# Patient Record
Sex: Male | Born: 1965
Health system: Southern US, Community
[De-identification: ages and names within clinical notes are randomized; demographics above are authoritative.]

## PROBLEM LIST (undated history)

## (undated) DIAGNOSIS — I10 Essential (primary) hypertension: Secondary | ICD-10-CM

## (undated) DIAGNOSIS — Z951 Presence of aortocoronary bypass graft: Secondary | ICD-10-CM

## (undated) DIAGNOSIS — R748 Abnormal levels of other serum enzymes: Secondary | ICD-10-CM

## (undated) DIAGNOSIS — I2510 Atherosclerotic heart disease of native coronary artery without angina pectoris: Secondary | ICD-10-CM

## (undated) DIAGNOSIS — E785 Hyperlipidemia, unspecified: Secondary | ICD-10-CM

## (undated) DIAGNOSIS — R739 Hyperglycemia, unspecified: Secondary | ICD-10-CM

## (undated) DIAGNOSIS — J45909 Unspecified asthma, uncomplicated: Secondary | ICD-10-CM

## (undated) HISTORY — DX: Abnormal levels of other serum enzymes: R74.8

## (undated) HISTORY — PX: TONSILLECTOMY: SUR1361

## (undated) HISTORY — PX: VASECTOMY: SHX75

## (undated) HISTORY — PX: APPENDECTOMY: SHX54

## (undated) HISTORY — DX: Presence of aortocoronary bypass graft: Z95.1

## (undated) HISTORY — PX: WISDOM TOOTH EXTRACTION: SHX21

## (undated) HISTORY — DX: Hyperlipidemia, unspecified: E78.5

## (undated) HISTORY — DX: Hyperglycemia, unspecified: R73.9

---

## 2004-01-14 ENCOUNTER — Encounter: Admission: RE | Admit: 2004-01-14 | Discharge: 2004-01-14 | Payer: Self-pay | Admitting: Internal Medicine

## 2004-01-14 ENCOUNTER — Ambulatory Visit: Payer: Self-pay | Admitting: Internal Medicine

## 2005-01-12 ENCOUNTER — Ambulatory Visit: Payer: Self-pay | Admitting: Internal Medicine

## 2005-01-19 ENCOUNTER — Ambulatory Visit: Payer: Self-pay | Admitting: Internal Medicine

## 2008-10-10 ENCOUNTER — Encounter: Payer: Self-pay | Admitting: Internal Medicine

## 2008-12-05 ENCOUNTER — Ambulatory Visit: Payer: Self-pay | Admitting: Internal Medicine

## 2008-12-05 DIAGNOSIS — E7849 Other hyperlipidemia: Secondary | ICD-10-CM

## 2008-12-05 DIAGNOSIS — J45909 Unspecified asthma, uncomplicated: Secondary | ICD-10-CM

## 2008-12-05 HISTORY — DX: Unspecified asthma, uncomplicated: J45.909

## 2008-12-05 HISTORY — DX: Other hyperlipidemia: E78.49

## 2008-12-05 LAB — CONVERTED CEMR LAB
Cholesterol, target level: 200 mg/dL
HDL goal, serum: 40 mg/dL
LDL Goal: 110 mg/dL

## 2008-12-11 ENCOUNTER — Ambulatory Visit: Payer: Self-pay | Admitting: Internal Medicine

## 2008-12-11 LAB — CONVERTED CEMR LAB: Hgb A1c MFr Bld: 5.2 % (ref 4.6–6.5)

## 2008-12-12 ENCOUNTER — Encounter (INDEPENDENT_AMBULATORY_CARE_PROVIDER_SITE_OTHER): Payer: Self-pay | Admitting: *Deleted

## 2008-12-15 ENCOUNTER — Encounter: Payer: Self-pay | Admitting: Internal Medicine

## 2008-12-25 ENCOUNTER — Telehealth (INDEPENDENT_AMBULATORY_CARE_PROVIDER_SITE_OTHER): Payer: Self-pay | Admitting: *Deleted

## 2009-01-01 ENCOUNTER — Ambulatory Visit: Payer: Self-pay | Admitting: Internal Medicine

## 2009-01-01 LAB — CONVERTED CEMR LAB: LDL Goal: 135 mg/dL

## 2009-04-03 ENCOUNTER — Ambulatory Visit: Payer: Self-pay | Admitting: Internal Medicine

## 2009-04-06 LAB — CONVERTED CEMR LAB
ALT: 101 units/L — ABNORMAL HIGH (ref 0–53)
AST: 51 units/L — ABNORMAL HIGH (ref 0–37)
Albumin: 4.5 g/dL (ref 3.5–5.2)
Alkaline Phosphatase: 57 units/L (ref 39–117)
Bilirubin, Direct: 0.1 mg/dL (ref 0.0–0.3)
Cholesterol: 276 mg/dL — ABNORMAL HIGH (ref 0–200)
Direct LDL: 192.6 mg/dL
HDL: 59.3 mg/dL (ref >39.00)
Total Bilirubin: 1.2 mg/dL (ref 0.3–1.2)
Total CHOL/HDL Ratio: 5
Total Protein: 7.5 g/dL (ref 6.0–8.3)
Triglycerides: 174 mg/dL — ABNORMAL HIGH (ref 0.0–149.0)
VLDL: 34.8 mg/dL (ref 0.0–40.0)

## 2009-04-10 ENCOUNTER — Ambulatory Visit: Payer: Self-pay | Admitting: Internal Medicine

## 2009-04-10 DIAGNOSIS — R740 Nonspecific elevation of levels of transaminase and lactic acid dehydrogenase [LDH]: Secondary | ICD-10-CM

## 2009-05-11 ENCOUNTER — Telehealth (INDEPENDENT_AMBULATORY_CARE_PROVIDER_SITE_OTHER): Payer: Self-pay | Admitting: *Deleted

## 2009-05-27 ENCOUNTER — Encounter: Payer: Self-pay | Admitting: Internal Medicine

## 2009-05-28 ENCOUNTER — Telehealth (INDEPENDENT_AMBULATORY_CARE_PROVIDER_SITE_OTHER): Payer: Self-pay | Admitting: *Deleted

## 2010-04-06 NOTE — Progress Notes (Signed)
Summary: -refill  Phone Note Call from Patient   Caller: Patient email Summary of Call: Email states: dr hopper will you issue a 90-day prescription to [facility] for singular please? Local pharmacy charging 39 per month and can save a few bucks with mail order.  per dr hopper singulair 10 mg #90 3 refills  Follow-up for Phone Call        left message to call  office..........Marland KitchenFelecia Deloach CMA  May 11, 2009 9:41 AM  left message to call  office............Marland KitchenFelecia Deloach CMA  May 11, 2009 3:07 PM  left message to call  office.................Marland KitchenFelecia Deloach CMA  May 12, 2009 8:25 AM   pt called back awaiting return call to verify pharmacy..........Marland KitchenFelecia Deloach CMA  May 13, 2009 11:19 AM                   Additional Follow-up for Phone Call Additional follow up Details #1::        pt called back to [facility]pt aware rx sent to pharmacy..............Marland KitchenFelecia Deloach CMA  May 13, 2009 2:56 PM     New/Updated Medications: SINGULAIR 10 MG TABS (MONTELUKAST SODIUM) take 1 tab once daily Prescriptions: SINGULAIR 10 MG TABS (MONTELUKAST SODIUM) take 1 tab once daily  #90 x 3   Entered by:   Jeremy Johann CMA   Authorized by:   Marga Melnick MD   Signed by:   Jeremy Johann CMA on 05/13/2009   Method used:   Faxed to ...       [facility] Environmental education officer)       [address] [address]       [place], [place]  [address]       Ph: [phone]       Fax: [phone]   RxID:   [id number]

## 2010-04-06 NOTE — Assessment & Plan Note (Signed)
Summary: to go over labs/kdc   Vital Signs:  Patient profile:   45 year old male Weight:      192.6 pounds Pulse rate:   72 / minute Resp:     12 per minute BP sitting:   120 / 88  (left arm) Cuff size:   large  Vitals Entered By: Shonna Chock (April 10, 2009 2:47 PM) CC: Follow-up visit: discuss labs  Comments REVIEWED MED LIST, PATIENT AGREED DOSE AND INSTRUCTION CORRECT    CC:  Follow-up visit: discuss labs .  History of Present Illness: Labs reviewed & risks discussed; TG 174 , AST 51, ALT 101, & LDL 192.6. No specific diet; decreased CVE. No PMH of hepatitis. No excess Tylenol  or vitamin A. 4 beers/ day.  Allergies (verified): No Known Drug Allergies  Past History:  Past Medical History: Hyperlipidemia: NMR: LDL 161(0960/454), TG 97, HDL 63. LDL goal = < 135, < 100 ideally Fasting hyperglycemia  Asthma, PMHN of  Review of Systems CV:  Denies chest pain or discomfort, leg cramps with exertion, palpitations, and shortness of breath with exertion. GI:  Denies yellowish skin color. GU:  No coke colored coke.  Physical Exam  General:  well-nourished,alert,appropriate and cooperative throughout examination Eyes:  No  icterus Heart:  Normal rate and regular rhythm. S1 and S2 normal without gallop, murmur, click, rub. S4 with slurring Abdomen:  Bowel sounds positive,abdomen soft and non-tender without masses, organomegaly or hernias noted. Pulses:  R and L carotid,radial,dorsalis pedis and posterior tibial pulses are full and equal bilaterally Skin:  No jaundice   Impression & Recommendations:  Problem # 1:  HYPERLIPIDEMIA (ICD-272.4)  The following medications were removed from the medication list:    Pravastatin Sodium 20 Mg Tabs (Pravastatin sodium) .Marland Kitchen... 1 at bedtime  Problem # 2:  NONSPEC ELEVATION OF LEVELS OF TRANSAMINASE/LDH (ICD-790.4)  Patient Instructions: 1)  Consume LESS THAN 40 grams of sugar / day from foods & drinks with High Fructose Corn  Syrup as #1,2 or #3 on label. 2)  Please schedule a follow-up  fasting lab appointment in 4 months. 3)  Hepatic Panel ;Lipid Panel . LDL goal = < 135

## 2010-04-06 NOTE — Progress Notes (Signed)
Summary: prior auth APPROVED [facility]  Phone Note Refill Request Message from:  Pharmacy  Refills Requested: Medication #1:  SINGULAIR 10 MG TABS take 1 tab once daily. prior auth [id number]..............Marland KitchenFelecia Deloach CMA  May 28, 2009 9:07 AM  prior auth approved 05-27-09 until 05-27-10, approval letter scan to chart...............Marland KitchenFelecia Deloach CMA  May 28, 2009 9:08 AM

## 2010-04-06 NOTE — Medication Information (Signed)
Summary: Approval for Singulair/[facility]  Approval for Singulair/[facility]   Imported By: Lanelle Bal 06/06/2009 11:20:27  _____________________________________________________________________  External Attachment:    Type:   Image     Comment:   External Document

## 2011-01-05 ENCOUNTER — Encounter: Payer: Self-pay | Admitting: Internal Medicine

## 2011-01-06 ENCOUNTER — Encounter: Payer: Self-pay | Admitting: Internal Medicine

## 2011-01-06 ENCOUNTER — Ambulatory Visit (INDEPENDENT_AMBULATORY_CARE_PROVIDER_SITE_OTHER): Payer: 59 | Admitting: Internal Medicine

## 2011-01-06 VITALS — BP 122/84 | HR 85 | Temp 98.2°F | Resp 14 | Ht 73.0 in | Wt 178.1 lb

## 2011-01-06 DIAGNOSIS — J45909 Unspecified asthma, uncomplicated: Secondary | ICD-10-CM

## 2011-01-06 DIAGNOSIS — Z23 Encounter for immunization: Secondary | ICD-10-CM

## 2011-01-06 DIAGNOSIS — E785 Hyperlipidemia, unspecified: Secondary | ICD-10-CM

## 2011-01-06 DIAGNOSIS — Z136 Encounter for screening for cardiovascular disorders: Secondary | ICD-10-CM

## 2011-01-06 DIAGNOSIS — Z0000 Encounter for general adult medical examination without abnormal findings: Secondary | ICD-10-CM

## 2011-01-06 LAB — CBC WITH DIFFERENTIAL/PLATELET
Basophils Absolute: 0.1 10*3/uL (ref 0.0–0.1)
Basophils Relative: 1 % (ref 0.0–3.0)
Eosinophils Absolute: 0.1 10*3/uL (ref 0.0–0.7)
Eosinophils Relative: 1.7 % (ref 0.0–5.0)
HCT: 48.2 % (ref 39.0–52.0)
Hemoglobin: 16.6 g/dL (ref 13.0–17.0)
Lymphocytes Relative: 25.2 % (ref 12.0–46.0)
Lymphs Abs: 1.7 10*3/uL (ref 0.7–4.0)
MCHC: 34.5 g/dL (ref 30.0–36.0)
MCV: 95.1 fl (ref 78.0–100.0)
Monocytes Absolute: 0.4 10*3/uL (ref 0.1–1.0)
Monocytes Relative: 6.4 % (ref 3.0–12.0)
Neutro Abs: 4.5 10*3/uL (ref 1.4–7.7)
Neutrophils Relative %: 65.7 % (ref 43.0–77.0)
Platelets: 252 10*3/uL (ref 150.0–400.0)
RBC: 5.07 Mil/uL (ref 4.22–5.81)
RDW: 12.8 % (ref 11.5–14.6)
WBC: 6.9 10*3/uL (ref 4.5–10.5)

## 2011-01-06 LAB — HEPATIC FUNCTION PANEL
ALT: 34 U/L (ref 0–53)
AST: 28 U/L (ref 0–37)
Albumin: 4.8 g/dL (ref 3.5–5.2)
Alkaline Phosphatase: 64 U/L (ref 39–117)
Bilirubin, Direct: 0 mg/dL (ref 0.0–0.3)
Total Bilirubin: 1.2 mg/dL (ref 0.3–1.2)
Total Protein: 8 g/dL (ref 6.0–8.3)

## 2011-01-06 LAB — BASIC METABOLIC PANEL
BUN: 10 mg/dL (ref 6–23)
CO2: 24 mEq/L (ref 19–32)
Calcium: 9.4 mg/dL (ref 8.4–10.5)
Chloride: 104 mEq/L (ref 96–112)
Creatinine, Ser: 0.7 mg/dL (ref 0.4–1.5)
GFR: 129.7 mL/min (ref >60.00)
Glucose, Bld: 92 mg/dL (ref 70–99)
Potassium: 4 mEq/L (ref 3.5–5.1)
Sodium: 139 mEq/L (ref 135–145)

## 2011-01-06 LAB — TSH: TSH: 0.91 u[IU]/mL (ref 0.35–5.50)

## 2011-01-06 NOTE — Progress Notes (Signed)
Subjective:    Patient ID: Tim Strickland, male    DOB: 30-Apr-1965, 45 y.o.   MRN: [id number]  HPI  Tim Strickland  is here for a physical; he has no acute issues.     Review of Systems Dyslipidemia assessment: Prior Advanced Lipid Testing: NMR Lipoprofile.   Family history of premature CAD/ MI: Father & PGF .  Nutrition: heart healthy .  Exercise: not much . Diabetes : no but sister type 1 DM . HTN: no. Smoking history  : no .   Weight :  Down 10 # w/o intervention.  He previously was on statins but these were stopped, presumably because of mild hepatic enzyme rise. It is his impression that his cholesterol actually rose while on a statin ROS: fatigue: no ; chest pain : no ;claudication: no; palpitations: no; abd pain/bowel changes: no ; myalgias:no;  syncope : no ; skin/ hair/ nail  changes: no.  He has a past history of asthma related to environmental exposures. This is quiescent; he uses no maintenance or rescue metered-dose inhalers. He simply avoids triggers which include paint fumes and dust.       Objective:   Physical Exam Gen.: Healthy and well-nourished in appearance. Alert, appropriate and cooperative throughout exam. Head: Normocephalic without obvious abnormalities Eyes: No corneal or conjunctival inflammation noted. Pupils equal round reactive to light and accommodation. Fundal exam is benign without hemorrhages, exudate, papilledema. Extraocular motion intact. Vision grossly normal. Ears: External  ear exam reveals no significant lesions or deformities. Canals clear .TMs normal. Hearing is grossly normal bilaterally. Nose: External nasal exam reveals no deformity or inflammation. Nasal mucosa are pink and moist. No lesions or exudates noted. Septum  Slightly deviated to R  Mouth: Oral mucosa and oropharynx reveal no lesions or exudates. Teeth in good repair. Neck: No deformities, masses, or tenderness noted. Range of motion &  Thyroid normal. Lungs: Normal respiratory  effort; chest expands symmetrically. Lungs are clear to auscultation without rales, wheezes, or increased work of breathing. Heart: Normal rate and rhythm. Normal S1 and S2. No gallop, click, or rub. No  murmur. Abdomen: Bowel sounds normal; abdomen soft and nontender. No masses, organomegaly or hernias noted. Genitalia/DRE: Varicocele is present on the left. Minimal asymmetry of the prostate is noted without nodularity, enlargement, or induration   .                                                                                   Musculoskeletal/extremities: No deformity or scoliosis noted of  the thoracic or lumbar spine. No clubbing, cyanosis, edema, or deformity noted. Range of motion  normal .Tone & strength  normal.Joints normal. Nail health  good. Vascular: Carotid, radial artery, dorsalis pedis and  posterior tibial pulses are full and equal. No bruits present. Neurologic: Alert and oriented x3. Deep tendon reflexes symmetrical and normal.         Skin: Intact without suspicious lesions or rashes. Lymph: No cervical, axillary, or inguinal lymphadenopathy present. Psych: Mood and affect are normal. Normally interactive  Assessment & Plan:  #1 comprehensive physical exam; no acute findings #2 see Problem List with Assessments & Recommendations  My major concern is the possible risk of premature coronary disease based on his family history and the NMR lipoprofile. Additional advanced testing is indicated to assess need for a statin and to define the optimal statin based on ability to metabolize such in view of the past history of elevated liver enzymes. Plan: see Orders

## 2011-01-06 NOTE — Patient Instructions (Signed)
Preventive Health Care: Exercise at least 30-45 minutes a day,  3-4 days a week.  Eat a low-fat diet with lots of fruits and vegetables, up to 7-9 servings per day. Consume less than 40 grams of sugar per day from foods & drinks with High Fructose Corn Sugar as # 1,2,3 or # 4 on label. Alcohol If you drink, do it moderately,less than 9 drinks per week, preferably less than 6 @ most. Health Care Power of Attorney & Living Will. Complete if not in place ; these place you in charge of your health care decisions. Please review Dr Gildardo Griffes book Eat, Drink & Be Healthy for dietary cholesterol information.

## 2011-07-01 ENCOUNTER — Ambulatory Visit (INDEPENDENT_AMBULATORY_CARE_PROVIDER_SITE_OTHER): Payer: 59 | Admitting: Internal Medicine

## 2011-07-01 ENCOUNTER — Encounter: Payer: Self-pay | Admitting: Internal Medicine

## 2011-07-01 VITALS — BP 126/88 | HR 75 | Wt 173.0 lb

## 2011-07-01 DIAGNOSIS — Z8249 Family history of ischemic heart disease and other diseases of the circulatory system: Secondary | ICD-10-CM

## 2011-07-01 DIAGNOSIS — R7401 Elevation of levels of liver transaminase levels: Secondary | ICD-10-CM

## 2011-07-01 DIAGNOSIS — E785 Hyperlipidemia, unspecified: Secondary | ICD-10-CM

## 2011-07-01 MED ORDER — ROSUVASTATIN CALCIUM 10 MG PO TABS: ORAL_TABLET | ORAL | Status: DC

## 2011-07-01 NOTE — Patient Instructions (Signed)
Please take enteric-coated aspirin 325 mg daily with breakfast. Please  schedule fasting Labs in 10 weeks : Lipids, hepatic panel. PLEASE BRING THESE INSTRUCTIONS TO FOLLOW UP  LAB APPOINTMENT.This will guarantee correct labs are drawn, eliminating need for repeat blood sampling ( needle sticks ! ). Diagnoses /Codes: 272.4, 995.20

## 2011-07-01 NOTE — Progress Notes (Signed)
  Subjective:    Patient ID: Tim Strickland, male    DOB: 10-Jun-1965, 46 y.o.   MRN: [id number]  HPI His advanced cholesterol panels were reviewed (see Problem List). They suggested his LDL goal should be in the range of 110, ideally less than 80. The genetic testing suggests that he is an intermediate metabolizer. By history he had elevated liver enzymes while on pravastatin at low dose.  Family history is significant for premature coronary disease  He is not engaged in a regular exercise program but is physically active on his job.    Review of Systems  He denies chest pain, palpitations, dyspnea, or claudication.     Objective:   Physical Exam He appears healthy and well-nourished; he is in no acute distress  No carotid bruits are present.  Heart rhythm and rate are normal with no significant murmurs or gallops. S 4  Chest is clear with no increased work of breathing  There is no evidence of aortic aneurysm or renal artery bruits  He has no clubbing or edema.   Pedal pulses are intact   No ischemic skin changes are present         Assessment & Plan:

## 2011-07-01 NOTE — Assessment & Plan Note (Signed)
AST/ALT will be checked. He'll be given a trial of Crestor 10 mg Monday, Wednesday, & Friday with repeat fasting labs in 10 weeks. Aspirin will be recommended. Dr Darnelle Maffucci book was also recommended

## 2011-07-15 ENCOUNTER — Ambulatory Visit (INDEPENDENT_AMBULATORY_CARE_PROVIDER_SITE_OTHER): Payer: 59 | Admitting: Internal Medicine

## 2011-07-15 VITALS — BP 110/74 | HR 72 | Temp 98.0°F | Wt 173.0 lb

## 2011-07-15 DIAGNOSIS — L259 Unspecified contact dermatitis, unspecified cause: Secondary | ICD-10-CM

## 2011-07-15 MED ORDER — PREDNISONE 10 MG PO TABS: ORAL_TABLET | ORAL | Status: DC

## 2011-07-15 NOTE — Assessment & Plan Note (Addendum)
Symptoms most likely due to contact dermatitis. He started Crestor around 2 weeks ago however the symptoms are not consistent with a drug eruption.  Recommend to wash all his clothing, etc See instructions.

## 2011-07-15 NOTE — Patient Instructions (Signed)
Prednisone as prescribed Zyrtec 10 mg over-the-counter one daily. Call anytime if the rash spreads or your eye is affected. Over-the-counter calamine is ok, don't let it close to your eye Poison [facility] ivy is a inflammation of the skin (contact dermatitis) caused by touching the allergens on the leaves of the ivy plant following previous exposure to the plant. The rash usually appears 48 hours after exposure. The rash is usually bumps (papules) or blisters (vesicles) in a linear pattern. Depending on your own sensitivity, the rash may simply cause redness and itching, or it may also progress to blisters which may break open. These must be well cared for to prevent secondary bacterial (germ) infection, followed by scarring. Keep any open areas dry, clean, dressed, and covered with an antibacterial ointment if needed. The eyes may also get puffy. The puffiness is worst in the morning and gets better as the day progresses. This dermatitis usually heals without scarring, within 2 to 3 weeks without treatment. HOME CARE INSTRUCTIONS  Thoroughly wash with soap and water as soon as you have been exposed to poison ivy. You have about one half hour to remove the plant resin before it will cause the rash. This washing will destroy the oil or antigen on the skin that is causing, or will cause, the rash. Be sure to wash under your fingernails as any plant resin there will continue to spread the rash. Do not rub skin vigorously when washing affected area. Poison ivy cannot spread if no oil from the plant remains on your body. A rash that has progressed to weeping sores will not spread the rash unless you have not washed thoroughly. It is also important to wash any clothes you have been wearing as these may carry active allergens. The rash will return if you wear the unwashed clothing, even several days later. Avoidance of the plant in the future is the best measure. Poison ivy plant can be recognized by the number of  leaves. Generally, poison ivy has three leaves with flowering branches on a single stem. Diphenhydramine may be purchased over the counter and used as needed for itching. Do not drive with this medication if it makes you drowsy.Ask your caregiver about medication for children. SEEK MEDICAL CARE IF:  Open sores develop.   Redness spreads beyond area of rash.   You notice purulent (pus-like) discharge.   You have increased pain.   Other signs of infection develop (such as fever).  Document Released: 02/19/2000 Document Revised: 02/10/2011 Document Reviewed: 01/07/2009 [facility] Patient Information 2012 [place], [place].

## 2011-07-15 NOTE — Progress Notes (Signed)
  Subjective:    Patient ID: Tim Strickland, male    DOB: 04-12-1965, 46 y.o.   MRN: [id number]  HPI Acute visit Shortly after he work in the yard he developed a itchy rash of the hands and face. History of poison ivy before, he thinks that what it is. Symptoms started 2 nights ago.  Past Medical History  Diagnosis Date  . Asthma   . Hyperlipidemia   . Hyperglycemia     A1c 5.2% in 2010  . Elevated liver enzymes     PMH of     Review of Systems He is taking Crestor, he started 2 weeks ago.  Denies any fever chills Lips and tongue  are not affected. He in general feels well.     Objective:   Physical Exam  Constitutional: He appears well-developed and well-nourished. No distress.  HENT:  Head:    Skin: He is not diaphoretic.      Areas of macular  Erythema, see graphic. The most affected area is the left face. Eye itself does not seem affected     Assessment & Plan:

## 2011-07-17 ENCOUNTER — Encounter: Payer: Self-pay | Admitting: Internal Medicine

## 2011-08-09 ENCOUNTER — Encounter: Payer: Self-pay | Admitting: Internal Medicine

## 2011-09-29 ENCOUNTER — Other Ambulatory Visit (INDEPENDENT_AMBULATORY_CARE_PROVIDER_SITE_OTHER): Payer: 59

## 2011-09-29 DIAGNOSIS — E785 Hyperlipidemia, unspecified: Secondary | ICD-10-CM

## 2011-09-29 DIAGNOSIS — T887XXA Unspecified adverse effect of drug or medicament, initial encounter: Secondary | ICD-10-CM

## 2011-09-29 LAB — LIPID PANEL
Cholesterol: 222 mg/dL — ABNORMAL HIGH (ref 0–200)
HDL: 63.2 mg/dL (ref >39.00)
Total CHOL/HDL Ratio: 4
Triglycerides: 161 mg/dL — ABNORMAL HIGH (ref 0.0–149.0)
VLDL: 32.2 mg/dL (ref 0.0–40.0)

## 2011-09-29 LAB — HEPATIC FUNCTION PANEL
ALT: 28 U/L (ref 0–53)
AST: 23 U/L (ref 0–37)
Albumin: 4.5 g/dL (ref 3.5–5.2)
Alkaline Phosphatase: 55 U/L (ref 39–117)
Bilirubin, Direct: 0 mg/dL (ref 0.0–0.3)
Total Bilirubin: 0.7 mg/dL (ref 0.3–1.2)
Total Protein: 7.4 g/dL (ref 6.0–8.3)

## 2011-09-29 LAB — LDL CHOLESTEROL, DIRECT: Direct LDL: 135.3 mg/dL

## 2011-09-29 NOTE — Progress Notes (Signed)
Labs only

## 2011-12-20 ENCOUNTER — Other Ambulatory Visit: Payer: Self-pay

## 2011-12-20 MED ORDER — ALBUTEROL SULFATE HFA 108 (90 BASE) MCG/ACT IN AERS: 2.0000 | INHALATION_SPRAY | Freq: Four times a day (QID) | RESPIRATORY_TRACT | Status: DC | PRN

## 2011-12-20 MED ORDER — MONTELUKAST SODIUM 10 MG PO TABS: 10.0000 mg | ORAL_TABLET | Freq: Every day | ORAL | Status: DC

## 2011-12-20 NOTE — Telephone Encounter (Signed)
Both OK x 6 months

## 2011-12-20 NOTE — Telephone Encounter (Signed)
OV 07/15/11. Both meds are historical meds don't see that you filled before. Plz advise     MW

## 2012-12-24 ENCOUNTER — Telehealth: Payer: Self-pay

## 2012-12-24 NOTE — Telephone Encounter (Signed)
Medication List and allergies: reviewed  90 day supply/mail order: none Local prescriptions: [facility] [address] [facility] [address]  Immunizations due: Needs flu and tdap vaccines; admin bohth  A/P:   No famliy hx or surgical hx changes Will fast for lab work   To Discuss with Provider: Nothing at this time

## 2012-12-26 ENCOUNTER — Encounter: Payer: Self-pay | Admitting: Internal Medicine

## 2012-12-26 ENCOUNTER — Ambulatory Visit (INDEPENDENT_AMBULATORY_CARE_PROVIDER_SITE_OTHER): Payer: 59 | Admitting: Internal Medicine

## 2012-12-26 VITALS — BP 117/85 | HR 76 | Temp 97.8°F | Resp 16 | Ht 74.0 in | Wt 183.4 lb

## 2012-12-26 DIAGNOSIS — E785 Hyperlipidemia, unspecified: Secondary | ICD-10-CM

## 2012-12-26 DIAGNOSIS — Z0000 Encounter for general adult medical examination without abnormal findings: Secondary | ICD-10-CM

## 2012-12-26 DIAGNOSIS — Z23 Encounter for immunization: Secondary | ICD-10-CM

## 2012-12-26 LAB — TSH: TSH: 1.2 u[IU]/mL (ref 0.35–5.50)

## 2012-12-26 LAB — BASIC METABOLIC PANEL
BUN: 12 mg/dL (ref 6–23)
CO2: 28 mEq/L (ref 19–32)
Calcium: 9.4 mg/dL (ref 8.4–10.5)
Chloride: 101 mEq/L (ref 96–112)
Creatinine, Ser: 1 mg/dL (ref 0.4–1.5)
GFR: 89.3 mL/min (ref >60.00)
Glucose, Bld: 100 mg/dL — ABNORMAL HIGH (ref 70–99)
Potassium: 4.5 mEq/L (ref 3.5–5.1)
Sodium: 138 mEq/L (ref 135–145)

## 2012-12-26 LAB — LIPID PANEL
Cholesterol: 286 mg/dL — ABNORMAL HIGH (ref 0–200)
HDL: 63.3 mg/dL (ref >39.00)
Total CHOL/HDL Ratio: 5
Triglycerides: 168 mg/dL — ABNORMAL HIGH (ref 0.0–149.0)
VLDL: 33.6 mg/dL (ref 0.0–40.0)

## 2012-12-26 LAB — CBC WITH DIFFERENTIAL/PLATELET
Basophils Absolute: 0.1 10*3/uL (ref 0.0–0.1)
Basophils Relative: 1 % (ref 0.0–3.0)
Eosinophils Absolute: 0.2 10*3/uL (ref 0.0–0.7)
Eosinophils Relative: 2.6 % (ref 0.0–5.0)
HCT: 46.8 % (ref 39.0–52.0)
Hemoglobin: 16.3 g/dL (ref 13.0–17.0)
Lymphocytes Relative: 24.4 % (ref 12.0–46.0)
Lymphs Abs: 1.9 10*3/uL (ref 0.7–4.0)
MCHC: 35 g/dL (ref 30.0–36.0)
MCV: 92.5 fl (ref 78.0–100.0)
Monocytes Absolute: 0.6 10*3/uL (ref 0.1–1.0)
Monocytes Relative: 7.7 % (ref 3.0–12.0)
Neutro Abs: 5.1 10*3/uL (ref 1.4–7.7)
Neutrophils Relative %: 64.3 % (ref 43.0–77.0)
Platelets: 260 10*3/uL (ref 150.0–400.0)
RBC: 5.06 Mil/uL (ref 4.22–5.81)
RDW: 13.2 % (ref 11.5–14.6)
WBC: 7.9 10*3/uL (ref 4.5–10.5)

## 2012-12-26 LAB — HEPATIC FUNCTION PANEL
ALT: 74 U/L — ABNORMAL HIGH (ref 0–53)
AST: 44 U/L — ABNORMAL HIGH (ref 0–37)
Albumin: 4.3 g/dL (ref 3.5–5.2)
Alkaline Phosphatase: 67 U/L (ref 39–117)
Bilirubin, Direct: 0 mg/dL (ref 0.0–0.3)
Total Bilirubin: 1.2 mg/dL (ref 0.3–1.2)
Total Protein: 7.6 g/dL (ref 6.0–8.3)

## 2012-12-26 LAB — LDL CHOLESTEROL, DIRECT: Direct LDL: 205.1 mg/dL

## 2012-12-26 MED ORDER — MONTELUKAST SODIUM 10 MG PO TABS: 10.0000 mg | ORAL_TABLET | Freq: Every day | ORAL | Status: DC

## 2012-12-26 MED ORDER — ALBUTEROL SULFATE HFA 108 (90 BASE) MCG/ACT IN AERS: 2.0000 | INHALATION_SPRAY | Freq: Four times a day (QID) | RESPIRATORY_TRACT | Status: DC | PRN

## 2012-12-26 NOTE — Patient Instructions (Signed)
Preventive Health Care: Exercise at least 30-45 minutes a day,  3-4 days a week.  Eat a low-fat diet with lots of fruits and vegetables, up to 7-9 servings per day. This would eliminate the need for vitamin supplements. Alcohol If you drink, do it moderately,less than 9 drinks per week, preferably less than 6 @ most. Health Care Power of Attorney & Living Will. Complete these if not in place ; these place you in charge of your health care decisions. Please take enteric-coated aspirin 81 mg daily with breakfast. Use an anti-inflammatory cream such as Aspercreme or Zostrix cream twice a day to the affected area as needed. In lieu of this warm moist compresses or  hot water bottle can be used. Do not apply ice .  If you activate the  My Chart system; lab & Xray results will be released directly  to you as soon as I review & address these through the computer. If you choose not to sign up for My Chart within 36 hours of labs being drawn; results will be reviewed & interpretation added before being copied & mailed, causing a delay in getting the results to you.If you do not receive that report within 7-10 days ,please call. Additionally you can use this system to gain direct  access to your records  if  out of town or @ an office of a  physician who is not in  the My Chart network.  This improves continuity of care & places you in control of your medical record.

## 2012-12-26 NOTE — Progress Notes (Signed)
  Subjective:    Patient ID: Tim Strickland, male    DOB: 20-Oct-1965, 47 y.o.   MRN: [id number]  HPI  He is here for a physical;acute issues intermittent L index finger with weather change. As needed NSAID helped.     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 Specifically he denies chest pain, palpitations, dyspnea, or claudication.  Family history is positive for premature coronary disease. Advanced cholesterol testing reveals his LDL goal is less than 110. There is medication compliance with the statin. Significant abdominal symptoms, memory deficit, or myalgias denied.    Objective:   Physical Exam Gen.: Healthy and well-nourished in appearance. Alert, appropriate and cooperative throughout exam.Appears younger than stated age  Head: Normocephalic without obvious abnormalities; no alopecia  Eyes: No corneal or conjunctival inflammation noted. Pupils equal round reactive to light and accommodation. Fundal exam is benign without hemorrhages, exudate, papilledema. Extraocular motion intact. Ears: External  ear exam reveals no significant lesions or deformities. Canals clear .TMs normal.   Nose: External nasal exam reveals no deformity or inflammation. Nasal mucosa are pink and moist. No lesions or exudates noted.  Mouth: Oral mucosa and oropharynx reveal no lesions or exudates. Teeth in good repair. Neck: No deformities, masses, or tenderness noted. Range of motion & Thyroid normal. Lungs: Normal respiratory effort; chest expands symmetrically. Lungs are clear to auscultation without rales, wheezes, or increased work of breathing. Heart: Normal rate and rhythm. Normal S1 and S2. No gallop, click, or rub. No  murmur. Abdomen: Bowel sounds normal; abdomen soft and nontender. No masses, organomegaly or hernias noted. Genitalia: Genitalia normal except for left varices. Prostate is normal without enlargement, asymmetry,  nodularity, or induration.                                  Musculoskeletal/extremities: No deformity or scoliosis noted of  the thoracic or lumbar spine.  No clubbing, cyanosis, edema, or significant extremity  deformity noted. Range of motion normal .Tone & strength  Normal. Joints  reveal minor DJD DIP changes. Nail health good. Able to lie down & sit up w/o help. Negative SLR bilaterally Vascular: Carotid, radial artery, dorsalis pedis and  posterior tibial pulses are full and equal. No bruits present. Neurologic: Alert and oriented x3. Deep tendon reflexes symmetrical and normal.  Gait normal  including heel & toe walking .        Skin: Intact without suspicious lesions or rashes. Lymph: No cervical, axillary, or inguinal lymphadenopathy present. Psych: Mood and affect are normal. Normally interactive                                                                                        Assessment & Plan:  #1 comprehensive physical exam; no acute findings  Plan: see Orders  & Recommendations

## 2012-12-28 ENCOUNTER — Encounter: Payer: Self-pay | Admitting: General Practice

## 2012-12-28 ENCOUNTER — Other Ambulatory Visit: Payer: Self-pay | Admitting: Internal Medicine

## 2012-12-28 DIAGNOSIS — E785 Hyperlipidemia, unspecified: Secondary | ICD-10-CM

## 2013-01-02 ENCOUNTER — Ambulatory Visit: Payer: 59

## 2013-01-02 DIAGNOSIS — R7309 Other abnormal glucose: Secondary | ICD-10-CM

## 2013-01-02 LAB — HEMOGLOBIN A1C: Hgb A1c MFr Bld: 5.4 % (ref 4.6–6.5)

## 2013-01-03 ENCOUNTER — Encounter: Payer: Self-pay | Admitting: *Deleted

## 2013-01-03 NOTE — Progress Notes (Signed)
Letter mailed to patient.

## 2014-01-07 ENCOUNTER — Encounter: Payer: Self-pay | Admitting: Internal Medicine

## 2014-01-07 ENCOUNTER — Ambulatory Visit (INDEPENDENT_AMBULATORY_CARE_PROVIDER_SITE_OTHER): Payer: 59 | Admitting: Internal Medicine

## 2014-01-07 VITALS — BP 130/100 | HR 83 | Temp 97.6°F | Resp 12 | Ht 73.0 in | Wt 187.1 lb

## 2014-01-07 DIAGNOSIS — S3992XD Unspecified injury of lower back, subsequent encounter: Secondary | ICD-10-CM

## 2014-01-07 DIAGNOSIS — Z23 Encounter for immunization: Secondary | ICD-10-CM

## 2014-01-07 DIAGNOSIS — W19XXXD Unspecified fall, subsequent encounter: Secondary | ICD-10-CM

## 2014-01-07 DIAGNOSIS — Z0189 Encounter for other specified special examinations: Secondary | ICD-10-CM

## 2014-01-07 DIAGNOSIS — E785 Hyperlipidemia, unspecified: Secondary | ICD-10-CM

## 2014-01-07 DIAGNOSIS — M7541 Impingement syndrome of right shoulder: Secondary | ICD-10-CM

## 2014-01-07 DIAGNOSIS — Z0000 Encounter for general adult medical examination without abnormal findings: Secondary | ICD-10-CM

## 2014-01-07 MED ORDER — CYCLOBENZAPRINE HCL 5 MG PO TABS: ORAL_TABLET | ORAL | Status: DC

## 2014-01-07 MED ORDER — PREDNISONE 20 MG PO TABS: 20.0000 mg | ORAL_TABLET | Freq: Two times a day (BID) | ORAL | Status: DC

## 2014-01-07 NOTE — Patient Instructions (Addendum)
Your next office appointment will be determined based upon review of your pending labs. Those instructions will be transmitted to you through My Chart  OR  by mail;whichever process is your choice to receive results & recommendations  Followup as needed for your acute issue. Please report any significant change in your symptoms.The  referral to Dr Tamala Julian will be scheduled and you'll be notified of the time.Please call the Referral Co-Ordinator @ [phone] if you have not been notified of appointment time within 7-10 days.  Use an anti-inflammatory cream such as Aspercreme or Zostrix cream twice a day to the affected area as needed. In lieu of this warm moist compresses or  hot water bottle can be used. Do not apply ice .

## 2014-01-07 NOTE — Progress Notes (Signed)
Pre visit review using our clinic review tool, if applicable. No additional management support is needed unless otherwise documented below in the visit note. 

## 2014-01-07 NOTE — Progress Notes (Signed)
Subjective:    Patient ID: Tim Strickland, male    DOB: 05/30/1965, 48 y.o.   MRN: [id number]  HPI   He is here for a physical;acute issues include acute mid back and somewhat chronic right shoulder discomfort  On 01/03/14 he tripped over a root while hiking and struck his back on a rock.  Denied were any change in heart rhythm or rate prior to the event. There was no associated chest pain or shortness of breath prior to fall. Also specifically denied prior to the episode were headache, limb weakness, tingling, or numbness. No seizure activity noted. X-rays were performed 01/04/14 @ an urgent care near [place], [place]. No fractures were present. He was placed on a muscle relaxant and combination tramadol and acetaminophen. He has residual muscle spasm. He has no limb numbness, tingling, weakness. He has no loss of control of bladder or bowels.  For the past 6 months he's had decreased lateral elevation of the right shoulder. He notes some crepitus at times intermittently. There was no specific trigger or  injury. This has not been evaluated.  He has a heart healthy diet. He's physically active in [employer] and also hikes. He has no associated cardiopulmonary symptoms with this.      Review of Systems    Chest pain, palpitations, tachycardia, exertional dyspnea, paroxysmal nocturnal dyspnea, claudication or edema are absent.    There is no significant abdominal symptom, memory loss, or muscle pain of the acute back issues       Objective:   Physical Exam     Pertinent or positive findings include: He exhibits the classic "low back crawl" lying down and sitting up from the exam table. He has discomfort in the left inferior thorax in the axillary line as well as posteriorly with chest compression side-to-side. There is a faint ecchymotic area over the left posterior chest. He does tend to splint some with deep inspirations. He is able to fully raise both upper extremities to  parallel the head neck.  Gen.: Healthy and well-nourished in appearance. Alert, appropriate and cooperative throughout exam. Appears younger than stated age  Head: Normocephalic without obvious abnormalities  Eyes: No corneal or conjunctival inflammation noted. Pupils equal round reactive to light and accommodation. Extraocular motion intact. Ears: External  ear exam reveals no significant lesions or deformities. Canals clear .TMs normal. Hearing is grossly normal bilaterally. Nose: External nasal exam reveals no deformity or inflammation. Nasal mucosa are pink and moist. No lesions or exudates noted.   Mouth: Oral mucosa and oropharynx reveal no lesions or exudates. Teeth in good repair. Neck: No deformities, masses, or tenderness noted. Range of motion & Thyroid normal. Lungs: Normal respiratory effort; chest expands symmetrically. Lungs are clear to auscultation without rales, wheezes, or increased work of breathing. Heart: Normal rate and rhythm. Normal S1 and S2. No gallop, click, or rub. No murmur. Abdomen: Bowel sounds normal; abdomen soft and nontender. No masses, organomegaly or hernias noted. Genitalia: Genitalia normal except for bilateral vasectomy scar tissue &  left varices. Prostate is normal without enlargement, asymmetry, nodularity, or induration   Musculoskeletal/extremities: No deformity or scoliosis noted of  the thoracic or lumbar spine.  No clubbing, cyanosis, edema, or significant extremity  deformity noted. Range of motion normal but subjective pain with lateral elevation of RUE.Tone & strength normal. Hand joints normal  Fingernail / toenail health good. Able to lie down & sit up w/o help. Negative SLR bilaterally Vascular: Carotid, radial artery, dorsalis pedis  and  posterior tibial pulses are full and equal. No bruits present. Neurologic: Alert and oriented x3. Deep tendon reflexes symmetrical and normal.  Gait normal  Skin: Intact without suspicious lesions or  rashes. Lymph: No cervical, axillary, or inguinal lymphadenopathy present. Psych: Mood and affect are normal. Normally interactive                                                                                        Assessment & Plan:  #1 comprehensive physical exam #2 acute posterior thoracic muscle skeletal injury in fall  #3 right shoulder impingement syndrome without clinical suspicion for rotator cuff tear  Plan: see Orders  & Recommendations

## 2014-01-10 ENCOUNTER — Encounter: Payer: Self-pay | Admitting: Internal Medicine

## 2014-01-12 ENCOUNTER — Encounter: Payer: Self-pay | Admitting: Internal Medicine

## 2014-01-12 ENCOUNTER — Other Ambulatory Visit: Payer: Self-pay | Admitting: Internal Medicine

## 2014-01-12 DIAGNOSIS — J90 Pleural effusion, not elsewhere classified: Secondary | ICD-10-CM

## 2014-01-15 ENCOUNTER — Other Ambulatory Visit (INDEPENDENT_AMBULATORY_CARE_PROVIDER_SITE_OTHER): Payer: 59

## 2014-01-15 ENCOUNTER — Encounter: Payer: Self-pay | Admitting: Family Medicine

## 2014-01-15 ENCOUNTER — Other Ambulatory Visit: Payer: Self-pay | Admitting: Internal Medicine

## 2014-01-15 ENCOUNTER — Ambulatory Visit (INDEPENDENT_AMBULATORY_CARE_PROVIDER_SITE_OTHER): Payer: 59 | Admitting: Family Medicine

## 2014-01-15 VITALS — BP 118/82 | HR 91 | Ht 73.0 in | Wt 185.0 lb

## 2014-01-15 DIAGNOSIS — M25511 Pain in right shoulder: Secondary | ICD-10-CM

## 2014-01-15 DIAGNOSIS — Z0189 Encounter for other specified special examinations: Secondary | ICD-10-CM

## 2014-01-15 DIAGNOSIS — M7551 Bursitis of right shoulder: Secondary | ICD-10-CM

## 2014-01-15 DIAGNOSIS — Z0000 Encounter for general adult medical examination without abnormal findings: Secondary | ICD-10-CM

## 2014-01-15 HISTORY — DX: Bursitis of right shoulder: M75.51

## 2014-01-15 LAB — CBC WITH DIFFERENTIAL/PLATELET
Basophils Absolute: 0.1 10*3/uL (ref 0.0–0.1)
Basophils Relative: 0.7 % (ref 0.0–3.0)
EOS PCT: 12 % — AB (ref 0.0–5.0)
Eosinophils Absolute: 2 10*3/uL — ABNORMAL HIGH (ref 0.0–0.7)
HCT: 51.8 % (ref 39.0–52.0)
Hemoglobin: 17.5 g/dL — ABNORMAL HIGH (ref 13.0–17.0)
Lymphocytes Relative: 25.1 % (ref 12.0–46.0)
Lymphs Abs: 4.2 10*3/uL — ABNORMAL HIGH (ref 0.7–4.0)
MCHC: 33.8 g/dL (ref 30.0–36.0)
MCV: 93.9 fl (ref 78.0–100.0)
MONO ABS: 1.1 10*3/uL — AB (ref 0.1–1.0)
Monocytes Relative: 6.7 % (ref 3.0–12.0)
NEUTROS PCT: 55.5 % (ref 43.0–77.0)
Neutro Abs: 9.3 10*3/uL — ABNORMAL HIGH (ref 1.4–7.7)
PLATELETS: 430 10*3/uL — AB (ref 150.0–400.0)
RBC: 5.51 Mil/uL (ref 4.22–5.81)
RDW: 13 % (ref 11.5–15.5)
WBC: 16.7 10*3/uL — AB (ref 4.0–10.5)

## 2014-01-15 LAB — BASIC METABOLIC PANEL
BUN: 19 mg/dL (ref 6–23)
CALCIUM: 9.1 mg/dL (ref 8.4–10.5)
CO2: 27 mEq/L (ref 19–32)
CREATININE: 1.1 mg/dL (ref 0.4–1.5)
Chloride: 100 mEq/L (ref 96–112)
GFR: 79.29 mL/min (ref >60.00)
Glucose, Bld: 94 mg/dL (ref 70–99)
Potassium: 4.2 mEq/L (ref 3.5–5.1)
Sodium: 138 mEq/L (ref 135–145)

## 2014-01-15 LAB — HEPATIC FUNCTION PANEL
ALT: 54 U/L — ABNORMAL HIGH (ref 0–53)
AST: 24 U/L (ref 0–37)
Albumin: 3.6 g/dL (ref 3.5–5.2)
Alkaline Phosphatase: 103 U/L (ref 39–117)
BILIRUBIN TOTAL: 0.9 mg/dL (ref 0.2–1.2)
Bilirubin, Direct: 0.1 mg/dL (ref 0.0–0.3)
Total Protein: 7.4 g/dL (ref 6.0–8.3)

## 2014-01-15 LAB — TSH: TSH: 3.69 u[IU]/mL (ref 0.35–4.50)

## 2014-01-15 NOTE — Patient Instructions (Signed)
Good to see you Ice 20 minutes 2 times daily. Usually after activity and before bed. Exercises 3 times a week.  Pennsaid up to 2 times daily See me again in 2 weeks.

## 2014-01-15 NOTE — Assessment & Plan Note (Signed)
Patient was given injection today and did tolerate the procedure very well. Patient still had some mild pain after the procedure which would be a little concern for patient having some possible labral pathology. On exam today to labral testing seemed to be unremarkable. We discussed an icing protocol and patient was showed home exercises. Patient was given a trial of topical anti-inflammatories. Patient is going to try these interventions and come back and see me again in 3 weeks. I'm optimistic he will do well with conservative therapy.

## 2014-01-15 NOTE — Progress Notes (Signed)
[facility] [address], [place] [address] Phone: [phone] Subjective:    I'm seeing this patient by the request  of:  Unice Cobble, MD   CC: right shoulder pain  EHM:[id number] Tim Strickland is a 48 y.o. male coming in with complaint of right shoulder pain. Patient is doing the renovation on a house and has been doing significantly more rapid repetitive motions and knees used to. Patient states that he has pain when he does certain range of motion such as reaching above his head or reaching out from his body in abduction. Patient denies any radiation down the arm or any numbness. Patient states that it is a dull throbbing aching pain that does not respond over-the-counter medicines. Patient puts the severity of 7 out of 10. Does not hurt him with regular daily activities. Patient states that this is his livelihood though and needs to continue working.     Past medical history, social, surgical and family history all reviewed in electronic medical record.   Review of Systems: No headache, visual changes, nausea, vomiting, diarrhea, constipation, dizziness, abdominal pain, skin rash, fevers, chills, night sweats, weight loss, swollen lymph nodes, body aches, joint swelling, muscle aches, chest pain, shortness of breath, mood changes.   Objective Blood pressure 118/82, pulse 91, height 6\' 1"  (1.854 m), weight 185 lb (83.915 kg), SpO2 97 %.  General: No apparent distress alert and oriented x3 mood and affect normal, dressed appropriately.  HEENT: Pupils equal, extraocular movements intact  Respiratory: Patient's speak in full sentences and does not appear short of breath  Cardiovascular: No lower extremity edema, non tender, no erythema  Skin: Warm dry intact with no signs of infection or rash on extremities or on axial skeleton.  Abdomen: Soft nontender  Neuro: Cranial nerves II through XII are intact, neurovascularly intact in all extremities  with 2+ DTRs and 2+ pulses.  Lymph: No lymphadenopathy of posterior or anterior cervical chain or axillae bilaterally.  Gait normal with good balance and coordination.  MSK:  Non tender with full range of motion and good stability and symmetric strength and tone of  elbows, wrist, hip, knee and ankles bilaterally.  Shoulder: Right Inspection reveals no abnormalities, atrophy or asymmetry. Palpation is normal with no tenderness over AC joint or bicipital groove. ROM is full in all planes passively. Rotator cuff strength normal throughout. signs of impingement with positive Neer and Hawkin's tests, but negative empty can sign. Speeds and Yergason's tests normal. No labral pathology noted with negative Obrien's, negative clunk and good stability. Normal scapular function observed. No painful arc and no drop arm sign. No apprehension sign Contralateral shoulder unremarkable  MSK US performed of: Right This study was ordered, performed, and interpreted by Charlann Boxer D.O.  Shoulder:   Supraspinatus:  Appears normal on long and transverse views, Bursal bulge seen with shoulder abduction on impingement view. Infraspinatus:  Appears normal on long and transverse views. Significant increase in Doppler flow Subscapularis:  Appears normal on long and transverse views. Positive bursa Teres Minor:  Appears normal on long and transverse views. AC joint:  Capsule undistended, no geyser sign. Glenohumeral Joint:  Appears normal without effusion. Glenoid Labrum:  Difficult to assess but no true tear appreciated Biceps Tendon:  Appears normal on long and transverse views, no fraying of tendon, tendon located in intertubercular groove, no subluxation with shoulder internal or external rotation.  Impression: Subacromial bursitiswith no significant rotator cuff pathology  Procedure: Real-time Ultrasound  Guided Injection of right glenohumeral joint Device: GE Logiq E  Ultrasound guided injection is  preferred based studies that show increased duration, increased effect, greater accuracy, decreased procedural pain, increased response rate with ultrasound guided versus blind injection.  Verbal informed consent obtained.  Time-out conducted.  Noted no overlying erythema, induration, or other signs of local infection.  Skin prepped in a sterile fashion.  Local anesthesia: Topical Ethyl chloride.  With sterile technique and under real time ultrasound guidance:  Joint visualized.  23g 1  inch needle inserted posterior approach. Pictures taken for needle placement. Patient did have injection of 2 cc of 1% lidocaine, 2 cc of 0.5% Marcaine, and 1.0 cc of Kenalog 40 mg/dL. Completed without difficulty  Pain immediately resolved suggesting accurate placement of the medication.  Advised to call if fevers/chills, erythema, induration, drainage, or persistent bleeding.  Images permanently stored and available for review in the ultrasound unit.  Impression: Technically successful ultrasound guided injection.     Impression and Recommendations:     This case required medical decision making of moderate complexity.

## 2014-01-17 ENCOUNTER — Other Ambulatory Visit: Payer: Self-pay | Admitting: Internal Medicine

## 2014-01-17 DIAGNOSIS — R7989 Other specified abnormal findings of blood chemistry: Secondary | ICD-10-CM

## 2014-01-17 LAB — NMR LIPOPROFILE WITH LIPIDS
CHOLESTEROL, TOTAL: 178 mg/dL (ref 100–199)
HDL PARTICLE NUMBER: 45.6 umol/L (ref >=30.5)
HDL Size: 9.3 nm (ref >=9.2)
HDL-C: 62 mg/dL (ref >39)
LARGE HDL: 9.3 umol/L (ref >=4.8)
LARGE VLDL-P: 14.7 nmol/L — AB (ref <=2.7)
LDL (calc): 69 mg/dL (ref 0–99)
LDL Particle Number: 885 nmol/L (ref <1000)
LDL Size: 20.7 nm (ref >=20.8)
LP-IR Score: 62 — ABNORMAL HIGH (ref <=45)
SMALL LDL PARTICLE NUMBER: 601 nmol/L — AB (ref <=527)
TRIGLYCERIDES: 233 mg/dL — AB (ref 0–149)
VLDL Size: 60.1 nm — ABNORMAL HIGH (ref <=46.6)

## 2014-01-18 ENCOUNTER — Encounter: Payer: Self-pay | Admitting: Internal Medicine

## 2014-01-27 ENCOUNTER — Ambulatory Visit (INDEPENDENT_AMBULATORY_CARE_PROVIDER_SITE_OTHER)
Admission: RE | Admit: 2014-01-27 | Discharge: 2014-01-27 | Disposition: A | Discharge status: Home / Self Care | Payer: 59 | Source: Ambulatory Visit | Attending: Internal Medicine | Admitting: Internal Medicine

## 2014-01-27 ENCOUNTER — Encounter: Payer: Self-pay | Admitting: Internal Medicine

## 2014-01-27 ENCOUNTER — Ambulatory Visit (INDEPENDENT_AMBULATORY_CARE_PROVIDER_SITE_OTHER): Payer: 59 | Admitting: Internal Medicine

## 2014-01-27 ENCOUNTER — Other Ambulatory Visit (INDEPENDENT_AMBULATORY_CARE_PROVIDER_SITE_OTHER): Payer: 59

## 2014-01-27 VITALS — BP 112/82 | HR 84 | Temp 98.2°F | Resp 13 | Wt 183.4 lb

## 2014-01-27 DIAGNOSIS — S298XXS Other specified injuries of thorax, sequela: Secondary | ICD-10-CM

## 2014-01-27 DIAGNOSIS — J90 Pleural effusion, not elsewhere classified: Secondary | ICD-10-CM

## 2014-01-27 DIAGNOSIS — E785 Hyperlipidemia, unspecified: Secondary | ICD-10-CM

## 2014-01-27 DIAGNOSIS — R7989 Other specified abnormal findings of blood chemistry: Secondary | ICD-10-CM

## 2014-01-27 DIAGNOSIS — S299XXS Unspecified injury of thorax, sequela: Secondary | ICD-10-CM

## 2014-01-27 DIAGNOSIS — D72829 Elevated white blood cell count, unspecified: Secondary | ICD-10-CM

## 2014-01-27 LAB — CBC WITH DIFFERENTIAL/PLATELET
BASOS ABS: 0.2 10*3/uL — AB (ref 0.0–0.1)
BASOS PCT: 2 % (ref 0.0–3.0)
EOS ABS: 1.3 10*3/uL — AB (ref 0.0–0.7)
Eosinophils Relative: 10.8 % — ABNORMAL HIGH (ref 0.0–5.0)
HEMATOCRIT: 45.2 % (ref 39.0–52.0)
HEMOGLOBIN: 15.6 g/dL (ref 13.0–17.0)
LYMPHS ABS: 2.9 10*3/uL (ref 0.7–4.0)
Lymphocytes Relative: 23.8 % (ref 12.0–46.0)
MCHC: 34.6 g/dL (ref 30.0–36.0)
MCV: 92.5 fl (ref 78.0–100.0)
MONO ABS: 0.8 10*3/uL (ref 0.1–1.0)
Monocytes Relative: 6.8 % (ref 3.0–12.0)
NEUTROS ABS: 6.9 10*3/uL (ref 1.4–7.7)
Neutrophils Relative %: 56.6 % (ref 43.0–77.0)
Platelets: 363 10*3/uL (ref 150.0–400.0)
RBC: 4.89 Mil/uL (ref 4.22–5.81)
RDW: 12.9 % (ref 11.5–15.5)
WBC: 12.1 10*3/uL — ABNORMAL HIGH (ref 4.0–10.5)

## 2014-01-27 NOTE — Progress Notes (Signed)
   Subjective:    Patient ID: Tim Strickland, male    DOB: 1966-02-24, 48 y.o.   MRN: [id number]  HPI  He was here to follow-up on his pleural effusion which resulted from trauma while hiking. He also was to reassess his white count. Unfortunately he did not understand these orders were in lab and x-ray facility & did not need an appointment  He also is here to follow-up on his NMR LipoProfile.  He's been taking the Crestor 5 mg. Irregularly. Some weeks he would not take it all.  Despite this his LDL was 69 with an LDL particle #885 & small dense # 601; these are ideal.  Unfortunately his triglycerides have risen from 168 up to 233.     Review of Systems   He continues to have some chest discomfort which limits full inspiration but is markedly better.  He denies any sputum production, hemoptysis, or shortness of breath this time  He is not having fever chills, sweats.     Objective:   Physical Exam General appearance is one of good health and nourishment w/o distress.  Eyes: No conjunctival inflammation or scleral icterus is present.  Oral exam: Dental hygiene is good; lips and gums are healthy appearing.There is no oropharyngeal erythema or exudate noted.   Heart:  Normal rate and regular rhythm. S1 and S2 normal without gallop, murmur, click, rub or other extra sounds     Lungs:Chest clear to auscultation; no wheezes, rhonchi,rales ,or rubs present.No increased work of breathing.   Abdomen: bowel sounds normal, soft and non-tender without masses, organomegaly or hernias noted.  No guarding or rebound . No tenderness over the flanks to percussion  Musculoskeletal: Able to lie flat and sit up without help. Negative straight leg raising bilaterally. Gait normal  Skin:Warm & dry.  Intact without suspicious lesions or rashes ; no jaundice or tenting  Lymphatic: No lymphadenopathy is noted about the head, neck, axilla               Assessment & Plan:  #1  hyperlipidemia #2 post chest trauma #3 leukocytosis See orders

## 2014-01-27 NOTE — Assessment & Plan Note (Signed)
Lipids off Crestor in 4 months Avoid HFCS

## 2014-01-27 NOTE — Patient Instructions (Signed)
Please  get fasting Labs in 4 months; CBC & dif and CXray today Please  blowup at least 5-10  balloons a day to enhance inflation of the lungs and prevent atelectasis as we discussed.

## 2014-01-27 NOTE — Progress Notes (Signed)
Pre visit review using our clinic review tool, if applicable. No additional management support is needed unless otherwise documented below in the visit note. 

## 2014-01-28 LAB — LIPID PANEL
Cholesterol: 205 mg/dL — ABNORMAL HIGH (ref 0–200)
HDL: 64.1 mg/dL (ref >39.00)
LDL Cholesterol: 110 mg/dL — ABNORMAL HIGH (ref 0–99)
NONHDL: 140.9
Total CHOL/HDL Ratio: 3
Triglycerides: 154 mg/dL — ABNORMAL HIGH (ref 0.0–149.0)
VLDL: 30.8 mg/dL (ref 0.0–40.0)

## 2014-02-05 ENCOUNTER — Encounter: Payer: Self-pay | Admitting: Family Medicine

## 2014-02-05 ENCOUNTER — Ambulatory Visit (INDEPENDENT_AMBULATORY_CARE_PROVIDER_SITE_OTHER): Payer: 59 | Admitting: Family Medicine

## 2014-02-05 VITALS — BP 112/74 | HR 85 | Ht 73.0 in | Wt 184.0 lb

## 2014-02-05 DIAGNOSIS — M7551 Bursitis of right shoulder: Secondary | ICD-10-CM

## 2014-02-05 NOTE — Patient Instructions (Signed)
Call York Cerise roland on the charge.  Keep working on what you are doing. New exercises for strengthening now  Ice after activity Give it about 4-6 weeks and then if still not perfect come back and see me.

## 2014-02-05 NOTE — Progress Notes (Signed)
  [facility] [address], [place] [address] Phone: [phone] Subjective:     CC: right shoulder pain follow up  Tim Strickland is a 48 y.o. male coming in with complaint of right shoulder pain. Patient was seen previously and had more of a subacromial bursitis of the right shoulder. Patient was given a corticosteroid injection. Patient states that this made minimal improvement. Patient denies any weakness but states with 1 certain movement and abduction he has some discomfort mostly over the deltoid muscle. Patient states the shoulder itself still feels fairly good. Patient feels like this is more of a bone spur giving him the discomfort. Patient states that this is not stopping him from any activities at this time. Patient states that he is sleeping comfortably. Only notices it if he tries to do multiple pushups in a row.     Past medical history, social, surgical and family history all reviewed in electronic medical record.   Review of Systems: No headache, visual changes, nausea, vomiting, diarrhea, constipation, dizziness, abdominal pain, skin rash, fevers, chills, night sweats, weight loss, swollen lymph nodes, body aches, joint swelling, muscle aches, chest pain, shortness of breath, mood changes.   Objective Blood pressure 112/74, weight 184 lb (83.462 kg).  General: No apparent distress alert and oriented x3 mood and affect normal, dressed appropriately.  HEENT: Pupils equal, extraocular movements intact  Respiratory: Patient's speak in full sentences and does not appear short of breath  Cardiovascular: No lower extremity edema, non tender, no erythema  Skin: Warm dry intact with no signs of infection or rash on extremities or on axial skeleton.  Abdomen: Soft nontender  Neuro: Cranial nerves II through XII are intact, neurovascularly intact in all extremities with 2+ DTRs and 2+ pulses.  Lymph: No lymphadenopathy of posterior  or anterior cervical chain or axillae bilaterally.  Gait normal with good balance and coordination.  MSK:  Non tender with full range of motion and good stability and symmetric strength and tone of  elbows, wrist, hip, knee and ankles bilaterally.  Shoulder: Right Inspection reveals no abnormalities, atrophy or asymmetry. Palpation is normal with no tenderness over AC joint or bicipital groove. ROM is full in all planes passively. Rotator cuff strength normal throughout. signs of impingement with positive impingement on abduction Speeds and Yergason's tests normal. No labral pathology noted with negative Obrien's, negative clunk and good stability. Normal scapular function observed. No painful arc and no drop arm sign. No apprehension sign Contralateral shoulder unremarkable       Impression and Recommendations:     This case required medical decision making of moderate complexity.

## 2014-02-05 NOTE — Assessment & Plan Note (Signed)
Patient did not have any significant findings on ultrasound except for a subacromial bursitis and was given an injection. Patient presents like an impingement-type syndrome but no spurring noted. Discussed with patient that we can get x-rays which she declined. Patient though states that this is not affecting his daily activities and would like to continue conservative therapy. Patient declined formal physical therapy. Patient will continue doing the regimen he is doing and was given topical anti-inflammatories. Patient will come back and see me again in 4-6 weeks if continuing to have any discomfort.

## 2014-10-13 ENCOUNTER — Other Ambulatory Visit: Payer: Self-pay

## 2014-10-13 MED ORDER — ALBUTEROL SULFATE HFA 108 (90 BASE) MCG/ACT IN AERS: 2.0000 | INHALATION_SPRAY | Freq: Four times a day (QID) | RESPIRATORY_TRACT | Status: DC | PRN

## 2015-01-06 ENCOUNTER — Encounter: Payer: Self-pay | Admitting: Internal Medicine

## 2015-01-06 ENCOUNTER — Ambulatory Visit (INDEPENDENT_AMBULATORY_CARE_PROVIDER_SITE_OTHER): Payer: 59 | Admitting: Internal Medicine

## 2015-01-06 VITALS — BP 100/60 | HR 64 | Temp 98.1°F | Resp 14 | Wt 177.0 lb

## 2015-01-06 DIAGNOSIS — Z23 Encounter for immunization: Secondary | ICD-10-CM

## 2015-01-06 DIAGNOSIS — Z0000 Encounter for general adult medical examination without abnormal findings: Secondary | ICD-10-CM

## 2015-01-06 MED ORDER — MONTELUKAST SODIUM 10 MG PO TABS: 10.0000 mg | ORAL_TABLET | Freq: Every day | ORAL | Status: DC

## 2015-01-06 MED ORDER — ALBUTEROL SULFATE 108 (90 BASE) MCG/ACT IN AEPB: 2.0000 | INHALATION_SPRAY | Freq: Four times a day (QID) | RESPIRATORY_TRACT | Status: DC | PRN

## 2015-01-06 MED ORDER — ALBUTEROL SULFATE HFA 108 (90 BASE) MCG/ACT IN AERS: 2.0000 | INHALATION_SPRAY | Freq: Four times a day (QID) | RESPIRATORY_TRACT | Status: DC | PRN

## 2015-01-06 NOTE — Progress Notes (Signed)
   Subjective:    Patient ID: Tim Strickland, male    DOB: [birth year], 49 y.o.   MRN: [id number]  HPI The patient is here to assess status of active health conditions.  PMH, FH, & Social History reviewed & updated.Change in [place] as recorded includes possible nervous breakdown in maternal grandmother rather than a stroke. Paternal grandfather may have had gout.  He is on no regular medications. He takes generic Singulair and albuterol inhalations on an as-needed basis only.  He denies excess red meat or sodium. He's physically active remodeling. With this he's lost 5-10 pounds.  He has never smoked. He drinks 4-6 beers per day. Standards related to alcohol intake were discussed.   He has intermittent pain in the left large toe, right shoulder, and right wrist. He did have a steroid injection in the right shoulder for bursitis in December 2015.  He also has nocturia once nightly..  Review of Systems  Chest pain, palpitations, tachycardia, exertional dyspnea, paroxysmal nocturnal dyspnea, claudication or edema are absent. No unexplained weight loss, abdominal pain, significant dyspepsia, dysphagia, melena, rectal bleeding, or persistently small caliber stools. Dysuria, pyuria, hematuria, frequency,  or polyuria are denied. Change in hair, skin, nails denied. No bowel changes of constipation or diarrhea. No intolerance to heat or cold.     Objective:   Physical Exam  Pertinent or positive findings include: There is decreased cervical spine range of motion. He has isolated DIP changes in the hands. He has minimal crepitus in the knees. Vasectomy scar tissue is present bilaterally. Prostate is small and soft.  General appearance :adequately nourished; in no distress.  Eyes: No conjunctival inflammation or scleral icterus is present.  Oral exam:  Lips and gums are healthy appearing.There is no oropharyngeal erythema or exudate noted. Dental hygiene is good.  Heart:  Normal rate and  regular rhythm. S1 and S2 normal without gallop, murmur, click, rub or other extra sounds    Lungs:Chest clear to auscultation; no wheezes, rhonchi,rales ,or rubs present.No increased work of breathing.   Abdomen: bowel sounds normal, soft and non-tender without masses, organomegaly or hernias noted.  No guarding or rebound.   Vascular : all pulses equal ; no bruits present.  Skin:Warm & dry.  Intact without suspicious lesions or rashes ; no tenting or jaundice   Lymphatic: No lymphadenopathy is noted about the head, neck, axilla, or inguinal areas.   Neuro: Strength, tone & DTRs normal.     Assessment & Plan:  #1 comprehensive physical exam; no acute findings  Plan: see Orders  & Recommendations

## 2015-01-06 NOTE — Patient Instructions (Signed)
  Your next office appointment will be determined based upon review of your pending labs. Those written interpretation of the lab results and instructions will be transmitted to you by My Chart  Critical results will be called.   Followup as needed for any active or acute issue. Please report any significant change in your symptoms. 

## 2015-01-06 NOTE — Progress Notes (Signed)
Pre visit review using our clinic review tool, if applicable. No additional management support is needed unless otherwise documented below in the visit note. 

## 2015-01-07 ENCOUNTER — Encounter: Payer: Self-pay | Admitting: Internal Medicine

## 2016-02-17 ENCOUNTER — Telehealth: Payer: Self-pay | Admitting: Internal Medicine

## 2016-02-17 NOTE — Telephone Encounter (Signed)
Patient has an appt next week. Asking for a temporary fill of maintenance meds be sent to [facility] on file.

## 2016-02-18 ENCOUNTER — Other Ambulatory Visit: Payer: Self-pay | Admitting: Family

## 2016-02-19 MED ORDER — MONTELUKAST SODIUM 10 MG PO TABS: 10.0000 mg | ORAL_TABLET | Freq: Every day | ORAL | 0 refills | Status: DC

## 2016-02-19 MED ORDER — ALBUTEROL SULFATE 108 (90 BASE) MCG/ACT IN AEPB: 2.0000 | INHALATION_SPRAY | Freq: Four times a day (QID) | RESPIRATORY_TRACT | 0 refills | Status: DC | PRN

## 2016-02-19 NOTE — Telephone Encounter (Signed)
Rx filled

## 2016-02-23 ENCOUNTER — Other Ambulatory Visit (INDEPENDENT_AMBULATORY_CARE_PROVIDER_SITE_OTHER): Payer: BLUE CROSS/BLUE SHIELD

## 2016-02-23 ENCOUNTER — Encounter: Payer: Self-pay | Admitting: Family

## 2016-02-23 ENCOUNTER — Ambulatory Visit (INDEPENDENT_AMBULATORY_CARE_PROVIDER_SITE_OTHER)
Admission: RE | Admit: 2016-02-23 | Discharge: 2016-02-23 | Disposition: A | Discharge status: Home / Self Care | Payer: BLUE CROSS/BLUE SHIELD | Source: Ambulatory Visit | Attending: Family | Admitting: Family

## 2016-02-23 ENCOUNTER — Ambulatory Visit (INDEPENDENT_AMBULATORY_CARE_PROVIDER_SITE_OTHER): Payer: BLUE CROSS/BLUE SHIELD | Admitting: Family

## 2016-02-23 VITALS — BP 148/88 | HR 74 | Temp 97.5°F | Resp 16 | Ht 73.0 in | Wt 174.0 lb

## 2016-02-23 DIAGNOSIS — M778 Other enthesopathies, not elsewhere classified: Secondary | ICD-10-CM | POA: Insufficient documentation

## 2016-02-23 DIAGNOSIS — M65979 Unspecified synovitis and tenosynovitis, unspecified ankle and foot: Secondary | ICD-10-CM

## 2016-02-23 DIAGNOSIS — J4520 Mild intermittent asthma, uncomplicated: Secondary | ICD-10-CM

## 2016-02-23 DIAGNOSIS — M7541 Impingement syndrome of right shoulder: Secondary | ICD-10-CM | POA: Insufficient documentation

## 2016-02-23 DIAGNOSIS — M659 Synovitis and tenosynovitis, unspecified: Secondary | ICD-10-CM | POA: Diagnosis not present

## 2016-02-23 DIAGNOSIS — Z23 Encounter for immunization: Secondary | ICD-10-CM

## 2016-02-23 HISTORY — DX: Impingement syndrome of right shoulder: M75.41

## 2016-02-23 HISTORY — DX: Other enthesopathies, not elsewhere classified: M77.8

## 2016-02-23 HISTORY — DX: Unspecified synovitis and tenosynovitis, unspecified ankle and foot: M65.979

## 2016-02-23 HISTORY — DX: Synovitis and tenosynovitis, unspecified: M65.9

## 2016-02-23 LAB — URIC ACID: URIC ACID, SERUM: 5.2 mg/dL (ref 4.0–7.8)

## 2016-02-23 MED ORDER — DICLOFENAC SODIUM 2 % TD SOLN: 1.0000 "application " | Freq: Two times a day (BID) | TRANSDERMAL | 1 refills | Status: DC | PRN

## 2016-02-23 MED ORDER — ALBUTEROL SULFATE 108 (90 BASE) MCG/ACT IN AEPB: 2.0000 | INHALATION_SPRAY | Freq: Four times a day (QID) | RESPIRATORY_TRACT | 0 refills | Status: DC | PRN

## 2016-02-23 MED ORDER — IBUPROFEN-FAMOTIDINE 800-26.6 MG PO TABS: 1.0000 | ORAL_TABLET | Freq: Three times a day (TID) | ORAL | 1 refills | Status: DC | PRN

## 2016-02-23 MED ORDER — MONTELUKAST SODIUM 10 MG PO TABS: 10.0000 mg | ORAL_TABLET | Freq: Every day | ORAL | 0 refills | Status: DC

## 2016-02-23 NOTE — Assessment & Plan Note (Signed)
Symptoms and exam consistent with left wrist tendinitis that is improved with mobilization at night. Continue with immobilization. Recommend ice regimen with Pennsaid as needed. Follow-up if symptoms do not continually improve.

## 2016-02-23 NOTE — Patient Instructions (Signed)
Thank you for choosing [facility]!  Ice / moist heat x 20 minutes every 2 hours and as needed or following activity  Pennsaid - Approximately 1/2 packet to the affected site twice daily.  Duexis - 1 tablet 3 times per day for the next 5-7 days and then as needed.  Exercises 1-2 times per day as instructed.   Wrist brace at night.   You will receive a call from [facility] or other pharmacy regarding your Pennsaid/Duexis/Vimovo. The medication will be mailed to you and should cost you no more than $10 per item or possibly free depending upon your insurance.   You will receive a call to schedule your imaging, appointment or physical therapy.  Your prescription(s) have been submitted to your pharmacy or been printed and provided for you. Please take as directed and contact our office if you believe you are having problem(s) with the medication(s) or have any questions.  Please stop by the lab on the lower level of the building for your blood work. Your results will be released to [facility] (or called to you) after review, usually within 72 hours after test completion. If any changes need to be made, you will be notified at that same time.  1.) The lab is open from 7:30am to 5:30 pm Monday-Friday 2.) No appointment is necessary 3.) Fasting (if needed) is 6-8 hours after food and drink; black coffee and water are okay   Please stop by radiology on the basement level of the building for your x-rays. Your results will be released to [facility] (or called to you) after review, usually within 72 hours after test completion. If any treatments or changes are necessary, you will be notified at that same time.  If your symptoms worsen or fail to improve, please contact our office for further instruction, or in case of emergency go directly to the emergency room at the closest medical facility.    Shoulder Impingement Syndrome Rehab Ask your health care provider which exercises are safe for you. Do  exercises exactly as told by your health care provider and adjust them as directed. It is normal to feel mild stretching, pulling, tightness, or discomfort as you do these exercises, but you should stop right away if you feel sudden pain or your pain gets worse.Do not begin these exercises until told by your health care provider. Stretching and range of motion exercise This exercise warms up your muscles and joints and improves the movement and flexibility of your shoulder. This exercise also helps to relieve pain and stiffness. Exercise A: Passive horizontal adduction 1. Sit or stand and pull your left / right elbow across your chest, toward your other shoulder. Stop when you feel a gentle stretch in the back of your shoulder and upper arm.  Keep your arm at shoulder height.  Keep your arm as close to your body as you comfortably can. 2. Hold for __________ seconds. 3. Slowly return to the starting position. Repeat __________ times. Complete this exercise __________ times a day. Strengthening exercises These exercises build strength and endurance in your shoulder. Endurance is the ability to use your muscles for a long time, even after they get tired. Exercise B: External rotation, isometric 1. Stand or sit in a doorway, facing the door frame. 2. Bend your left / right elbow and place the back of your wrist against the door frame. Only your wrist should be touching the frame. Keep your upper arm at your side. 3. Gently press your wrist  against the door frame, as if you are trying to push your arm away from your abdomen.  Avoid shrugging your shoulder while you press your hand against the door frame. Keep your shoulder blade tucked down toward the middle of your back. 4. Hold for __________ seconds. 5. Slowly release the tension, and relax your muscles completely before you do the exercise again. Repeat __________ times. Complete this exercise __________ times a day. Exercise C: Internal  rotation, isometric 1. Stand or sit in a doorway, facing the door frame. 2. Bend your left / right elbow and place the inside of your wrist against the door frame. Only your wrist should be touching the frame. Keep your upper arm at your side. 3. Gently press your wrist against the door frame, as if you are trying to push your arm toward your abdomen.  Avoid shrugging your shoulder while you press your hand against the door frame. Keep your shoulder blade tucked down toward the middle of your back. 4. Hold for __________ seconds. 5. Slowly release the tension, and relax your muscles completely before you do the exercise again. Repeat __________ times. Complete this exercise __________ times a day. Exercise D: Scapular protraction, supine 1. Lie on your back on a firm surface. Hold a __________ weight in your left / right hand. 2. Raise your left / right arm straight into the air so your hand is directly above your shoulder joint. 3. Push the weight into the air so your shoulder lifts off of the surface that you are lying on. Do not move your head, neck, or back. 4. Hold for __________ seconds. 5. Slowly return to the starting position. Let your muscles relax completely before you repeat this exercise. Repeat __________ times. Complete this exercise __________ times a day. Exercise E: Scapular retraction 1. Sit in a stable chair without armrests, or stand. 2. Secure an exercise band to a stable object in front of you so the band is at shoulder height. 3. Hold one end of the exercise band in each hand. Your palms should face down. 4. Squeeze your shoulder blades together and move your elbows slightly behind you. Do not shrug your shoulders while you do this. 5. Hold for __________ seconds. 6. Slowly return to the starting position. Repeat __________ times. Complete this exercise __________ times a day. Exercise F: Shoulder extension 1. Sit in a stable chair without armrests, or  stand. 2. Secure an exercise band to a stable object in front of you where the band is above shoulder height. 3. Hold one end of the exercise band in each hand. 4. Straighten your elbows and lift your hands up to shoulder height. 5. Squeeze your shoulder blades together and pull your hands down to the sides of your thighs. Stop when your hands are straight down by your sides. Do not let your hands go behind your body. 6. Hold for __________ seconds. 7. Slowly return to the starting position. Repeat __________ times. Complete this exercise __________ times a day. This information is not intended to replace advice given to you by your health care provider. Make sure you discuss any questions you have with your health care provider. Document Released: 02/21/2005 Document Revised: 10/29/2015 Document Reviewed: 01/24/2015 Elsevier Interactive Patient Education  2017 [employer].

## 2016-02-23 NOTE — Assessment & Plan Note (Signed)
Symptoms and exam consistent with possible capsulitis or synovitis of the MCP joint of the left great toe. Treat conservatively with ice, NSAIDs, and Duexis. Recommend good supportive shoe. Obtain uric acid to rule out gout as he does have significant family history. Continue to monitor.

## 2016-02-23 NOTE — Assessment & Plan Note (Signed)
Asthma is mild, intermittent, and uncomplicated of present time with no nighttime awakenings and irregular albuterol usage. Continue current dosage of albuterol as needed. Continue to monitor.

## 2016-02-23 NOTE — Assessment & Plan Note (Signed)
Symptoms and exam consistent with shoulder impingement and possible bursitis. This is a chronic problem. Given no significant improvements recommend physical therapy. Obtain x-rays for structural evaluation. Start Pennsaid and Duexis. Recommend ice regimen and home exercise therapy. Consider cortisone injection if symptoms worsen or do not improve.

## 2016-02-23 NOTE — Progress Notes (Signed)
Subjective:    Patient ID: Tim Strickland, male    DOB: Jun 24, 1965, 50 y.o.   MRN: [id number]  Chief Complaint  Patient presents with  . Establish Care    joint pains and stiffness in the mornings    HPI:  Tim Strickland is a 50 y.o. male who  has a past medical history of Asthma; Elevated liver enzymes; Hyperglycemia; and Hyperlipidemia. and presents today for an office visit.   1.) Joint pain and stiffness - Continues to experience the associated symptoms of pain and stiffness located in his joints including right shoulder, left wrist and left toe. This has been going on for several years all together. Timing of the symptoms of stiffness improve throughout the day and the other joint pains are constant. Severity of the symptoms varies on a day to day basis. Modifying factors include ibuprofen and Tylenol which seem to help a little and makes in manageable. Over the course the symptoms have gradually improved some. He works in [employer] and does a lot of repetitive motions. No specific trauma or injury that he can recall.    Allergies  Allergen Reactions  . Pravastatin     04/03/2009: AST 51 and ALT 101 on pravastatin 20 mg daily. LDL at that time was 192.6.      Outpatient Medications Prior to Visit  Medication Sig Dispense Refill  . Albuterol Sulfate (PROAIR RESPICLICK) 123XX123 (90 Base) MCG/ACT AEPB Inhale 2 puffs into the lungs every 6 (six) hours as needed. 1 each 0  . montelukast (SINGULAIR) 10 MG tablet Take 1 tablet (10 mg total) by mouth at bedtime. 30 tablet 0   No facility-administered medications prior to visit.       Past Surgical History:  Procedure Laterality Date  . APPENDECTOMY    . TONSILLECTOMY    . VASECTOMY    . WISDOM TOOTH EXTRACTION        Past Medical History:  Diagnosis Date  . Asthma   . Elevated liver enzymes    PMH of  on Pravachol  . Hyperglycemia    A1c 5.2% in 2010  . Hyperlipidemia       Review of Systems  Constitutional:  Negative for chills and fever.  Respiratory: Negative for chest tightness and shortness of breath.   Cardiovascular: Negative for chest pain, palpitations and leg swelling.  Musculoskeletal: Negative for joint swelling and myalgias.       Positive for right shoulder, left wrist, and left great toe pain  Neurological: Negative for weakness and numbness.      Objective:    BP (!) 148/88   Pulse 74   Temp 97.5 F (36.4 C) (Oral)   Resp 16   Ht 6\' 1"  (1.854 m)   Wt 174 lb (78.9 kg)   SpO2 97%   BMI 22.96 kg/m  Nursing note and vital signs reviewed.  Physical Exam  Constitutional: He is oriented to person, place, and time. He appears well-developed and well-nourished. No distress.  Cardiovascular: Normal rate, regular rhythm, normal heart sounds and intact distal pulses.   Pulmonary/Chest: Effort normal and breath sounds normal.  Musculoskeletal:  Right shoulder-no obvious deformity, discoloration, or edema. Tenderness over subacromial space and bicep tendon. Range of motion limited above 120 of flexion and abduction. Strength is slightly decreased in abduction and empty can. Distal pulses and sensation are intact and appropriate. Negative Michel Bickers; positive Neer's impingement; positive empty can.  Left wrist - no obvious deformity, discoloration, or edema.  Palpable tenderness along the medial aspect in the area of the triangular fibrocartilage complex. No significant pain noted upon range of motion with full range of motion noted. Negative valgus/varus. Capillary refill and pulses are intact and appropriate.  Left great toe - no obvious deformity, discoloration, or edema. There is mild tenderness over the capsule of the MCP joint. Range of motion within normal limits. Capillary refill intact and appropriate.  Neurological: He is alert and oriented to person, place, and time.  Skin: Skin is warm and dry.  Psychiatric: He has a normal mood and affect. His behavior is normal.  Judgment and thought content normal.       Assessment & Plan:   Problem List Items Addressed This Visit      Respiratory   Asthma    Asthma is mild, intermittent, and uncomplicated of present time with no nighttime awakenings and irregular albuterol usage. Continue current dosage of albuterol as needed. Continue to monitor.      Relevant Medications   Albuterol Sulfate (PROAIR RESPICLICK) 123XX123 (90 Base) MCG/ACT AEPB   montelukast (SINGULAIR) 10 MG tablet     Musculoskeletal and Integument   Left wrist tendinitis    Symptoms and exam consistent with left wrist tendinitis that is improved with mobilization at night. Continue with immobilization. Recommend ice regimen with Pennsaid as needed. Follow-up if symptoms do not continually improve.      Relevant Orders   Ambulatory referral to Physical Therapy   Synovitis of toe    Symptoms and exam consistent with possible capsulitis or synovitis of the MCP joint of the left great toe. Treat conservatively with ice, NSAIDs, and Duexis. Recommend good supportive shoe. Obtain uric acid to rule out gout as he does have significant family history. Continue to monitor.      Relevant Medications   Ibuprofen-Famotidine 800-26.6 MG TABS   Other Relevant Orders   Uric acid (Completed)     Other   Shoulder impingement syndrome, right - Primary    Symptoms and exam consistent with shoulder impingement and possible bursitis. This is a chronic problem. Given no significant improvements recommend physical therapy. Obtain x-rays for structural evaluation. Start Pennsaid and Duexis. Recommend ice regimen and home exercise therapy. Consider cortisone injection if symptoms worsen or do not improve.      Relevant Medications   Diclofenac Sodium (PENNSAID) 2 % SOLN   Ibuprofen-Famotidine 800-26.6 MG TABS   Other Relevant Orders   DG Shoulder Right (Completed)   Ambulatory referral to Physical Therapy    Other Visit Diagnoses    Encounter for  immunization       Relevant Orders   Flu Vaccine QUAD 36+ mos IM (Completed)       I am having Mr. Slutzky start on Diclofenac Sodium and Ibuprofen-Famotidine. I am also having him maintain his Albuterol Sulfate and montelukast.   Meds ordered this encounter  Medications  . Diclofenac Sodium (PENNSAID) 2 % SOLN    Sig: Place 1 application onto the skin 2 (two) times daily as needed.    Dispense:  112 g    Refill:  1    Order Specific Question:   Supervising Provider    Answer:   Pricilla Holm A [id number]  . Ibuprofen-Famotidine 800-26.6 MG TABS    Sig: Take 1 tablet by mouth 3 (three) times daily as needed.    Dispense:  90 tablet    Refill:  1    Order Specific Question:   Supervising Provider  Answer:   Pricilla Holm A [id number]  . Albuterol Sulfate (PROAIR RESPICLICK) 123XX123 (90 Base) MCG/ACT AEPB    Sig: Inhale 2 puffs into the lungs every 6 (six) hours as needed.    Dispense:  1 each    Refill:  0    Order Specific Question:   Supervising Provider    Answer:   Pricilla Holm A [id number]  . montelukast (SINGULAIR) 10 MG tablet    Sig: Take 1 tablet (10 mg total) by mouth at bedtime.    Dispense:  30 tablet    Refill:  0    Order Specific Question:   Supervising Provider    Answer:   Pricilla Holm A [id number]     Follow-up: Return in about 1 month (around 03/25/2016), or if symptoms worsen or fail to improve.  Mauricio Po, FNP

## 2016-03-08 ENCOUNTER — Ambulatory Visit: Payer: BLUE CROSS/BLUE SHIELD | Attending: Family | Admitting: Occupational Therapy

## 2016-03-08 DIAGNOSIS — M25611 Stiffness of right shoulder, not elsewhere classified: Secondary | ICD-10-CM | POA: Diagnosis not present

## 2016-03-08 DIAGNOSIS — M6281 Muscle weakness (generalized): Secondary | ICD-10-CM | POA: Diagnosis present

## 2016-03-08 DIAGNOSIS — M25511 Pain in right shoulder: Secondary | ICD-10-CM | POA: Insufficient documentation

## 2016-03-08 DIAGNOSIS — R293 Abnormal posture: Secondary | ICD-10-CM | POA: Insufficient documentation

## 2016-03-08 NOTE — Patient Instructions (Signed)
   ROM: Abduction - Wand   Holding wand with right hand palm up, push wand directly out to side, leading with other hand palm down, until stretch is felt. Hold 10 seconds. Repeat 5-10 times per set. Do 2 sessions per day. (Lying down)   ROM: Extension - Wand (Standing)   Stand holding wand behind back. Raise arms as far as possible.  PALMS forward, head and chest up. Repeat 5-10 times per set.  Do 2-3 sessions per day.     Active Assistive Shoulder External Rotation    SIT with stick at waist level, right palm up, other palm down. push forearm out from body with hand palm up, and keep elbows bent. Hold 10sec. return to start position. Perform 5-10 reps. 2X DAY   Flexion (Eccentric) - Active-Assist (Cane)    Lay down, Use unaffected arm to push affected arm forward. Avoid hiking shoulder. Keep palm relaxed. Slowly lower affected arm for 5 seconds, increasing use of affected arm. 10 reps per set, 2 sets per day.     http://ecce.exer.us/152   Copyright  VHI. All rights reserved.  ROM: Flexion - Wand

## 2016-03-08 NOTE — Therapy (Signed)
[facility] [address] [place], [place], [address] Phone: [phone]   Fax:  [phone]  Occupational Therapy Evaluation  Patient Details  Name: Tim Strickland MRN: [id number] Date of Birth: 1965-08-14 Referring Provider: Ples Specter. Calone  Encounter Date: 03/08/2016      OT End of Session - 03/08/16 1546    Visit Number 1   Number of Visits 17   Date for OT Re-Evaluation 05/05/16   Authorization Type BCBS--awaiting insurance verification   OT Start Time [id number]   OT Stop Time 1407   OT Time Calculation (min) 46 min   Activity Tolerance Patient tolerated treatment well   Behavior During Therapy WFL for tasks assessed/performed      Past Medical History:  Diagnosis Date  . Asthma   . Elevated liver enzymes    PMH of  on Pravachol  . Hyperglycemia    A1c 5.2% in 2010  . Hyperlipidemia     Past Surgical History:  Procedure Laterality Date  . APPENDECTOMY    . TONSILLECTOMY    . VASECTOMY    . WISDOM TOOTH EXTRACTION      There were no vitals filed for this visit.      Subjective Assessment - 03/08/16 1326    Subjective  Pt reports that shoulder pain has been going on for approx 2 years.  Pt reports that L wrist pain is not bothering him at this time and that he does not feel that it needs to be addressed at this time.   Pertinent History R shoulder pain for approx 2 years, cortisone injection x1 approx 2 years ago   Limitations limiting work and leisure activities   Patient Stated Goals build up strength, decr tightness   Currently in Pain? Yes   Pain Score --  3-8/10   Pain Location Shoulder   Pain Orientation Right   Pain Descriptors / Indicators Sharp;Aching;Dull   Pain Type Chronic pain   Pain Onset More than a month ago   Pain Frequency Constant   Aggravating Factors  rotation   Pain Relieving Factors rest           [facility] OT Assessment - 03/08/16 0001      Assessment   Diagnosis  R shoulder impingement, L wrist tendonitis (pt does not feel that L wrist needs to be addressed at this time).   Referring Provider Ples Specter. Calone   Onset Date --  approx 2 years ago   Prior Therapy none     Balance Screen   Has the patient fallen in the past 6 months No     Home  Environment   Family/patient expects to be discharged to: Private residence   Lives With Spouse  73 y.o. 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     Mobility   Mobility Status Independent     Written Expression   Dominant Hand Left     Observation/Other Assessments   Observations Rounded shoulders, forward head, R scapula appears to be winging.  Pt appears to compensate for decr posture/proximal strength by hooking feet under him     ROM / Strength   AROM / PROM / Strength  AROM;Strength     AROM   Overall AROM  Deficits   Overall AROM Comments grossly WNL except R shoulder   AROM Assessment Site Shoulder   Right/Left Shoulder Right   Right Shoulder Flexion 135 Degrees   Right Shoulder ABduction 55 Degrees   Right Shoulder Internal Rotation --  grossly WNL   Right Shoulder External Rotation 110 Degrees     Strength   Overall Strength Deficits   Overall Strength Comments R shoulder strength grossly 5/5   Strength Assessment Site Shoulder;Elbow   Right/Left Shoulder Right   Right Shoulder Flexion 4+/5   Right Shoulder ABduction 4/5  limited by pain   Right Shoulder Horizontal ABduction 4+/5   Right Shoulder Horizontal ADduction 4+/5   Right/Left Elbow Right   Right Elbow Flexion 5/5   Right Elbow Extension 5/5     Hand Function   Right Hand Grip (lbs) 84   Left Hand Grip (lbs) 95   Comment --         Manual:   Gentle joint mobs to R shoulder prior to instruction in ROM HEP.                OT Education - 03/08/16 1543    Education provided Yes   Education Details Importance of posture for improved scapula/shoulder positioning (avoiding compensation patterns including stabilizing with feet crossed, avoid shoulder hiking and trunk compensation), Initial ROM HEP with cane (see pt instructions)   Person(s) Educated Patient   Methods Explanation;Handout;Verbal cues;Demonstration   Comprehension Verbalized understanding;Returned demonstration;Verbal cues required  cueing for posture, avoiding compensation          OT Short Term Goals - 03/08/16 1611      OT SHORT TERM GOAL #1   Title Pt will be independent with initial HEP for ROM.--check 04/07/16   Time 4   Period Weeks   Status New     OT SHORT TERM GOAL #2   Title Pt will report pain less than or equal to 5/10 or less with work/ADL tasks.   Baseline 3-8/10   Time 4   Period Weeks   Status New     OT SHORT TERM GOAL #3   Title Pt will demo at least 70* R shoulder abduction for lateral reaching.   Baseline 55*   Time 4   Period Weeks   Status New     OT SHORT TERM GOAL #4   Title Assess Quick DASH/Upper Extremity Functional Index and set goal as appropriate.   Time 4   Period Weeks   Status New           OT Long Term Goals - 03/08/16 1614      OT LONG TERM GOAL #1   Title Pt will be independent with updated/strengthening.--check 05/06/16   Time 8   Period Weeks   Status New     OT LONG TERM GOAL #2   Title Pt will report pain less than or equal to 4/10 or less with work/ADL tasks.   Time 8   Period Weeks   Status New     OT LONG TERM GOAL #3   Title Pt will demo at least 85* R shoulder abduction for lateral reaching.   Baseline 55*   Time 8   Period Weeks   Status New     OT LONG TERM GOAL #4   Title Pt will demo good body mechanics/positioning during simulated work tasks.   Time 8   Period Weeks  Status New               Plan - 03/08/16 1548    Clinical Impression Statement Pt is a 51 y.o. male with referring diagnosis of R shoulder impingement and L wrist tendonitis.  Pt reports that he does not feel that L wrist needs to be addressed as he is having no pain (uses splint at night occasionally when it flares up and this is managed well).  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  Pt presents with decr posture, decr RUE ROM, decr strength, and pain affecting ADLs and RUE functional use.  Pt would benefit from occupational therapy to address these deficits to prevent future complications, decr pain, improve ADL performance, improve RUE functional use and improve quality of life.    Rehab Potential Good   Clinical Impairments Affecting Rehab Potential chronic nature of pain   OT Frequency 2x / week   OT Duration 8 weeks  +eval   OT Treatment/Interventions Self-care/ADL training;Therapeutic exercise;Electrical Stimulation;Iontophoresis;Splinting;Neuromuscular education;Parrafin;Moist Heat;Fluidtherapy;Energy conservation;Therapeutic exercises;Patient/family education;Functional Mobility Training;Passive range of motion;Therapeutic activities;Manual Therapy;DME and/or AE instruction;Contrast Bath;Ultrasound;Cryotherapy;Scar mobilization   Plan Assess Upper Extremity Functional Index/Quick DASH and write goal; review cane HEP, scapular stabilization/mobility exercises   OT Home Exercise Plan Education provided:  Cane HEP, importance of posture/avoiding compensation   Recommended Other Services Pt reports that L wrist is not bothering him at this time (no pain) and does not feel that it needs to be addressed, wants to focus on R shoulder limitations.   Consulted and Agree with Plan of Care Patient      Patient will benefit from skilled therapeutic intervention in order to improve the following  deficits and impairments:  Pain, Improper body mechanics, Decreased range of motion, Decreased activity tolerance, Decreased knowledge of use of DME, Decreased strength, Impaired UE functional use, Impaired flexibility, Improper spinal/pelvic alignment  Visit Diagnosis: Stiffness of right shoulder, not elsewhere classified  Right shoulder pain, unspecified chronicity  Muscle weakness (generalized)  Abnormal posture    Problem List Patient Active Problem List   Diagnosis Date Noted  . Shoulder impingement syndrome, right 02/23/2016  . Left wrist tendinitis 02/23/2016  . Synovitis of toe 02/23/2016  . Bursitis of right shoulder 01/15/2014  . Hyperlipidemia 12/05/2008  . Asthma 12/05/2008    [facility] 03/08/2016, 4:20 PM  [facility] [address] [place], [place], [address] Phone: [phone]   Fax:  [phone]  Name: Tim Strickland MRN: [id number] Date of Birth: 25-Nov-1965   Vianne Bulls, OTR/L [facility] [address] [place], [place]  [address] [phone] phone [phone] 03/08/16 4:20 PM

## 2016-03-15 ENCOUNTER — Ambulatory Visit: Payer: BLUE CROSS/BLUE SHIELD | Admitting: Occupational Therapy

## 2016-03-15 DIAGNOSIS — M25511 Pain in right shoulder: Secondary | ICD-10-CM

## 2016-03-15 DIAGNOSIS — R293 Abnormal posture: Secondary | ICD-10-CM

## 2016-03-15 DIAGNOSIS — M25611 Stiffness of right shoulder, not elsewhere classified: Secondary | ICD-10-CM

## 2016-03-15 DIAGNOSIS — M6281 Muscle weakness (generalized): Secondary | ICD-10-CM

## 2016-03-15 NOTE — Therapy (Signed)
[facility] [address] [place], [place], [address] Phone: [phone]   Fax:  [phone]  Occupational Therapy Treatment  Patient Details  Name: Tim Strickland MRN: [id number] Date of Birth: [birth year] Referring Provider: Ples Specter. Calone  Encounter Date: 03/15/2016      OT End of Session - 03/15/16 0809    Visit Number 2   Number of Visits 17   Date for OT Re-Evaluation 05/05/16   Authorization Type BCBS--awaiting insurance verification   OT Start Time 0804   OT Stop Time 0845   OT Time Calculation (min) 41 min   Activity Tolerance Patient tolerated treatment well   Behavior During Therapy WFL for tasks assessed/performed      Past Medical History:  Diagnosis Date  . Asthma   . Elevated liver enzymes    PMH of  on Pravachol  . Hyperglycemia    A1c 5.2% in 2010  . Hyperlipidemia     Past Surgical History:  Procedure Laterality Date  . APPENDECTOMY    . TONSILLECTOMY    . VASECTOMY    . WISDOM TOOTH EXTRACTION      There were no vitals filed for this visit.      Subjective Assessment - 03/15/16 0808    Subjective  Pt reports that shoulder pain has been going on for approx 2 years.  Pt reports that L wrist pain is not bothering him at this time and that he does not feel that it needs to be addressed at this time.   Pertinent History R shoulder pain for approx 2 years, cortisone injection x1 approx 2 years ago   Limitations limiting work and leisure activities   Patient Stated Goals build up strength, decr tightness   Currently in Pain? Yes   Pain Score 2   no pain at rest   Pain Location --  shoulder   Pain Orientation Right   Pain Descriptors / Indicators Aching;Dull   Pain Type Chronic pain   Pain Onset More than a month ago   Pain Frequency Constant   Aggravating Factors  rotation   Pain Relieving Factors rest       In supine, lying with towel roll along spine for passive stretch  x34min then while therapist performing gentle joint mobs/soft tissue mobs to bilateral shoulders (incr emphasis on R).    In supine, reviewed  Cane HEP issued last session for self stretch.  (shoulder flex, abduction, ER, shoulder ext/ER for posture).  Pt returns demo each with pain only in abduction.  Pt instructed to only observe pain and cued for posture.    Myofascial release to lateral RUE (deltoid) due to tightness.              OT Treatments/Exercises (OP) - 03/15/16 0001      Modalities   Modalities Ultrasound     Ultrasound   Ultrasound Location shoulder (anterior and laterally around joint)   Ultrasound Parameters 42mhz, 1.1wts/cm2, 50% pulsed with no adverse reactions    Ultrasound Goals Pain  tightness                OT Education - 03/15/16 0921    Education Details Wt. bearing in quadraped (cat/cow positions for incr scapular mobility)   Person(s) Educated Patient   Methods Explanation;Demonstration;Verbal cues;Handout   Comprehension Verbalized understanding;Returned demonstration          OT Short Term Goals - 03/08/16 1611      OT SHORT TERM  GOAL #1   Title Pt will be independent with initial HEP for ROM.--check 04/07/16   Time 4   Period Weeks   Status New     OT SHORT TERM GOAL #2   Title Pt will report pain less than or equal to 5/10 or less with work/ADL tasks.   Baseline 3-8/10   Time 4   Period Weeks   Status New     OT SHORT TERM GOAL #3   Title Pt will demo at least 70* R shoulder abduction for lateral reaching.   Baseline 55*   Time 4   Period Weeks   Status New     OT SHORT TERM GOAL #4   Title Assess Quick DASH/Upper Extremity Functional Index and set goal as appropriate.   Time 4   Period Weeks   Status New           OT Long Term Goals - 03/08/16 1614      OT LONG TERM GOAL #1   Title Pt will be independent with updated/strengthening.--check 05/06/16   Time 8   Period Weeks   Status New     OT LONG TERM  GOAL #2   Title Pt will report pain less than or equal to 4/10 or less with work/ADL tasks.   Time 8   Period Weeks   Status New     OT LONG TERM GOAL #3   Title Pt will demo at least 85* R shoulder abduction for lateral reaching.   Baseline 55*   Time 8   Period Weeks   Status New     OT LONG TERM GOAL #4   Title Pt will demo good body mechanics/positioning during simulated work tasks.   Time 8   Period Weeks   Status New               Plan - 03/15/16 [id number]    Clinical Impression Statement Pt demo/reports improved AAROM/self PROM and reports incr ease with sleeping after HEP.  Decr posture and shoulder tightness appear to be contributing to pain.   Rehab Potential Good   Clinical Impairments Affecting Rehab Potential chronic nature of pain   OT Frequency 2x / week   OT Duration 8 weeks  +eval   OT Treatment/Interventions Self-care/ADL training;Therapeutic exercise;Electrical Stimulation;Iontophoresis;Splinting;Neuromuscular education;Parrafin;Moist Heat;Fluidtherapy;Energy conservation;Therapeutic exercises;Patient/family education;Functional Mobility Training;Passive range of motion;Therapeutic activities;Manual Therapy;DME and/or AE instruction;Contrast Bath;Ultrasound;Cryotherapy;Scar mobilization   Plan Assess Upper Extremity Functional Index/Quick DASH and write goal; posture, stretching, ultrasound   OT Home Exercise Plan Education provided:  Cane HEP, importance of posture/avoiding compensation   Consulted and Agree with Plan of Care Patient      Patient will benefit from skilled therapeutic intervention in order to improve the following deficits and impairments:  Pain, Improper body mechanics, Decreased range of motion, Decreased activity tolerance, Decreased knowledge of use of DME, Decreased strength, Impaired UE functional use, Impaired flexibility, Improper spinal/pelvic alignment  Visit Diagnosis: Right shoulder pain, unspecified chronicity  Stiffness of  right shoulder, not elsewhere classified  Muscle weakness (generalized)  Abnormal posture    Problem List Patient Active Problem List   Diagnosis Date Noted  . Shoulder impingement syndrome, right 02/23/2016  . Left wrist tendinitis 02/23/2016  . Synovitis of toe 02/23/2016  . Bursitis of right shoulder 01/15/2014  . Hyperlipidemia 12/05/2008  . Asthma 12/05/2008    [facility] 03/15/2016, 9:32 AM  [facility] [address] [place], [place], [address] Phone: [phone]  Fax:  [phone]  Name: PAOLA BURKINS MRN: [id number] Date of Birth: 09-24-1965   Vianne Bulls, OTR/L [facility] [address] [place], [place]  [address] [phone] phone [phone] 03/15/16 9:32 AM

## 2016-03-15 NOTE — Patient Instructions (Signed)
  http://cc.exer.us/60   Copyright  VHI. All rights reserved.  Angry Cat, All Fours    Kneel on hands and knees. Tuck chin and tighten stomach. Exhale and round back upward. Inhale and arch back downward. Hold each position 10 seconds. Repeat 10 times per session. Do 1 sessions per day.

## 2016-03-18 ENCOUNTER — Ambulatory Visit: Payer: BLUE CROSS/BLUE SHIELD | Admitting: Occupational Therapy

## 2016-03-18 DIAGNOSIS — R293 Abnormal posture: Secondary | ICD-10-CM

## 2016-03-18 DIAGNOSIS — M25511 Pain in right shoulder: Secondary | ICD-10-CM

## 2016-03-18 DIAGNOSIS — M25611 Stiffness of right shoulder, not elsewhere classified: Secondary | ICD-10-CM

## 2016-03-18 DIAGNOSIS — M6281 Muscle weakness (generalized): Secondary | ICD-10-CM

## 2016-03-18 NOTE — Therapy (Signed)
[facility] [address] [place], [place], [address] Phone: [phone]   Fax:  [phone]  Occupational Therapy Treatment  Patient Details  Name: [name] MRN: [id number] Date of Birth: [birth year] Referring Provider: Ples Specter. Calone  Encounter Date: 03/18/2016      OT End of Session - 03/18/16 1028    Visit Number 3   Number of Visits 17   Date for OT Re-Evaluation 05/05/16   Authorization Type BCBS--awaiting insurance verification   OT Start Time 0805   OT Stop Time 0845   OT Time Calculation (min) 40 min   Activity Tolerance Patient tolerated treatment well   Behavior During Therapy WFL for tasks assessed/performed      Past Medical History:  Diagnosis Date  . Asthma   . Elevated liver enzymes    PMH of  on Pravachol  . Hyperglycemia    A1c 5.2% in 2010  . Hyperlipidemia     Past Surgical History:  Procedure Laterality Date  . APPENDECTOMY    . TONSILLECTOMY    . VASECTOMY    . WISDOM TOOTH EXTRACTION      There were no vitals filed for this visit.      Subjective Assessment - 03/18/16 0839    Pertinent History R shoulder pain for approx 2 years, cortisone injection x1 approx 2 years ago   Patient Stated Goals build up strength, decr tightness   Currently in Pain? Yes   Pain Score 2   to 7/10 with malpositioning   Pain Location Shoulder   Pain Descriptors / Indicators Aching   Pain Type Chronic pain   Pain Onset More than a month ago   Pain Frequency Constant   Aggravating Factors  rotation   Pain Relieving Factors rest         In supine, lying with towel roll along spine for passive stretch x50min then while therapist performing gentle joint mobs to right  shoulder  Butterfly stretch with hands behind head x 10 no reports of pain.  In supine, reviewed  Cane HEP issued last session for self stretch.  (shoulder flex, abduction, ER, shoulder ext/ER for posture).  Pt returns  demo each with pain only in abduction.  Pt instructed to only observe pain and cued for posture.Therapsit d/c abduction for the time being  Cat and cow positions, wall pushups x 10 reps each, min v.c. Prone scapular stability exercises with arms in ext and abduction, 10 reps each, min v.c.              OT Treatments/Exercises (OP) - 03/15/16 0001      Modalities   Modalities Ultrasound     Ultrasound   Ultrasound Location shoulder (anterior and laterally around joint)   Ultrasound Parameters 30mhz, 1.1wts/cm2, 50% pulsed with no adverse reactions    Ultrasound Goals Pain  tightness                          OT Education - 03/18/16 1024    Education provided Yes   Education Details prone scapular stability exercise with arms in ext and abduction   Person(s) Educated Patient   Methods Explanation;Demonstration   Comprehension Verbalized understanding;Returned demonstration;Verbal cues required          OT Short Term Goals - 03/08/16 1611      OT SHORT TERM GOAL #1   Title Pt will be independent with initial HEP for ROM.--check 04/07/16  Time 4   Period Weeks   Status New     OT SHORT TERM GOAL #2   Title Pt will report pain less than or equal to 5/10 or less with work/ADL tasks.   Baseline 3-8/10   Time 4   Period Weeks   Status New     OT SHORT TERM GOAL #3   Title Pt will demo at least 70* R shoulder abduction for lateral reaching.   Baseline 55*   Time 4   Period Weeks   Status New     OT SHORT TERM GOAL #4   Title Assess Quick DASH/Upper Extremity Functional Index and set goal as appropriate.   Time 4   Period Weeks   Status New           OT Long Term Goals - 03/08/16 1614      OT LONG TERM GOAL #1   Title Pt will be independent with updated/strengthening.--check 05/06/16   Time 8   Period Weeks   Status New     OT LONG TERM GOAL #2   Title Pt will report pain less than or equal to 4/10 or less with work/ADL tasks.    Time 8   Period Weeks   Status New     OT LONG TERM GOAL #3   Title Pt will demo at least 85* R shoulder abduction for lateral reaching.   Baseline 55*   Time 8   Period Weeks   Status New     OT LONG TERM GOAL #4   Title Pt will demo good body mechanics/positioning during simulated work tasks.   Time 8   Period Weeks   Status New               Plan - 03/18/16 1025    Clinical Impression Statement Pt reports decreased pain today. He reports the stretches are going well. Therapist had pt temporarily discontinue his shoulder abduction  ex as he is exeperincing pain   Rehab Potential Good   Clinical Impairments Affecting Rehab Potential chronic nature of pain   OT Frequency 2x / week   OT Duration 8 weeks   OT Treatment/Interventions Self-care/ADL training;Therapeutic exercise;Electrical Stimulation;Iontophoresis;Splinting;Neuromuscular education;Parrafin;Moist Heat;Fluidtherapy;Energy conservation;Therapeutic exercises;Patient/family education;Functional Mobility Training;Passive range of motion;Therapeutic activities;Manual Therapy;DME and/or AE instruction;Contrast Bath;Ultrasound;Cryotherapy;Scar mobilization   Plan assess UE functional index/ Quick Dash and set goal , [place], scapular stability   OT Home Exercise Plan Education provided:  Cane HEP, importance of posture/avoiding compensation, (d/c shoulder abduction ex due to pain) scapular stability prone   Consulted and Agree with Plan of Care Patient      Patient will benefit from skilled therapeutic intervention in order to improve the following deficits and impairments:  Pain, Improper body mechanics, Decreased range of motion, Decreased activity tolerance, Decreased knowledge of use of DME, Decreased strength, Impaired UE functional use, Impaired flexibility, Improper spinal/pelvic alignment  Visit Diagnosis: Stiffness of right shoulder, not elsewhere classified  Right shoulder pain, unspecified chronicity  Muscle  weakness (generalized)  Abnormal posture    Problem List Patient Active Problem List   Diagnosis Date Noted  . Shoulder impingement syndrome, right 02/23/2016  . Left wrist tendinitis 02/23/2016  . Synovitis of toe 02/23/2016  . Bursitis of right shoulder 01/15/2014  . Hyperlipidemia 12/05/2008  . Asthma 12/05/2008    Tim Strickland 03/18/2016, 10:29 AM  [facility] [address] [place], [place], [address] Phone: [phone]   Fax:  [phone]  Name:  ROYSE SEWER MRN: [id number] Date of Birth: 11-03-1965

## 2016-03-22 ENCOUNTER — Ambulatory Visit: Payer: BLUE CROSS/BLUE SHIELD | Admitting: Occupational Therapy

## 2016-03-22 DIAGNOSIS — M25611 Stiffness of right shoulder, not elsewhere classified: Secondary | ICD-10-CM

## 2016-03-22 DIAGNOSIS — M6281 Muscle weakness (generalized): Secondary | ICD-10-CM

## 2016-03-22 DIAGNOSIS — M25511 Pain in right shoulder: Secondary | ICD-10-CM

## 2016-03-22 NOTE — Therapy (Signed)
[facility] [address] [place], [place], [address] Phone: [phone]   Fax:  [phone]  Occupational Therapy Treatment  Patient Details  Name: Tim Strickland MRN: [id number] Date of Birth: October 31, 1965 Referring Provider: Ples Specter. Calone  Encounter Date: 03/22/2016      OT End of Session - 03/22/16 1005    Visit Number 4   Number of Visits 17   Date for OT Re-Evaluation 05/05/16   Authorization Type BCBS      Past Medical History:  Diagnosis Date  . Asthma   . Elevated liver enzymes    PMH of  on Pravachol  . Hyperglycemia    A1c 5.2% in 2010  . Hyperlipidemia     Past Surgical History:  Procedure Laterality Date  . APPENDECTOMY    . TONSILLECTOMY    . VASECTOMY    . WISDOM TOOTH EXTRACTION      There were no vitals filed for this visit.      Subjective Assessment - 03/22/16 0955    Subjective  Pt reports shoulder pain was worse over the weeken, pt states pain was better after he left therapy last week.   Pertinent History R shoulder pain for approx 2 years, cortisone injection x1 approx 2 years ago   Limitations limiting work and leisure activities   Patient Stated Goals build up strength, decr tightness   Currently in Pain? Yes   Pain Score 5    Pain Location Shoulder  scapula   Pain Orientation Right   Pain Descriptors / Indicators Aching   Pain Type Chronic pain   Pain Onset More than a month ago   Pain Frequency Constant   Aggravating Factors  rotation   Pain Relieving Factors rest            Treatment: [place] 29mhz, 0.8 w/cm 2, 20 % at right scapula and border, followed by [place] to right shoulder and upper arm 1 mhz, 1.1 w/cm 2 20% x 7 mins due to pain. Soft tissue mobs to trigger point at right scapular border. Kinesiotape applied to scapula to promote retraction, pt educated in wear, care and precautions. Pt verbalized understanding. In supine, lying with towel roll along spine  for passive stretch  In supine, chest press and shoulder flexion within tolerated ROM, min-mod facilitation/ v.c. Cat and cow positions x 10 reps Pt was instructed to be aware of posture and to avoid activities that provoke the pain.                         OT Short Term Goals - 03/08/16 1611      OT SHORT TERM GOAL #1   Title Pt will be independent with initial HEP for ROM.--check 04/07/16   Time 4   Period Weeks   Status New     OT SHORT TERM GOAL #2   Title Pt will report pain less than or equal to 5/10 or less with work/ADL tasks.   Baseline 3-8/10   Time 4   Period Weeks   Status New     OT SHORT TERM GOAL #3   Title Pt will demo at least 70* R shoulder abduction for lateral reaching.   Baseline 55*   Time 4   Period Weeks   Status New     OT SHORT TERM GOAL #4   Title Assess Quick DASH/Upper Extremity Functional Index and set goal as appropriate.   Time 4  Period Weeks   Status New           OT Long Term Goals - 03/08/16 1614      OT LONG TERM GOAL #1   Title Pt will be independent with updated/strengthening.--check 05/06/16   Time 8   Period Weeks   Status New     OT LONG TERM GOAL #2   Title Pt will report pain less than or equal to 4/10 or less with work/ADL tasks.   Time 8   Period Weeks   Status New     OT LONG TERM GOAL #3   Title Pt will demo at least 85* R shoulder abduction for lateral reaching.   Baseline 55*   Time 8   Period Weeks   Status New     OT LONG TERM GOAL #4   Title Pt will demo good body mechanics/positioning during simulated work tasks.   Time 8   Period Weeks   Status New               Plan - 03/22/16 0959    Clinical Impression Statement Pt reports increased pain and weakness this weekend.   Rehab Potential Fair   Clinical Impairments Affecting Rehab Potential chronic nature of pain   OT Frequency 2x / week   OT Duration 8 weeks   OT Treatment/Interventions Self-care/ADL  training;Therapeutic exercise;Electrical Stimulation;Iontophoresis;Splinting;Neuromuscular education;Parrafin;Moist Heat;Fluidtherapy;Energy conservation;Therapeutic exercises;Patient/family education;Functional Mobility Training;Passive range of motion;Therapeutic activities;Manual Therapy;DME and/or AE instruction;Contrast Bath;Ultrasound;Cryotherapy;Scar mobilization   Plan [place], scapular stability    OT Home Exercise Plan Education provided:  Cane HEP, importance of posture/avoiding compensation, (d/c shoulder abduction ex due to pain) scapular stability prone   Consulted and Agree with Plan of Care Patient      Patient will benefit from skilled therapeutic intervention in order to improve the following deficits and impairments:  Pain, Improper body mechanics, Decreased range of motion, Decreased activity tolerance, Decreased knowledge of use of DME, Decreased strength, Impaired UE functional use, Impaired flexibility, Improper spinal/pelvic alignment  Visit Diagnosis: Stiffness of right shoulder, not elsewhere classified  Right shoulder pain, unspecified chronicity  Muscle weakness (generalized)    Problem List Patient Active Problem List   Diagnosis Date Noted  . Shoulder impingement syndrome, right 02/23/2016  . Left wrist tendinitis 02/23/2016  . Synovitis of toe 02/23/2016  . Bursitis of right shoulder 01/15/2014  . Hyperlipidemia 12/05/2008  . Asthma 12/05/2008    RINE,KATHRYN 03/22/2016, 10:05 AM  [facility] [address] [place], [place], [address] Phone: [phone]   Fax:  [phone]  Name: Tim Strickland MRN: [id number] Date of Birth: [birth year]

## 2016-03-24 ENCOUNTER — Encounter: Payer: BLUE CROSS/BLUE SHIELD | Admitting: Occupational Therapy

## 2016-03-29 ENCOUNTER — Ambulatory Visit: Payer: BLUE CROSS/BLUE SHIELD | Admitting: Occupational Therapy

## 2016-03-29 DIAGNOSIS — M25511 Pain in right shoulder: Secondary | ICD-10-CM

## 2016-03-29 DIAGNOSIS — M6281 Muscle weakness (generalized): Secondary | ICD-10-CM

## 2016-03-29 DIAGNOSIS — M25611 Stiffness of right shoulder, not elsewhere classified: Secondary | ICD-10-CM

## 2016-03-29 NOTE — Therapy (Addendum)
[facility] [address] [place], [place], [address] Phone: [phone]   Fax:  [phone]  Occupational Therapy Treatment  Patient Details  Name: ELENO VADEN MRN: [id number] Date of Birth: [birth year] Referring Provider: Ples Specter. Calone  Encounter Date: 03/29/2016      OT End of Session - 03/29/16 0820    Visit Number 5   Number of Visits 17   Date for OT Re-Evaluation 05/05/16   Authorization Type BCBS   OT Start Time 0804   OT Stop Time 0849   OT Time Calculation (min) 45 min   Activity Tolerance Patient tolerated treatment well   Behavior During Therapy [facility] for tasks assessed/performed      Past Medical History:  Diagnosis Date  . Asthma   . Elevated liver enzymes    PMH of  on Pravachol  . Hyperglycemia    A1c 5.2% in 2010  . Hyperlipidemia     Past Surgical History:  Procedure Laterality Date  . APPENDECTOMY    . TONSILLECTOMY    . VASECTOMY    . WISDOM TOOTH EXTRACTION      There were no vitals filed for this visit.      Subjective Assessment - 03/29/16 0805    Subjective  "It's been really bothering me.  It's weather related"  Pt reports that he didn't ntice a difference with kinesiotape   Pertinent History R shoulder pain for approx 2 years, cortisone injection x1 approx 2 years ago   Limitations limiting work and leisure activities   Patient Stated Goals build up strength, decr tightness   Currently in Pain? Yes   Pain Score 4    Pain Location Shoulder   Pain Orientation Right   Pain Descriptors / Indicators Aching   Pain Type Chronic pain   Pain Onset More than a month ago   Pain Frequency Constant   Aggravating Factors  rotation, rainy/cold weather   Pain Relieving Factors rest         Quadraped cat/cow positions for incr scapular stability and mobility with no reports of pain.  Corner/doorframe stretch with min cueing for proper positioning/avoiding shoulder  hike x5 with 10sec hold (pt reports that he does this at home--pt to continue but be more aware of positioning).  In tall kneeling on mat, rolling ball out for AAROM shoulder flex with min cueing for incr core/scapular stability.  In prone, scapular retraction/depression with shoulders in extension.  In supine, scapular depression/retraction to with trunk ext to lift chest for inc scapular stability.  In sitting, AROM shoulder flex and abduction with focus on posture/positioning,  normal movement patterns.                 OT Treatments/Exercises (OP) - 03/29/16 0001      Ultrasound   Ultrasound Location shoulder (anterior and lateral)   Ultrasound Parameters 30mhz, 1.1wts/cm2 continuous with no adverse reactions   Ultrasound Goals Pain  tightness                  OT Short Term Goals - 03/08/16 1611      OT SHORT TERM GOAL #1   Title Pt will be independent with initial HEP for ROM.--check 04/07/16   Time 4   Period Weeks   Status New     OT SHORT TERM GOAL #2   Title Pt will report pain less than or equal to 5/10 or less with work/ADL tasks.   Baseline 3-8/10  Time 4   Period Weeks   Status New     OT SHORT TERM GOAL #3   Title Pt will demo at least 70* R shoulder abduction for lateral reaching.   Baseline 55*   Time 4   Period Weeks   Status New     OT SHORT TERM GOAL #4   Title Assess Quick DASH/Upper Extremity Functional Index and set goal as appropriate.   Time 4   Period Weeks   Status New           OT Long Term Goals - 03/08/16 1614      OT LONG TERM GOAL #1   Title Pt will be independent with updated/strengthening.--check 05/06/16   Time 8   Period Weeks   Status New     OT LONG TERM GOAL #2   Title Pt will report pain less than or equal to 4/10 or less with work/ADL tasks.   Time 8   Period Weeks   Status New     OT LONG TERM GOAL #3   Title Pt will demo at least 85* R shoulder abduction for lateral reaching.   Baseline 55*    Time 8   Period Weeks   Status New     OT LONG TERM GOAL #4   Title Pt will demo good body mechanics/positioning during simulated work tasks.   Time 8   Period Weeks   Status New               Plan - 03/29/16 0820    Clinical Impression Statement Pt reports incr pain due to weather.  Pt with significant R scapular and core weakness contributing to malpositioning with movement as well as decr awareness of positioning/posture at times with guarding postures due to pain.   Rehab Potential Fair   Clinical Impairments Affecting Rehab Potential chronic nature of pain   OT Frequency 2x / week   OT Duration 8 weeks   OT Treatment/Interventions Self-care/ADL training;Therapeutic exercise;Electrical Stimulation;Iontophoresis;Splinting;Neuromuscular education;Parrafin;Moist Heat;Fluidtherapy;Energy conservation;Therapeutic exercises;Patient/family education;Functional Mobility Training;Passive range of motion;Therapeutic activities;Manual Therapy;DME and/or AE instruction;Contrast Bath;Ultrasound;Cryotherapy;Scar mobilization   Plan ?schedule additional visits; Ultrasound, scapular stability, add to HEP   OT Home Exercise Plan Education provided:  Cane HEP, importance of posture/avoiding compensation, (d/c shoulder abduction ex due to pain) scapular stability prone   Consulted and Agree with Plan of Care Patient      Patient will benefit from skilled therapeutic intervention in order to improve the following deficits and impairments:  Pain, Improper body mechanics, Decreased range of motion, Decreased activity tolerance, Decreased knowledge of use of DME, Decreased strength, Impaired UE functional use, Impaired flexibility, Improper spinal/pelvic alignment  Visit Diagnosis: Right shoulder pain, unspecified chronicity  Stiffness of right shoulder, not elsewhere classified  Muscle weakness (generalized)    Problem List Patient Active Problem List   Diagnosis Date Noted  . Shoulder  impingement syndrome, right 02/23/2016  . Left wrist tendinitis 02/23/2016  . Synovitis of toe 02/23/2016  . Bursitis of right shoulder 01/15/2014  . Hyperlipidemia 12/05/2008  . Asthma 12/05/2008    [facility] 03/29/2016, 1:24 PM  [facility] [address] [place], [place], [address] Phone: [phone]   Fax:  [phone]  Name: DAILYN AMBERGER MRN: [id number] Date of Birth: 22-Apr-1965   Vianne Bulls, OTR/L [facility] [address] [place], [place]  [address] [phone] phone [phone] 03/29/16 1:24 PM

## 2016-04-01 ENCOUNTER — Ambulatory Visit: Payer: BLUE CROSS/BLUE SHIELD | Admitting: Occupational Therapy

## 2016-04-01 DIAGNOSIS — M25511 Pain in right shoulder: Secondary | ICD-10-CM

## 2016-04-01 DIAGNOSIS — M25611 Stiffness of right shoulder, not elsewhere classified: Secondary | ICD-10-CM | POA: Diagnosis not present

## 2016-04-01 DIAGNOSIS — M6281 Muscle weakness (generalized): Secondary | ICD-10-CM

## 2016-04-01 NOTE — Patient Instructions (Addendum)
Strengthening: Resisted Extension    Hold tubing in both hands hand, arm forward. Pull arm back, elbow straight. Repeat __10-15__ times per set. Do _1___ sets per session. Do __1__ sessions per day. Perform with both arms, repeat x 10 reps with elbows bent, stop if you have pain.  [email or web address]://[email or web address].exer.[email or web address]

## 2016-04-01 NOTE — Therapy (Signed)
[facility] [address] [place], [place], [address] Phone: [phone]   Fax:  [phone]  Occupational Therapy Treatment  Patient Details  Name: Tim Strickland MRN: [id number] Date of Birth: [birth year] Referring Provider: Ples Specter. Calone  Encounter Date: 04/01/2016      OT End of Session - 04/01/16 0901    Visit Number 6   Number of Visits 17   Date for OT Re-Evaluation 05/05/16   Authorization Type BCBS   OT Start Time 0805   OT Stop Time 0850   OT Time Calculation (min) 45 min      Past Medical History:  Diagnosis Date  . Asthma   . Elevated liver enzymes    PMH of  on Pravachol  . Hyperglycemia    A1c 5.2% in 2010  . Hyperlipidemia     Past Surgical History:  Procedure Laterality Date  . APPENDECTOMY    . TONSILLECTOMY    . VASECTOMY    . WISDOM TOOTH EXTRACTION      There were no vitals filed for this visit.      Subjective Assessment - 04/01/16 0859    Pertinent History R shoulder pain for approx 2 years, cortisone injection x1 approx 2 years ago   Limitations limiting work and leisure activities   Patient Stated Goals build up strength, decr tightness   Currently in Pain? Yes   Pain Score 4    Pain Location Shoulder   Pain Orientation Right   Pain Descriptors / Indicators Aching   Pain Type Chronic pain   Pain Onset More than a month ago   Pain Frequency Constant   Aggravating Factors  rotation   Pain Relieving Factors rest, ultrasound   Multiple Pain Sites No           TreatmentQuadraped cat/cow positions for incr scapular stability and mobility with no reports of pain.  Corner/doorframe stretch with min cueing for proper positioning/avoiding shoulder hike x5 with 10sec hold (pt reports that he does this at home--pt to continue but be more aware of positioning).  In tall kneeling on mat, rolling ball out for AAROM shoulder flex with min cueing for incr core/scapular  stability.  In prone, scapular retraction/depression with shoulders in extension.  Prone on elbows scapular depression/retraction with trunk ext, min v.c. For position  In sitting, AROM shoulder flex and abduction with cane held vertically, (pt attempted with pvc pipe frame yet pain and weakness requiring AA/ROM) with focus on posture/positioning,  normal movement patterns.    Pt was instructed in red theraband for rowing shoulder ext, see pt instructions, pt denies pain. Issued as HEP, pt returned demonstration.              OT Treatments/Exercises (OP) - 03/29/16 0001      Ultrasound   Ultrasound Location shoulder (anterior and lateral)   Ultrasound Parameters 33mhz, 1.1wts/cm2 continuous with no adverse reactions   Ultrasound Goals Pain  tightness                               OT Education - 04/01/16 0904    Education provided Yes   Education Details rowing and shoulder ext with red theraband, bilateral   Person(s) Educated Patient   Methods Explanation;Demonstration;Verbal cues;Handout   Comprehension Verbalized understanding;Returned demonstration;Verbal cues required          OT Short Term Goals - 03/08/16 1611  OT SHORT TERM GOAL #1   Title Pt will be independent with initial HEP for ROM.--check 04/07/16   Time 4   Period Weeks   Status New     OT SHORT TERM GOAL #2   Title Pt will report pain less than or equal to 5/10 or less with work/ADL tasks.   Baseline 3-8/10   Time 4   Period Weeks   Status New     OT SHORT TERM GOAL #3   Title Pt will demo at least 70* R shoulder abduction for lateral reaching.   Baseline 55*   Time 4   Period Weeks   Status New     OT SHORT TERM GOAL #4   Title Assess Quick DASH/Upper Extremity Functional Index and set goal as appropriate.   Time 4   Period Weeks   Status New           OT Long Term Goals - 03/08/16 1614      OT LONG TERM GOAL #1   Title Pt will be independent with  updated/strengthening.--check 05/06/16   Time 8   Period Weeks   Status New     OT LONG TERM GOAL #2   Title Pt will report pain less than or equal to 4/10 or less with work/ADL tasks.   Time 8   Period Weeks   Status New     OT LONG TERM GOAL #3   Title Pt will demo at least 85* R shoulder abduction for lateral reaching.   Baseline 55*   Time 8   Period Weeks   Status New     OT LONG TERM GOAL #4   Title Pt will demo good body mechanics/positioning during simulated work tasks.   Time 8   Period Weeks   Status New               Plan - 04/01/16 [id number]    Clinical Impression Statement Pt agrees with plans to continue therapy. Pt demonstrates understanding of red theraband HEP for rowing and shoulder ext.   Rehab Potential Fair   Clinical Impairments Affecting Rehab Potential chronic nature of pain   OT Frequency 2x / week   OT Duration 8 weeks   OT Treatment/Interventions Self-care/ADL training;Therapeutic exercise;Electrical Stimulation;Iontophoresis;Splinting;Neuromuscular education;Parrafin;Moist Heat;Fluidtherapy;Energy conservation;Therapeutic exercises;Patient/family education;Functional Mobility Training;Passive range of motion;Therapeutic activities;Manual Therapy;DME and/or AE instruction;Contrast Bath;Ultrasound;Cryotherapy;Scar mobilization   Plan check on theraband HEP   OT Home Exercise Plan Education provided:  Cane HEP, importance of posture/avoiding compensation, (d/c shoulder abduction ex due to pain) scapular stability prone, theraband for shoulder ext/ rowing.   Consulted and Agree with Plan of Care Patient      Patient will benefit from skilled therapeutic intervention in order to improve the following deficits and impairments:  Pain, Improper body mechanics, Decreased range of motion, Decreased activity tolerance, Decreased knowledge of use of DME, Decreased strength, Impaired UE functional use, Impaired flexibility, Improper spinal/pelvic  alignment  Visit Diagnosis: Stiffness of right shoulder, not elsewhere classified  Right shoulder pain, unspecified chronicity  Muscle weakness (generalized)    Problem List Patient Active Problem List   Diagnosis Date Noted  . Shoulder impingement syndrome, right 02/23/2016  . Left wrist tendinitis 02/23/2016  . Synovitis of toe 02/23/2016  . Bursitis of right shoulder 01/15/2014  . Hyperlipidemia 12/05/2008  . Asthma 12/05/2008    RINE,KATHRYN 04/01/2016, 9:07 AM  [facility] [address] [place], [place], [address] Phone: [phone]  Fax:  [phone]  Name: Tim Strickland MRN: [id number] Date of Birth: 1965-08-15

## 2016-04-05 ENCOUNTER — Ambulatory Visit: Payer: BLUE CROSS/BLUE SHIELD | Admitting: Occupational Therapy

## 2016-04-05 DIAGNOSIS — M6281 Muscle weakness (generalized): Secondary | ICD-10-CM

## 2016-04-05 DIAGNOSIS — M25611 Stiffness of right shoulder, not elsewhere classified: Secondary | ICD-10-CM

## 2016-04-05 DIAGNOSIS — M25511 Pain in right shoulder: Secondary | ICD-10-CM

## 2016-04-05 DIAGNOSIS — R293 Abnormal posture: Secondary | ICD-10-CM

## 2016-04-05 NOTE — Patient Instructions (Signed)
1.  In sitting, put hands behind you on the bed, look up while lifting chest so that your shoulder blades go back together and down.  Hold 10sec.  10 reps  2.  Laying on your stomach, prop yourself up on your elbows and lift head chest.  Focus on shoulder blades going back and down.  Hold 10sec.  10 reps.  3.  Laying on your stomach, hold 2lb wt. In your hand and let arm hang off bed.  Slowly raise arm toward hip with elbow straight.  Keep hand close to your side.  10 reps.  Rest and then do 10 more.  4.  Continue to perform doorway stretch.   **Pay close attention to positioning!

## 2016-04-05 NOTE — Therapy (Signed)
[facility] [address] [place], [place], [address] Phone: [phone]   Fax:  [phone]  Occupational Therapy Treatment  Patient Details  Name: Tim Strickland MRN: [id number] Date of Birth: April 07, 1965 Referring Provider: Ples Specter. Calone  Encounter Date: 04/05/2016      OT End of Session - 04/05/16 1016    Visit Number 7   Number of Visits 17   Date for OT Re-Evaluation 05/05/16   Authorization Type BCBS   OT Start Time 0809   OT Stop Time 0849   OT Time Calculation (min) 40 min   Activity Tolerance Patient tolerated treatment well   Behavior During Therapy WFL for tasks assessed/performed      Past Medical History:  Diagnosis Date  . Asthma   . Elevated liver enzymes    PMH of  on Pravachol  . Hyperglycemia    A1c 5.2% in 2010  . Hyperlipidemia     Past Surgical History:  Procedure Laterality Date  . APPENDECTOMY    . TONSILLECTOMY    . VASECTOMY    . WISDOM TOOTH EXTRACTION      There were no vitals filed for this visit.      Subjective Assessment - 04/05/16 1015    Subjective  Pt reports that he wants to focus on posture and strengthening.     Pertinent History R shoulder pain for approx 2 years, cortisone injection x1 approx 2 years ago   Limitations limiting work and leisure activities   Patient Stated Goals build up strength, decr tightness   Currently in Pain? Yes   Pain Score 2    Pain Location Shoulder   Pain Orientation Right   Pain Descriptors / Indicators Sore   Pain Type Chronic pain   Pain Onset More than a month ago   Pain Frequency Constant   Aggravating Factors  rotation, improper positioning.   Pain Relieving Factors rest, ultrasound, proper positioning           OT Treatments/Exercises (OP) - 03/29/16 0001      Ultrasound   Ultrasound Location shoulder (anterior and lateral)   Ultrasound Parameters 64mhz, 1.0wts/cm2 continuous with no adverse reactions   Ultrasound Goals Pain  tightness    AROM shoulder flex and abduction with ROM approx [facility] today without pain with min v.c. For posture/positioning/normal movemnet patterns.  Pt reports that this can be inconsistent.  In prone, R shoulder ext off edge of mat with 2lb wt. With min cueing for positioning.  Sitting on mat with shoulders positioned in extension/ER with scapular retraction/depression and wt. Bearing through hands.  Corner/doorframe stretch for ER with min cueing for proper positioning/avoiding shoulder hike x5 with 10sec hold   In supine, scapular depression/retraction to with trunk ext to lift chest for inc scapular stability.  Standing at wall for isometric scapular depression/retraction with UEs in low range abduction, min cueing to avoid shoulder hike and for elbow ext.  In modified quadraped, forward/back wt. Shifts with min cueing for positioning for stretch and incr scapular stability..                           OT Education - 04/05/16 1304    Education Details HEP updates--see pt instructions   Person(s) Educated Patient   Methods Explanation;Demonstration;Handout   Comprehension Verbalized understanding;Returned demonstration;Verbal cues required          OT Short Term Goals - 04/05/16 1323  OT SHORT TERM GOAL #1   Title Pt will be independent with initial HEP for ROM.--check 04/07/16   Time 4   Period Weeks   Status Achieved     OT SHORT TERM GOAL #2   Title Pt will report pain less than or equal to 5/10 or less with work/ADL tasks.   Baseline 3-8/10   Time 4   Period Weeks   Status New     OT SHORT TERM GOAL #3   Title Pt will demo at least 70* R shoulder abduction for lateral reaching.   Baseline 55*   Time 4   Period Weeks   Status New     OT SHORT TERM GOAL #4   Title Assess Quick DASH/Upper Extremity Functional Index and set goal as appropriate.   Time 4   Period Weeks   Status New           OT Long Term  Goals - 03/08/16 1614      OT LONG TERM GOAL #1   Title Pt will be independent with updated/strengthening.--check 05/06/16   Time 8   Period Weeks   Status New     OT LONG TERM GOAL #2   Title Pt will report pain less than or equal to 4/10 or less with work/ADL tasks.   Time 8   Period Weeks   Status New     OT LONG TERM GOAL #3   Title Pt will demo at least 85* R shoulder abduction for lateral reaching.   Baseline 55*   Time 8   Period Weeks   Status New     OT LONG TERM GOAL #4   Title Pt will demo good body mechanics/positioning during simulated work tasks.   Time 8   Period Weeks   Status New               Plan - 04/05/16 1017    Clinical Impression Statement Pt demo improved ROM at beginning of the session, but fatigues quickly.  Continues to need cues for posture/positioning.   Rehab Potential Fair   Clinical Impairments Affecting Rehab Potential chronic nature of pain   OT Frequency 2x / week   OT Duration 8 weeks   OT Treatment/Interventions Self-care/ADL training;Therapeutic exercise;Electrical Stimulation;Iontophoresis;Splinting;Neuromuscular education;Parrafin;Moist Heat;Fluidtherapy;Energy conservation;Therapeutic exercises;Patient/family education;Functional Mobility Training;Passive range of motion;Therapeutic activities;Manual Therapy;DME and/or AE instruction;Contrast Bath;Ultrasound;Cryotherapy;Scar mobilization   Plan review/check on theraband HEP, check STGs   OT Home Exercise Plan Education provided:  Cane HEP, importance of posture/avoiding compensation, (d/c shoulder abduction ex due to pain) scapular stability prone, theraband for shoulder ext/ rowing.   Consulted and Agree with Plan of Care Patient      Patient will benefit from skilled therapeutic intervention in order to improve the following deficits and impairments:  Pain, Improper body mechanics, Decreased range of motion, Decreased activity tolerance, Decreased knowledge of use of DME,  Decreased strength, Impaired UE functional use, Impaired flexibility, Improper spinal/pelvic alignment  Visit Diagnosis: Right shoulder pain, unspecified chronicity  Stiffness of right shoulder, not elsewhere classified  Muscle weakness (generalized)  Abnormal posture    Problem List Patient Active Problem List   Diagnosis Date Noted  . Shoulder impingement syndrome, right 02/23/2016  . Left wrist tendinitis 02/23/2016  . Synovitis of toe 02/23/2016  . Bursitis of right shoulder 01/15/2014  . Hyperlipidemia 12/05/2008  . Asthma 12/05/2008    [facility] 04/05/2016, 1:23 PM  [facility] [address] [place], [place],  [id number] Phone: [phone]   Fax:  [phone]  Name: Tim Strickland MRN: [id number] Date of Birth: 1965/04/10   Vianne Bulls, OTR/L [facility] [address] [place], [place]  [address] [phone] phone [phone] 04/05/16 1:23 PM

## 2016-04-08 ENCOUNTER — Ambulatory Visit: Payer: BLUE CROSS/BLUE SHIELD | Attending: Family | Admitting: Occupational Therapy

## 2016-04-08 DIAGNOSIS — R293 Abnormal posture: Secondary | ICD-10-CM | POA: Insufficient documentation

## 2016-04-08 DIAGNOSIS — M25611 Stiffness of right shoulder, not elsewhere classified: Secondary | ICD-10-CM | POA: Diagnosis present

## 2016-04-08 DIAGNOSIS — M6281 Muscle weakness (generalized): Secondary | ICD-10-CM | POA: Diagnosis present

## 2016-04-08 DIAGNOSIS — M25511 Pain in right shoulder: Secondary | ICD-10-CM | POA: Insufficient documentation

## 2016-04-08 NOTE — Therapy (Signed)
[facility] [address] [place], [place], [address] Phone: [phone]   Fax:  [phone]  Occupational Therapy Treatment  Patient Details  Name: Tim Strickland MRN: [id number] Date of Birth: 1965-06-02 Referring Provider: Ples Specter. Calone  Encounter Date: 04/08/2016      OT End of Session - 04/08/16 0836    Visit Number 8   Number of Visits 17   Date for OT Re-Evaluation 05/05/16   Authorization Type BCBS   OT Start Time 0804   OT Stop Time 0845   OT Time Calculation (min) 41 min   Activity Tolerance Patient tolerated treatment well   Behavior During Therapy WFL for tasks assessed/performed      Past Medical History:  Diagnosis Date  . Asthma   . Elevated liver enzymes    PMH of  on Pravachol  . Hyperglycemia    A1c 5.2% in 2010  . Hyperlipidemia     Past Surgical History:  Procedure Laterality Date  . APPENDECTOMY    . TONSILLECTOMY    . VASECTOMY    . WISDOM TOOTH EXTRACTION      There were no vitals filed for this visit.      Subjective Assessment - 04/08/16 0835    Subjective  Pt reports incresed pain last 2 days   Pertinent History R shoulder pain for approx 2 years, cortisone injection x1 approx 2 years ago   Limitations limiting work and leisure activities   Patient Stated Goals build up strength, decr tightness   Currently in Pain? Yes   Pain Score 5    Pain Orientation Right   Pain Descriptors / Indicators Aching   Pain Type Chronic pain   Pain Frequency Constant   Aggravating Factors  malpositioning   Pain Relieving Factors ultrasound, stretching            OT Treatments/Exercises (OP) - 03/29/16 0001      Ultrasound   Ultrasound Location shoulder (anterior and lateral)   Ultrasound Parameters 5mhz, 1.1wts/cm2 continuous with no adverse reactions   Ultrasound Goals Pain  tightness    AROM shoulder flex limited by pain, pt was bale to perform with vertical cane,  withh min v.c. For posture/positioning/normal movement  patterns.  Pt reports that this can be inconsistent. Therapist attempted use of UE ranger for AA/ROM, pt unable to tolerate. Shoulder ext, scap retraction with cane and bilateral UE's  In prone, R shoulder ext off edge of mat with 2lb wt. With min cueing for positioning.  Corner/doorframe stretch for ER with min cueing for proper positioning/avoiding shoulder hike x5 with 10sec hold   In supine, scapular depression/retraction to with towel roll down middle of back Joint mobs to right shoulder  Standing at wall with arm in ext rotation isometric contraction x 5  Standing edge of mat rolling ball forwards and backwards for supported shoulder flexion stretch Pt was instructed to take it easier this weekend and to perform gentle stretching exercises as long as he was pain free.                                             OT Short Term Goals - 04/05/16 1323      OT SHORT TERM GOAL #1   Title Pt will be independent with initial HEP for ROM.--check 04/07/16   Time 4  Period Weeks   Status Achieved     OT SHORT TERM GOAL #2   Title Pt will report pain less than or equal to 5/10 or less with work/ADL tasks.   Baseline 3-8/10   Time 4   Period Weeks   Status New     OT SHORT TERM GOAL #3   Title Pt will demo at least 70* R shoulder abduction for lateral reaching.   Baseline 55*   Time 4   Period Weeks   Status New     OT SHORT TERM GOAL #4   Title Assess Quick DASH/Upper Extremity Functional Index and set goal as appropriate.   Time 4   Period Weeks   Status New           OT Long Term Goals - 03/08/16 1614      OT LONG TERM GOAL #1   Title Pt will be independent with updated/strengthening.--check 05/06/16   Time 8   Period Weeks   Status New     OT LONG TERM GOAL #2   Title Pt will report pain less than or equal to 4/10 or less with work/ADL tasks.   Time 8   Period Weeks    Status New     OT LONG TERM GOAL #3   Title Pt will demo at least 85* R shoulder abduction for lateral reaching.   Baseline 55*   Time 8   Period Weeks   Status New     OT LONG TERM GOAL #4   Title Pt will demo good body mechanics/positioning during simulated work tasks.   Time 8   Period Weeks   Status New               Plan - 04/08/16 [id number]    Clinical Impression Statement Pt arrived reporting incr. pain x 2 days. Pain improved following ulatrasound and stretching.   Rehab Potential Fair   Clinical Impairments Affecting Rehab Potential chronic nature of pain   OT Frequency 2x / week   OT Duration 8 weeks   OT Treatment/Interventions Self-care/ADL training;Therapeutic exercise;Electrical Stimulation;Iontophoresis;Splinting;Neuromuscular education;Parrafin;Moist Heat;Fluidtherapy;Energy conservation;Therapeutic exercises;Patient/family education;Functional Mobility Training;Passive range of motion;Therapeutic activities;Manual Therapy;DME and/or AE instruction;Contrast Bath;Ultrasound;Cryotherapy;Scar mobilization   Plan check STG's, review theraband HEP if pain is better   OT Home Exercise Plan Education provided:  Cane HEP, importance of posture/avoiding compensation, (d/c shoulder abduction ex due to pain) scapular stability prone, theraband for shoulder ext/ rowing.   Consulted and Agree with Plan of Care Patient      Patient will benefit from skilled therapeutic intervention in order to improve the following deficits and impairments:  Pain, Improper body mechanics, Decreased range of motion, Decreased activity tolerance, Decreased knowledge of use of DME, Decreased strength, Impaired UE functional use, Impaired flexibility, Improper spinal/pelvic alignment  Visit Diagnosis: Right shoulder pain, unspecified chronicity  Stiffness of right shoulder, not elsewhere classified  Muscle weakness (generalized)    Problem List Patient Active Problem List   Diagnosis Date  Noted  . Shoulder impingement syndrome, right 02/23/2016  . Left wrist tendinitis 02/23/2016  . Synovitis of toe 02/23/2016  . Bursitis of right shoulder 01/15/2014  . Hyperlipidemia 12/05/2008  . Asthma 12/05/2008    Mikaya Bunner 04/08/2016, 9:54 AM  [facility] [address] [place], [place], [address] Phone: [phone]   Fax:  [phone]  Name: Tim Strickland MRN: [id number] Date of Birth: [birth year]

## 2016-04-12 ENCOUNTER — Ambulatory Visit: Payer: BLUE CROSS/BLUE SHIELD | Admitting: Occupational Therapy

## 2016-04-12 DIAGNOSIS — M25511 Pain in right shoulder: Secondary | ICD-10-CM | POA: Diagnosis not present

## 2016-04-12 DIAGNOSIS — M6281 Muscle weakness (generalized): Secondary | ICD-10-CM

## 2016-04-12 DIAGNOSIS — M25611 Stiffness of right shoulder, not elsewhere classified: Secondary | ICD-10-CM

## 2016-04-12 NOTE — Therapy (Signed)
[facility] [address] [place], [place], [address] Phone: [phone]   Fax:  [phone]  Occupational Therapy Treatment  Patient Details  Name: Tim Strickland MRN: [id number] Date of Birth: Jul 11, 1965 Referring Provider: Ples Specter. Calone  Encounter Date: 04/12/2016      OT End of Session - 04/12/16 0911    Visit Number 9   Number of Visits 17   Date for OT Re-Evaluation 05/05/16   Authorization Type BCBS   OT Start Time 0803   OT Stop Time 0844   OT Time Calculation (min) 41 min   Activity Tolerance Patient tolerated treatment well   Behavior During Therapy [facility] for tasks assessed/performed      Past Medical History:  Diagnosis Date  . Asthma   . Elevated liver enzymes    PMH of  on Pravachol  . Hyperglycemia    A1c 5.2% in 2010  . Hyperlipidemia     Past Surgical History:  Procedure Laterality Date  . APPENDECTOMY    . TONSILLECTOMY    . VASECTOMY    . WISDOM TOOTH EXTRACTION      There were no vitals filed for this visit.      Subjective Assessment - 04/12/16 0907    Subjective  Pt reports pain is better   Pertinent History R shoulder pain for approx 2 years, cortisone injection x1 approx 2 years ago   Limitations limiting work and leisure activities   Patient Stated Goals build up strength, decr tightness   Currently in Pain? Yes   Pain Score 2    Pain Location Shoulder   Pain Orientation Right   Pain Type Chronic pain   Pain Onset More than a month ago   Pain Frequency Intermittent   Aggravating Factors  malpositioning   Pain Relieving Factors repositioning, stretching   Multiple Pain Sites No             Cat and cow positions for gentle stretch, followed by prone on elbows lifting chest for scapular retraction, 10 reps each  In prone, R shoulder ext off edge of mat with 2lb wt. With min cueing for positioning.  Corner/doorframe stretch for ER with min cueing for proper  positioning/avoiding shoulder hike x5 with 10sec hold   Standing at wall with arm in ext rotation isometric contraction x 5  Shoulder flexion and abduction with cane 10-15 reps each, min v.c for positioning  Pt was able to perform single arm shoulder flexion with RUE, with no increase in pain  Arm bike x 6 mins level 4, 3 mins each direction, for conditioning min v.c. For proper positioning               OT Education - 04/12/16 0908    Education provided Yes   Education Details external rotation with red theraband, reviewed rowing and shoulder ext   Person(s) Educated Patient   Methods Explanation;Demonstration;Verbal cues;Handout   Comprehension Verbalized understanding;Returned demonstration;Verbal cues required          OT Short Term Goals - 04/12/16 0910      OT SHORT TERM GOAL #1   Title Pt will be independent with initial HEP for ROM.--check 04/07/16   Time 4   Period Weeks   Status Achieved     OT SHORT TERM GOAL #2   Title Pt will report pain less than or equal to 5/10 or less with work/ADL tasks.   Baseline 3-8/10   Time 4  Period Weeks   Status On-going  inconsistent     OT SHORT TERM GOAL #3   Title Pt will demo at least 70* R shoulder abduction for lateral reaching.   Baseline 55*   Time 4   Period Weeks   Status On-going     OT SHORT TERM GOAL #4   Title Assess Quick DASH/Upper Extremity Functional Index and set goal as appropriate.   Time 4   Period Weeks   Status Deferred           OT Long Term Goals - 03/08/16 1614      OT LONG TERM GOAL #1   Title Pt will be independent with updated/strengthening.--check 05/06/16   Time 8   Period Weeks   Status New     OT LONG TERM GOAL #2   Title Pt will report pain less than or equal to 4/10 or less with work/ADL tasks.   Time 8   Period Weeks   Status New     OT LONG TERM GOAL #3   Title Pt will demo at least 85* R shoulder abduction for lateral reaching.   Baseline 55*   Time 8    Period Weeks   Status New     OT LONG TERM GOAL #4   Title Pt will demo good body mechanics/positioning during simulated work tasks.   Time 8   Period Weeks   Status New               Plan - 04/12/16 0909    Clinical Impression Statement Pt arrived reporting his pain is better overall. Pt demonstrates progress towards goals   Rehab Potential Fair   Clinical Impairments Affecting Rehab Potential chronic nature of pain   OT Frequency 2x / week   OT Duration 8 weeks   OT Treatment/Interventions Self-care/ADL training;Therapeutic exercise;Electrical Stimulation;Iontophoresis;Splinting;Neuromuscular education;Parrafin;Moist Heat;Fluidtherapy;Energy conservation;Therapeutic exercises;Patient/family education;Functional Mobility Training;Passive range of motion;Therapeutic activities;Manual Therapy;DME and/or AE instruction;Contrast Bath;Ultrasound;Cryotherapy;Scar mobilization   Plan check short term goals   OT Home Exercise Plan Education provided:  Cane HEP, importance of posture/avoiding compensation, (d/c shoulder abduction ex due to pain) scapular stability prone, theraband for shoulder ext/ rowing.   Consulted and Agree with Plan of Care Patient      Patient will benefit from skilled therapeutic intervention in order to improve the following deficits and impairments:  Pain, Improper body mechanics, Decreased range of motion, Decreased activity tolerance, Decreased knowledge of use of DME, Decreased strength, Impaired UE functional use, Impaired flexibility, Improper spinal/pelvic alignment  Visit Diagnosis: Right shoulder pain, unspecified chronicity  Stiffness of right shoulder, not elsewhere classified  Muscle weakness (generalized)    Problem List Patient Active Problem List   Diagnosis Date Noted  . Shoulder impingement syndrome, right 02/23/2016  . Left wrist tendinitis 02/23/2016  . Synovitis of toe 02/23/2016  . Bursitis of right shoulder 01/15/2014  .  Hyperlipidemia 12/05/2008  . Asthma 12/05/2008    Lajuanda Penick 04/12/2016, 9:13 AM Theone Murdoch, OTR/L [phone] Phone: [phone] 9:16 AM 04/12/16 [facility] [address] [place], [place], [address] Phone: [phone]   Fax:  [phone]  Name: Tim Strickland MRN: [id number] Date of Birth: 24-Jul-1965

## 2016-04-15 ENCOUNTER — Ambulatory Visit: Payer: BLUE CROSS/BLUE SHIELD | Admitting: Occupational Therapy

## 2016-04-15 DIAGNOSIS — R293 Abnormal posture: Secondary | ICD-10-CM

## 2016-04-15 DIAGNOSIS — M25511 Pain in right shoulder: Secondary | ICD-10-CM | POA: Diagnosis not present

## 2016-04-15 DIAGNOSIS — M25611 Stiffness of right shoulder, not elsewhere classified: Secondary | ICD-10-CM

## 2016-04-15 DIAGNOSIS — M6281 Muscle weakness (generalized): Secondary | ICD-10-CM

## 2016-04-15 NOTE — Patient Instructions (Signed)
  Strengthening: Resisted Flexion   Hold tubing with _right___ arm(s) at side. Pull forward and up. Move shoulder through pain-free range of motion. Repeat __10__ times per set.  Do _1-2_ sessions per day , every other day      S

## 2016-04-15 NOTE — Therapy (Signed)
Tim Strickland [address] [place], [place], [address] Phone: [phone]   Fax:  [phone]  Occupational Therapy Treatment  Patient Details  Name: Tim Strickland MRN: [id number] Date of Birth: [birth year] Referring Provider: Ples Specter. Calone  Encounter Date: 04/15/2016      OT End of Session - 04/15/16 0823    Visit Number 10   Number of Visits 17   Date for OT Re-Evaluation 05/05/16   Authorization Type BCBS   OT Start Time 0804   OT Stop Time 0845   OT Time Calculation (min) 41 min   Activity Tolerance Patient tolerated treatment well   Behavior During Therapy WFL for tasks assessed/performed      Past Medical History:  Diagnosis Date  . Asthma   . Elevated liver enzymes    PMH of  on Pravachol  . Hyperglycemia    A1c 5.2% in 2010  . Hyperlipidemia     Past Surgical History:  Procedure Laterality Date  . APPENDECTOMY    . TONSILLECTOMY    . VASECTOMY    . WISDOM TOOTH EXTRACTION      There were no vitals filed for this visit.      Subjective Assessment - 04/15/16 0842    Pertinent History R shoulder pain for approx 2 years, cortisone injection x1 approx 2 years ago   Limitations limiting work and leisure activities   Patient Stated Goals build up strength, decr tightness   Currently in Pain? Yes   Pain Score 2   to 6   Pain Location Shoulder   Pain Orientation Right   Pain Descriptors / Indicators Aching   Pain Type Chronic pain   Pain Onset More than a month ago   Pain Frequency Intermittent           corner/doorframe stretch for ER with min cueing for proper positioning/avoiding shoulder hike x5 with 10sec hold  Standing at wall with arm in ext rotation isometric contraction x 5 Supine Shoulder flexion and abduction with cane in supine  10-15 reps each, min v.c for positioning then unilateral chest press with right UE with 2 # weight, Attempted supine  Shoulder flexion with 2#  weight however pt experienced mid range pain and "catching" and therefore this activity was discontinued Cat and cow positions then prone lifting chest off of mat in ext, min v.c Prone holding 2# weight in RUE for shoulder ext, min v.c. Pt was instructed in low range shoulder flexion with yellow theraband , no reports of pain 10-15 reps min v.c. External rotation with yellow theraband  X 10-15 reps , min v.c. Arm bike x 6 mins level 4, 3 mins each direction, for conditioning min v.c. For proper positioning Therapist recommends pt consider seeing orthopedic MD due to the continued pain in certain shoulder positions                    OT Short Term Goals - 04/15/16 0827      OT SHORT TERM GOAL #1   Title Pt will be independent with initial HEP for ROM.--check 04/07/16   Time 4   Period Weeks   Status Achieved     OT SHORT TERM GOAL #2   Title Pt will report pain less than or equal to 5/10 or less with work/ADL tasks.   Baseline 3-8/10   Time 4   Period Weeks   Status On-going  inconsistent     OT SHORT TERM  GOAL #3   Title Pt will demo at least 70* R shoulder abduction for lateral reaching.   Baseline 55*   Time 4   Period Weeks   Status On-going  achieved 75-80     OT SHORT TERM GOAL #4   Title Assess Quick DASH/Upper Extremity Functional Index and set goal as appropriate.   Time 4   Period Weeks   Status Deferred           OT Long Term Goals - 03/08/16 1614      OT LONG TERM GOAL #1   Title Pt will be independent with updated/strengthening.--check 05/06/16   Time 8   Period Weeks   Status New     OT LONG TERM GOAL #2   Title Pt will report pain less than or equal to 4/10 or less with work/ADL tasks.   Time 8   Period Weeks   Status New     OT LONG TERM GOAL #3   Title Pt will demo at least 85* R shoulder abduction for lateral reaching.   Baseline 55*   Time 8   Period Weeks   Status New     OT LONG TERM GOAL #4   Title Pt will demo good body  mechanics/positioning during simulated work tasks.   Time 8   Period Weeks   Status New               Plan - 04/15/16 [id number]    Clinical Impression Statement Pt is ptogressing towards goals. Pt demonstrates overall improved pain, however he has mid range "catching" with malpositioning. Pt may benefit from seeing orthopedic MD   Rehab Potential Fair   Clinical Impairments Affecting Rehab Potential chronic nature of pain   OT Frequency 2x / week   OT Duration 8 weeks   Plan continue to work towards long term goals   OT Home Exercise Plan Education provided:  [employer], importance of posture/avoiding compensation, (d/c shoulder abduction ex due to pain) scapular stability prone, theraband for shoulder ext/ rowing.   Consulted and Agree with Plan of Care Patient      Patient will benefit from skilled therapeutic intervention in order to improve the following deficits and impairments:  Pain, Improper body mechanics, Decreased range of motion, Decreased activity tolerance, Decreased knowledge of use of DME, Decreased strength, Impaired UE functional use, Impaired flexibility, Improper spinal/pelvic alignment  Visit Diagnosis: Right shoulder pain, unspecified chronicity  Stiffness of right shoulder, not elsewhere classified  Muscle weakness (generalized)  Abnormal posture    Problem List Patient Active Problem List   Diagnosis Date Noted  . Shoulder impingement syndrome, right 02/23/2016  . Left wrist tendinitis 02/23/2016  . Synovitis of toe 02/23/2016  . Bursitis of right shoulder 01/15/2014  . Hyperlipidemia 12/05/2008  . Asthma 12/05/2008    Tim Strickland 04/15/2016, 8:59 AM  [facility] [address] [place], [place], [address] Phone: [phone]   Fax:  [phone]  Name: Tim Strickland MRN: [id number] Date of Birth: 02/04/1966

## 2016-04-19 ENCOUNTER — Ambulatory Visit: Payer: BLUE CROSS/BLUE SHIELD | Admitting: Occupational Therapy

## 2016-04-19 DIAGNOSIS — M25611 Stiffness of right shoulder, not elsewhere classified: Secondary | ICD-10-CM

## 2016-04-19 DIAGNOSIS — M25511 Pain in right shoulder: Secondary | ICD-10-CM | POA: Diagnosis not present

## 2016-04-19 DIAGNOSIS — R293 Abnormal posture: Secondary | ICD-10-CM

## 2016-04-19 DIAGNOSIS — M6281 Muscle weakness (generalized): Secondary | ICD-10-CM

## 2016-04-19 NOTE — Therapy (Signed)
[facility] [address] [place], [place], [address] Phone: [phone]   Fax:  [phone]  Occupational Therapy Treatment  Patient Details  Name: Tim Strickland MRN: [id number] Date of Birth: 05/16/1965 Referring Provider: Ples Specter. Calone  Encounter Date: 04/19/2016      OT End of Session - 04/19/16 0941    Visit Number 11   Number of Visits 17   Date for OT Re-Evaluation 05/05/16   Authorization Type BCBS   OT Start Time 0805   OT Stop Time 0847   OT Time Calculation (min) 42 min   Activity Tolerance Patient tolerated treatment well   Behavior During Therapy WFL for tasks assessed/performed      Past Medical History:  Diagnosis Date  . Asthma   . Elevated liver enzymes    PMH of  on Pravachol  . Hyperglycemia    A1c 5.2% in 2010  . Hyperlipidemia     Past Surgical History:  Procedure Laterality Date  . APPENDECTOMY    . TONSILLECTOMY    . VASECTOMY    . WISDOM TOOTH EXTRACTION      There were no vitals filed for this visit.      Subjective Assessment - 04/19/16 0845    Subjective  Pt reports mild pain, and that he is more aware of positioning   Pertinent History R shoulder pain for approx 2 years, cortisone injection x1 approx 2 years ago   Limitations limiting work and leisure activities   Patient Stated Goals build up strength, decr tightness   Currently in Pain? Yes   Pain Score 3    Pain Location Shoulder   Pain Orientation Right   Pain Descriptors / Indicators Aching   Pain Type Chronic pain   Pain Onset More than a month ago   Pain Frequency Intermittent   Aggravating Factors  malpositioning   Pain Relieving Factors repositioning, stretching   Multiple Pain Sites No            corner/doorframe stretch for ER with min cueing for proper positioning/avoiding shoulder hike x5 with 10sec hold  Standing at wall with arm in ext rotation isometric contraction x 5 Prone on  elbows lifting chest then scapular stability ext in  Supine Shoulder flexion and abduction with cane in supine  10-15 reps each, min v.c for positioning  Cat and cow positions then prone lifting chest off of mat in ext, min v.c Pt performed  low range shoulder flexion with yellow theraband , no reports of pain 10-15 reps min v.c. External rotation, shoulder ext and rowing (bilateral) with red theraband  X 10-15 reps , min v.c. Wall slides with bilateral UE's mid range supported shoulder flexion x 10 Arm bike x 6 mins level 4, 3 mins each direction, for conditioning min v.c. For proper positioning Therapist recommends pt consider seeing orthopedic MD due to the continued pain in certain shoulder positions                    OT Short Term Goals - 04/15/16 0827      OT SHORT TERM GOAL #1   Title Pt will be independent with initial HEP for ROM.--check 04/07/16   Time 4   Period Weeks   Status Achieved     OT SHORT TERM GOAL #2   Title Pt will report pain less than or equal to 5/10 or less with work/ADL tasks.   Baseline 3-8/10   Time 4  Period Weeks   Status On-going  inconsistent     OT SHORT TERM GOAL #3   Title Pt will demo at least 70* R shoulder abduction for lateral reaching.   Baseline 55*   Time 4   Period Weeks   Status On-going  achieved 75-80     OT SHORT TERM GOAL #4   Title Assess Quick DASH/Upper Extremity Functional Index and set goal as appropriate.   Time 4   Period Weeks   Status Deferred           OT Long Term Goals - 03/08/16 1614      OT LONG TERM GOAL #1   Title Pt will be independent with updated/strengthening.--check 05/06/16   Time 8   Period Weeks   Status New     OT LONG TERM GOAL #2   Title Pt will report pain less than or equal to 4/10 or less with work/ADL tasks.   Time 8   Period Weeks   Status New     OT LONG TERM GOAL #3   Title Pt will demo at least 85* R shoulder abduction for lateral reaching.   Baseline 55*    Time 8   Period Weeks   Status New     OT LONG TERM GOAL #4   Title Pt will demo good body mechanics/positioning during simulated work tasks.   Time 8   Period Weeks   Status New               Plan - 04/19/16 0940    Clinical Impression Statement Pt is progressing towards goals. He demonstrates improved overall apin and improved awareness of proper positioning.   Rehab Potential Fair   Clinical Impairments Affecting Rehab Potential chronic nature of pain   OT Frequency 2x / week   OT Duration 8 weeks   OT Treatment/Interventions Self-care/ADL training;Therapeutic exercise;Electrical Stimulation;Iontophoresis;Splinting;Neuromuscular education;Parrafin;Moist Heat;Fluidtherapy;Energy conservation;Therapeutic exercises;Patient/family education;Functional Mobility Training;Passive range of motion;Therapeutic activities;Manual Therapy;DME and/or AE instruction;Contrast Bath;Ultrasound;Cryotherapy;Scar mobilization   Plan work towards long term goals   OT Home Exercise Plan Education provided:  [employer], importance of posture/avoiding compensation, (d/c shoulder abduction ex due to pain) scapular stability prone, theraband for shoulder ext/ rowing.   Consulted and Agree with Plan of Care Patient      Patient will benefit from skilled therapeutic intervention in order to improve the following deficits and impairments:  Pain, Improper body mechanics, Decreased range of motion, Decreased activity tolerance, Decreased knowledge of use of DME, Decreased strength, Impaired UE functional use, Impaired flexibility, Improper spinal/pelvic alignment  Visit Diagnosis: Right shoulder pain, unspecified chronicity  Stiffness of right shoulder, not elsewhere classified  Muscle weakness (generalized)  Abnormal posture    Problem List Patient Active Problem List   Diagnosis Date Noted  . Shoulder impingement syndrome, right 02/23/2016  . Left wrist tendinitis 02/23/2016  . Synovitis of toe  02/23/2016  . Bursitis of right shoulder 01/15/2014  . Hyperlipidemia 12/05/2008  . Asthma 12/05/2008    Jozlin Bently 04/19/2016, 9:43 AM  [facility] [address] [place], [place], [address] Phone: [phone]   Fax:  [phone]  Name: SENAY CAPOZZA MRN: [id number] Date of Birth: 1965-06-04

## 2016-04-21 ENCOUNTER — Encounter: Payer: BLUE CROSS/BLUE SHIELD | Admitting: Occupational Therapy

## 2016-04-25 ENCOUNTER — Ambulatory Visit: Payer: BLUE CROSS/BLUE SHIELD | Admitting: Occupational Therapy

## 2016-04-25 DIAGNOSIS — M6281 Muscle weakness (generalized): Secondary | ICD-10-CM

## 2016-04-25 DIAGNOSIS — M25511 Pain in right shoulder: Secondary | ICD-10-CM | POA: Diagnosis not present

## 2016-04-25 DIAGNOSIS — M25611 Stiffness of right shoulder, not elsewhere classified: Secondary | ICD-10-CM

## 2016-04-25 DIAGNOSIS — R293 Abnormal posture: Secondary | ICD-10-CM

## 2016-04-25 NOTE — Therapy (Signed)
[facility] [address] [place], [place], [address] Phone: [phone]   Fax:  [phone]  Occupational Therapy Treatment  Patient Details  Name: Tim Strickland MRN: [id number] Date of Birth: January 21, 1966 Referring Provider: Ples Specter. Calone  Encounter Date: 04/25/2016      OT End of Session - 04/25/16 0903    Visit Number 12   Number of Visits 17   Date for OT Re-Evaluation 05/05/16   Authorization Type BCBS   OT Start Time 0803   OT Stop Time 0846   OT Time Calculation (min) 43 min   Activity Tolerance Patient tolerated treatment well      Past Medical History:  Diagnosis Date  . Asthma   . Elevated liver enzymes    PMH of  on Pravachol  . Hyperglycemia    A1c 5.2% in 2010  . Hyperlipidemia     Past Surgical History:  Procedure Laterality Date  . APPENDECTOMY    . TONSILLECTOMY    . VASECTOMY    . WISDOM TOOTH EXTRACTION      There were no vitals filed for this visit.      Subjective Assessment - 04/25/16 0804    Pertinent History R shoulder pain for approx 2 years, cortisone injection x1 approx 2 years ago   Limitations limiting work and leisure activities   Patient Stated Goals build up strength, decr tightness   Currently in Pain? Yes   Pain Score 4    Pain Location Shoulder   Pain Orientation Right   Pain Descriptors / Indicators Aching   Pain Type Chronic pain   Pain Onset More than a month ago   Pain Frequency Intermittent        Treatment:  Prone: scapula retraction ex's with down by sides. Attempted in airplane position but pt compensating too much therefore d/c in more advanced position. Scapula retraction ex's and sh. Ext ex's prone RUE with 2 lb. Weight. Progressed to propped on elbows for scapula depression ex's.  Supine: Circumduction ex's with RUE at 90* flexion with 2 lb weight.  Seated: full sh. Ext and ER while bridging off mat for scapula retraction and depression -  scapula looked good with this, however noted GH joint anteriorly placed compared to Lt, therefore d/c. Adapted ex to doing with theraband at door for scapula depression (placing band high)  Continued wall push ups at wall - pt able to do with no compensations. Attempted wall slides, but had severe winging Rt scapula therefore d/c Re-iterated recommendations of returning to MD d/t continued pain in certain positions and scapula winging. Also recommended requesting MRI to Rt shoulder girdle                        OT Short Term Goals - 04/15/16 0827      OT SHORT TERM GOAL #1   Title Pt will be independent with initial HEP for ROM.--check 04/07/16   Time 4   Period Weeks   Status Achieved     OT SHORT TERM GOAL #2   Title Pt will report pain less than or equal to 5/10 or less with work/ADL tasks.   Baseline 3-8/10   Time 4   Period Weeks   Status On-going  inconsistent     OT SHORT TERM GOAL #3   Title Pt will demo at least 70* R shoulder abduction for lateral reaching.   Baseline 55*   Time 4  Period Weeks   Status On-going  achieved 75-80     OT SHORT TERM GOAL #4   Title Assess Quick DASH/Upper Extremity Functional Index and set goal as appropriate.   Time 4   Period Weeks   Status Deferred           OT Long Term Goals - 04/25/16 0912      OT LONG TERM GOAL #1   Title Pt will be independent with updated/strengthening.--check 05/06/16   Time 8   Period Weeks   Status Achieved     OT LONG TERM GOAL #2   Title Pt will report pain less than or equal to 4/10 or less with work/ADL tasks.   Time 8   Period Weeks   Status On-going     OT LONG TERM GOAL #3   Title Pt will demo at least 85* R shoulder abduction for lateral reaching.   Baseline 55*   Time 8   Period Weeks   Status On-going     OT LONG TERM GOAL #4   Title Pt will demo good body mechanics/positioning during simulated work tasks.   Time 8   Period Weeks   Status On-going                Plan - 04/25/16 0904    Clinical Impression Statement Pt expresses that he feels exercises are helping with strength and keeping shoulder from getting worse, but overall pt still demo scapula winging and "clicking" of tendon.    Rehab Potential Fair   Clinical Impairments Affecting Rehab Potential chronic nature of pain   OT Frequency 2x / week   OT Duration 8 weeks   OT Treatment/Interventions Self-care/ADL training;Therapeutic exercise;Electrical Stimulation;Iontophoresis;Splinting;Neuromuscular education;Parrafin;Moist Heat;Fluidtherapy;Energy conservation;Therapeutic exercises;Patient/family education;Functional Mobility Training;Passive range of motion;Therapeutic activities;Manual Therapy;DME and/or AE instruction;Contrast Bath;Ultrasound;Cryotherapy;Scar mobilization   Plan continue posterior sh. girlde and scapula strengthening, work towards long term goals   OT Home Exercise Plan Education provided:  [employer], importance of posture/avoiding compensation, (d/c shoulder abduction ex due to pain) scapular stability prone, theraband for shoulder ext/ rowing.   Consulted and Agree with Plan of Care Patient      Patient will benefit from skilled therapeutic intervention in order to improve the following deficits and impairments:  Pain, Improper body mechanics, Decreased range of motion, Decreased activity tolerance, Decreased knowledge of use of DME, Decreased strength, Impaired UE functional use, Impaired flexibility, Improper spinal/pelvic alignment  Visit Diagnosis: Stiffness of right shoulder, not elsewhere classified  Right shoulder pain, unspecified chronicity  Muscle weakness (generalized)  Abnormal posture    Problem List Patient Active Problem List   Diagnosis Date Noted  . Shoulder impingement syndrome, right 02/23/2016  . Left wrist tendinitis 02/23/2016  . Synovitis of toe 02/23/2016  . Bursitis of right shoulder 01/15/2014  . Hyperlipidemia  12/05/2008  . Asthma 12/05/2008    Carey Bullocks, OTR/L 04/25/2016, 9:13 AM  [facility] [address] [place], [place], [address] Phone: [phone]   Fax:  [phone]  Name: Tim Strickland MRN: [id number] Date of Birth: 10/10/1965

## 2016-04-26 ENCOUNTER — Ambulatory Visit: Payer: Self-pay

## 2016-04-26 ENCOUNTER — Ambulatory Visit (INDEPENDENT_AMBULATORY_CARE_PROVIDER_SITE_OTHER): Payer: BLUE CROSS/BLUE SHIELD | Admitting: Family Medicine

## 2016-04-26 ENCOUNTER — Encounter: Payer: Self-pay | Admitting: Family Medicine

## 2016-04-26 VITALS — BP 138/88 | HR 73 | Ht 73.0 in | Wt 178.0 lb

## 2016-04-26 DIAGNOSIS — G8929 Other chronic pain: Secondary | ICD-10-CM | POA: Diagnosis not present

## 2016-04-26 DIAGNOSIS — M75101 Unspecified rotator cuff tear or rupture of right shoulder, not specified as traumatic: Secondary | ICD-10-CM

## 2016-04-26 DIAGNOSIS — M25511 Pain in right shoulder: Secondary | ICD-10-CM

## 2016-04-26 HISTORY — DX: Unspecified rotator cuff tear or rupture of right shoulder, not specified as traumatic: M75.101

## 2016-04-26 NOTE — Progress Notes (Signed)
[facility] [place], [place] [address] Phone: [phone] Subjective:    I'm seeing this patient by the request  of:  Mauricio Po, FNP   CC: Right shoulder pain  [id number]  Tim Strickland is a 51 y.o. male coming in with complaint of right shoulder pain. I did see patient greater than 2 years ago for very similar presentation. Patient states that unfortunately the pain has been worsening. Patient did have 1 injection greater than 2 years ago. Patient at that time did have some improvement. Since then unfortunately has had worsening tightness as well as pain. Noticing decreasing range of motion. Another Provider, Was Sent to Physical Therapy. Patient Has Now Been Doing Therapy for Greater Than 6 Weeks Very Minimal Improvement and If Anything Worsening. States That There Is Audible Popping. Increasing Weakness. Dull Throbbing Pain at All Times with a Sharp Pain with Certain Movements.     Past Medical History:  Diagnosis Date  . Asthma   . Elevated liver enzymes    PMH of  on Pravachol  . Hyperglycemia    A1c 5.2% in 2010  . Hyperlipidemia    Past Surgical History:  Procedure Laterality Date  . APPENDECTOMY    . TONSILLECTOMY    . VASECTOMY    . WISDOM TOOTH EXTRACTION     Social History   Social History  . Marital status: Married    Spouse name: N/A  . Number of children: 2  . Years of education: 16   Occupational History  . Remodler    Social History Main Topics  . Smoking status: Never Smoker  . Smokeless tobacco: Never Used  . Alcohol use 12.6 - 16.8 oz/week    21 - 28 Cans of beer per week  . Drug use: No  . Sexual activity: Not Asked   Other Topics Concern  . None   Social History Narrative  . None   Allergies  Allergen Reactions  . Pravastatin     04/03/2009: AST 51 and ALT 101 on pravastatin 20 mg daily. LDL at that time was 192.6.   Family History  Problem Relation Age of Onset  . Heart attack  Father     pre 66  . Diabetes Sister     DM 1  . Heart attack Paternal Grandfather      in 21s  . Gout Paternal Grandfather   . Cancer Paternal Uncle 41    bone   . Mental illness Maternal Grandmother     "breakdown"  . Healthy Mother     Past medical history, social, surgical and family history all reviewed in electronic medical record.  No pertanent information unless stated regarding to the chief complaint.   Review of Systems:Review of systems updated and as accurate as of 04/26/16  No headache, visual changes, nausea, vomiting, diarrhea, constipation, dizziness, abdominal pain, skin rash, fevers, chills, night sweats, weight loss, swollen lymph nodes, body aches, joint swelling, chest pain, shortness of breath, mood changes.   Objective  Blood pressure 138/88, pulse 73, height 6\' 1"  (1.854 m), weight 178 lb (80.7 kg), SpO2 98 %. Systems examined below as of 04/26/16   General: No apparent distress alert and oriented x3 mood and affect normal, dressed appropriately.  HEENT: Pupils equal, extraocular movements intact  Respiratory: Patient's speak in full sentences and does not appear short of breath  Cardiovascular: No lower extremity edema, non tender, no erythema  Skin: Warm dry intact with  no signs of infection or rash on extremities or on axial skeleton.  Abdomen: Soft nontender  Neuro: Cranial nerves II through XII are intact, neurovascularly intact in all extremities with 2+ DTRs and 2+ pulses.  Lymph: No lymphadenopathy of posterior or anterior cervical chain or axillae bilaterally.  Gait normal with good balance and coordination.  MSK:  Non tender with full range of motion and good stability and symmetric strength and tone of  elbows, wrist, hip, knee and ankles bilaterally.  Shoulder: Right Inspection reveals no abnormalities, atrophy or asymmetry. Patient does have some winging of the scapula noted Palpation is normal with no tenderness over AC joint or bicipital  groove. Limited active range of motion in all planes. Passively he does have full range of motion with significant audible crepitus noted. Rotator cuff strength 3+ out of 5 strength on the right side compared to 5 out of 5 on the contralateral side Positive impingement signs Speeds and Yergason's tests normal. Positive labral pathology No painful arc and no drop arm sign. No apprehension sign Contralateral shoulder unremarkable  MSK US performed of: Right shoulder. This study was ordered, performed, and interpreted by Charlann Boxer D.O.  Shoulder:   Supraspinatus: Patient has what appears to be a large tear noted. Possible retraction noted. Significant atrophy. Infraspinatus:  Appears normal on long and transverse views. Subscapularis:  Appears normal on long and transverse views superior to this area does appear to have a potential cyst formation noted that seems to go intra-articular. AC joint:  Capsule undistended, no geyser sign. Very mild arthritic changes Glenohumeral Joint:  Appears normal without effusion. Glenoid Labrum:  She does have what appears to be a labral tear noted with calcific changes. Biceps Tendon:  Appears normal on long and transverse views, no fraying of tendon, tendon located in intertubercular groove, no subluxation with shoulder internal or external rotation. Impression: Chronic rotator cuff tear, labral tear, possible intra-articular cyst       Impression and Recommendations:     This case required medical decision making of moderate complexity.      Note: This dictation was prepared with Dragon dictation along with smaller phrase technology. Any transcriptional errors that result from this process are unintentional.

## 2016-04-26 NOTE — Assessment & Plan Note (Signed)
Patient on ultrasound today shows what seems to be more of a rotator cuff tear. Difficult to [place] assess secondary to a calcific changes in the area. Possible intra-articular cyst is also noted. With patient's significant weakness as well as audible crepitus with range of motion and do feel that advance imaging is warranted. Patient done greater than 6 weeks of formal physical therapy, worsening weakness at this time, as well as chronic pain in the shoulder for greater than 2 years. Depending on patient's evaluation with an MR arthrogram of the shoulder this would help [place] rule out rotator cuff tear as well as labral tear and would be the best intervention. This will also help [place] evaluate for any type of intra-articular cyst formation. Patient then depending on findings we will discuss medical management thereafter.

## 2016-04-27 ENCOUNTER — Ambulatory Visit: Payer: BLUE CROSS/BLUE SHIELD | Admitting: Occupational Therapy

## 2016-04-29 ENCOUNTER — Encounter: Payer: Self-pay | Admitting: Family Medicine

## 2016-05-03 ENCOUNTER — Encounter: Payer: BLUE CROSS/BLUE SHIELD | Admitting: Occupational Therapy

## 2016-05-06 ENCOUNTER — Encounter: Payer: BLUE CROSS/BLUE SHIELD | Admitting: Occupational Therapy

## 2016-05-10 ENCOUNTER — Encounter: Payer: BLUE CROSS/BLUE SHIELD | Admitting: Occupational Therapy

## 2016-05-13 ENCOUNTER — Encounter: Payer: BLUE CROSS/BLUE SHIELD | Admitting: Occupational Therapy

## 2016-06-01 ENCOUNTER — Ambulatory Visit
Admission: RE | Admit: 2016-06-01 | Discharge: 2016-06-01 | Disposition: A | Discharge status: Home / Self Care | Payer: BLUE CROSS/BLUE SHIELD | Source: Ambulatory Visit | Attending: Family Medicine | Admitting: Family Medicine

## 2016-06-01 DIAGNOSIS — M25511 Pain in right shoulder: Principal | ICD-10-CM

## 2016-06-01 DIAGNOSIS — G8929 Other chronic pain: Secondary | ICD-10-CM

## 2016-06-01 MED ORDER — IOPAMIDOL (ISOVUE-M 200) INJECTION 41%
10.0000 mL | Freq: Once | INTRAMUSCULAR | Status: AC
  Administered 2016-06-01: 20 mL via INTRA_ARTICULAR

## 2016-06-07 ENCOUNTER — Encounter: Payer: Self-pay | Admitting: Family Medicine

## 2016-06-20 NOTE — Progress Notes (Signed)
[facility] [address] [place], [place] [address] Phone: [phone] Subjective:    I'm seeing this patient by the request  of:  Mauricio Po, FNP   CC: Right shoulder pain f/u   [id number]  Tim Strickland is a 51 y.o. male coming in with complaint of right shoulder pain Failed all conservative therapy. Patient was sent for an MRI of the right shoulder. MRI showed significant intersubstance tearing of the supraspinatus tendon as well as significant degenerative fraying of the anterior and superior labrum. As well as some mild signs of a early adhesive capsulitis or synovitis. Patient states No significant change: Pain. Patient's no having difficulty. Trinity the exercises but seems to make him worse. Patient has improved range of motion he states but continues to have popping that can be painful.     Past Medical History:  Diagnosis Date  . Asthma   . Elevated liver enzymes    PMH of  on Pravachol  . Hyperglycemia    A1c 5.2% in 2010  . Hyperlipidemia    Past Surgical History:  Procedure Laterality Date  . APPENDECTOMY    . TONSILLECTOMY    . VASECTOMY    . WISDOM TOOTH EXTRACTION     Social History   Social History  . Marital status: Married    Spouse name: N/A  . Number of children: 2  . Years of education: 16   Occupational History  . Remodler    Social History Main Topics  . Smoking status: Never Smoker  . Smokeless tobacco: Never Used  . Alcohol use 12.6 - 16.8 oz/week    21 - 28 Cans of beer per week  . Drug use: No  . Sexual activity: Not on file   Other Topics Concern  . Not on file   Social History Narrative  . No narrative on file   Allergies  Allergen Reactions  . Pravastatin     04/03/2009: AST 51 and ALT 101 on pravastatin 20 mg daily. LDL at that time was 192.6.   Family History  Problem Relation Age of Onset  . Heart attack Father     pre 82  . Diabetes Sister     DM 1  . Heart attack Paternal  Grandfather      in 6s  . Gout Paternal Grandfather   . Cancer Paternal Uncle 41    bone   . Mental illness Maternal Grandmother     "breakdown"  . Healthy Mother     Past medical history, social, surgical and family history all reviewed in electronic medical record.  No pertanent information unless stated regarding to the chief complaint.   Review of Systems: No headache, visual changes, nausea, vomiting, diarrhea, constipation, dizziness, abdominal pain, skin rash, fevers, chills, night sweats, weight loss, swollen lymph nodes, body aches, joint swelling, muscle aches, chest pain, shortness of breath, mood changes.    Objective  Blood pressure 128/86, pulse 82, resp. rate 16, weight 178 lb 6 oz (80.9 kg), SpO2 98 %.   Systems examined below as of 06/21/16 General: NAD A&O x3 mood, affect normal  HEENT: Pupils equal, extraocular movements intact no nystagmus Respiratory: not short of breath at rest or with speaking Cardiovascular: No lower extremity edema, non tender Skin: Warm dry intact with no signs of infection or rash on extremities or on axial skeleton. Abdomen: Soft nontender, no masses Neuro: Cranial nerves  intact, neurovascularly intact in all extremities with 2+ DTRs  and 2+ pulses. Lymph: No lymphadenopathy appreciated today  Gait normal with good balance and coordination.  MSK: Non tender with full range of motion and good stability and symmetric strength and tone of , elbows, wrist,  knee hips and ankles bilaterally.   Shoulder: Right Continued scapular winging noted Palpation is normal with no tenderness over AC joint or bicipital groove. atient does have near full range of motion but does have crepitus Rotator cuff strength 4 out of 5 strength on the right side compared to 5 out of 5 on the contralateral side somewhat of an improvement in strength Positive impingement signs Speeds and Yergason's tests normal. Positive labral pathology No painful arc and no drop  arm sign. No apprehension sign Contralateral shoulder unremarkable  Procedure: Real-time Ultrasound Guided Injection of right glenohumeral joint Device: GE Logiq Q7  Ultrasound guided injection is preferred based studies that show increased duration, increased effect, greater accuracy, decreased procedural pain, increased response rate with ultrasound guided versus blind injection.  Verbal informed consent obtained.  Time-out conducted.  Noted no overlying erythema, induration, or other signs of local infection.  Skin prepped in a sterile fashion.  Local anesthesia: Topical Ethyl chloride.  With sterile technique and under real time ultrasound guidance:  Joint visualized.  23g 1  inch needle inserted posterior approach. Pictures taken for needle placement. Patient did have injection of 2 cc of 1% lidocaine, 2 cc of 0.5% Marcaine, and 1.0 cc of Kenalog 40 mg/dL. Completed without difficulty  Pain immediately resolved suggesting accurate placement of the medication.  Advised to call if fevers/chills, erythema, induration, drainage, or persistent bleeding.  Images permanently stored and available for review in the ultrasound unit.  Impression: Technically successful ultrasound guided injection.     Impression and Recommendations:     This case required medical decision making of moderate complexity.      Note: This dictation was prepared with Dragon dictation along with smaller phrase technology. Any transcriptional errors that result from this process are unintentional.

## 2016-06-21 ENCOUNTER — Ambulatory Visit (INDEPENDENT_AMBULATORY_CARE_PROVIDER_SITE_OTHER): Payer: BLUE CROSS/BLUE SHIELD | Admitting: Family Medicine

## 2016-06-21 ENCOUNTER — Ambulatory Visit: Payer: Self-pay

## 2016-06-21 ENCOUNTER — Encounter: Payer: Self-pay | Admitting: Family Medicine

## 2016-06-21 VITALS — BP 128/86 | HR 82 | Resp 16 | Wt 178.4 lb

## 2016-06-21 DIAGNOSIS — G8929 Other chronic pain: Secondary | ICD-10-CM

## 2016-06-21 DIAGNOSIS — M75101 Unspecified rotator cuff tear or rupture of right shoulder, not specified as traumatic: Secondary | ICD-10-CM | POA: Diagnosis not present

## 2016-06-21 DIAGNOSIS — M25511 Pain in right shoulder: Secondary | ICD-10-CM | POA: Diagnosis not present

## 2016-06-21 MED ORDER — NITROGLYCERIN 0.2 MG/HR TD PT24: MEDICATED_PATCH | TRANSDERMAL | 1 refills | Status: DC

## 2016-06-21 NOTE — Progress Notes (Deleted)
Pre-visit discussion using our clinic review tool. No additional management support is needed unless otherwise documented below in the visit note.  

## 2016-06-21 NOTE — Progress Notes (Signed)
Pre-visit discussion using our clinic review tool. No additional management support is needed unless otherwise documented below in the visit note.  

## 2016-06-21 NOTE — Patient Instructions (Addendum)
Good to see you  Good news overall.  We did injection today to help  Nitroglycerin Protocol   Apply 1/4 nitroglycerin patch to affected area daily.  Change position of patch within the affected area every 24 hours.  You may experience a headache during the first 1-2 weeks of using the patch, these should subside.  If you experience headaches after beginning nitroglycerin patch treatment, you may take your preferred over the counter pain reliever.  Another side effect of the nitroglycerin patch is skin irritation or rash related to patch adhesive.  Please notify our office if you develop more severe headaches or rash, and stop the patch.  Tendon healing with nitroglycerin patch may require 12 to 24 weeks depending on the extent of injury.  Men should not use if taking Viagra, Cialis, or Levitra.   Do not use if you have migraines or rosacea.  Start exercises in 72 hours  Do them 3 times a week See me again in 4 weeks.

## 2016-06-21 NOTE — Assessment & Plan Note (Signed)
Intersubstance tearing noted. Patient was given an injection today that hopefully patient will improve with. We discussed icing regimen and home exercises. We discussed which ones to do a which was to avoid. Started on nitroglycerin and warned of potential side effects. Patient will try these different interventions and come back and see me again in 4 weeks.

## 2016-08-01 ENCOUNTER — Other Ambulatory Visit: Payer: Self-pay | Admitting: Family

## 2016-08-01 DIAGNOSIS — M7541 Impingement syndrome of right shoulder: Secondary | ICD-10-CM

## 2016-09-02 ENCOUNTER — Other Ambulatory Visit: Payer: Self-pay | Admitting: Family

## 2016-09-02 DIAGNOSIS — J4520 Mild intermittent asthma, uncomplicated: Secondary | ICD-10-CM

## 2016-09-05 MED ORDER — MONTELUKAST SODIUM 10 MG PO TABS
10.0000 mg | ORAL_TABLET | Freq: Every day | ORAL | 0 refills | Status: DC
Start: 2016-09-05 — End: 2017-01-23

## 2016-10-31 ENCOUNTER — Encounter: Payer: Self-pay | Admitting: Family

## 2016-10-31 DIAGNOSIS — Z0000 Encounter for general adult medical examination without abnormal findings: Secondary | ICD-10-CM

## 2016-11-02 ENCOUNTER — Encounter: Payer: Self-pay | Admitting: Family

## 2016-11-02 ENCOUNTER — Other Ambulatory Visit (INDEPENDENT_AMBULATORY_CARE_PROVIDER_SITE_OTHER): Payer: BLUE CROSS/BLUE SHIELD

## 2016-11-02 DIAGNOSIS — Z0000 Encounter for general adult medical examination without abnormal findings: Secondary | ICD-10-CM | POA: Diagnosis not present

## 2016-11-02 DIAGNOSIS — Z125 Encounter for screening for malignant neoplasm of prostate: Secondary | ICD-10-CM

## 2016-11-02 LAB — CBC
HEMATOCRIT: 46.3 % (ref 39.0–52.0)
Hemoglobin: 16.1 g/dL (ref 13.0–17.0)
MCHC: 34.7 g/dL (ref 30.0–36.0)
MCV: 92.8 fl (ref 78.0–100.0)
PLATELETS: 267 10*3/uL (ref 150.0–400.0)
RBC: 5 Mil/uL (ref 4.22–5.81)
RDW: 13 % (ref 11.5–15.5)
WBC: 6.9 10*3/uL (ref 4.0–10.5)

## 2016-11-02 LAB — COMPREHENSIVE METABOLIC PANEL
ALT: 19 U/L (ref 0–53)
AST: 17 U/L (ref 0–37)
Albumin: 4.5 g/dL (ref 3.5–5.2)
Alkaline Phosphatase: 55 U/L (ref 39–117)
BUN: 13 mg/dL (ref 6–23)
CALCIUM: 9.9 mg/dL (ref 8.4–10.5)
CO2: 29 meq/L (ref 19–32)
Chloride: 103 mEq/L (ref 96–112)
Creatinine, Ser: 1.04 mg/dL (ref 0.40–1.50)
GFR: 80.12 mL/min (ref >60.00)
GLUCOSE: 101 mg/dL — AB (ref 70–99)
POTASSIUM: 5.5 meq/L — AB (ref 3.5–5.1)
Sodium: 139 mEq/L (ref 135–145)
Total Bilirubin: 0.7 mg/dL (ref 0.2–1.2)
Total Protein: 6.9 g/dL (ref 6.0–8.3)

## 2016-11-02 LAB — LIPID PANEL
CHOL/HDL RATIO: 4
CHOLESTEROL: 223 mg/dL — AB (ref 0–200)
HDL: 56.2 mg/dL (ref >39.00)
LDL CALC: 153 mg/dL — AB (ref 0–99)
NonHDL: 166.39
TRIGLYCERIDES: 69 mg/dL (ref 0.0–149.0)
VLDL: 13.8 mg/dL (ref 0.0–40.0)

## 2016-11-02 LAB — PSA: PSA: 0.94 ng/mL (ref 0.10–4.00)

## 2016-11-04 ENCOUNTER — Encounter: Payer: Self-pay | Admitting: Gastroenterology

## 2016-11-04 ENCOUNTER — Ambulatory Visit (INDEPENDENT_AMBULATORY_CARE_PROVIDER_SITE_OTHER): Payer: BLUE CROSS/BLUE SHIELD | Admitting: Family

## 2016-11-04 ENCOUNTER — Encounter: Payer: Self-pay | Admitting: Family

## 2016-11-04 VITALS — BP 144/96 | HR 77 | Temp 98.6°F | Resp 16 | Ht 73.0 in | Wt 180.0 lb

## 2016-11-04 DIAGNOSIS — R0789 Other chest pain: Secondary | ICD-10-CM | POA: Insufficient documentation

## 2016-11-04 DIAGNOSIS — Z1211 Encounter for screening for malignant neoplasm of colon: Secondary | ICD-10-CM | POA: Diagnosis not present

## 2016-11-04 DIAGNOSIS — Z0001 Encounter for general adult medical examination with abnormal findings: Secondary | ICD-10-CM | POA: Insufficient documentation

## 2016-11-04 HISTORY — DX: Encounter for general adult medical examination with abnormal findings: Z00.01

## 2016-11-04 NOTE — Assessment & Plan Note (Signed)
New onset chest tightness/pressure with questionable origin of muscle skeletal, respiratory, or cardiovascular. In office EKG performed today shows normal sinus rhythm and compared to previous tracings is with no significant change. Does not appear to be of cardiac origin, however pain continues considers cardiac stress test. Encouraged to restart taking montelukast on a regular basis. Follow-up if symptoms worsen or do not improve.

## 2016-11-04 NOTE — Assessment & Plan Note (Signed)
1) Anticipatory Guidance: Discussed importance of wearing a seatbelt while driving and not texting while driving; changing batteries in smoke detector at least once annually; wearing suntan lotion when outside; eating a balanced and moderate diet; getting physical activity at least 30 minutes per day.  2) Immunizations / Screenings / Labs:  Declines influenza. All other immunizations are up-to-date per recommendations. Due for colon cancer screening with referral to gastroenterology placed for colonoscopy. Obtain PSA for prostate cancer screening. All other screenings are up-to-date per recommendations. Previous blood work completed and reviewed with patient.  Overall well exam with risk factors for cardiovascular disease including hyperlipidemia. He is of good weight and exercises on a regular basis. Encouraged to continue healthy lifestyle behaviors and choices. Blood pressure slightly elevated today. Encouraged to monitor at home and follow low-sodium diet. Follow-up prevention exam in 1 year. Follow-up office visit pending blood work and for chronic conditions.

## 2016-11-04 NOTE — Progress Notes (Signed)
Subjective:    Patient ID: Tim Strickland, male    DOB: January 17, 1966, 51 y.o.   MRN: [id number]  Chief Complaint  Patient presents with  . CPE    not fasting    HPI:  Tim Strickland is a 51 y.o. male who presents today for an annual wellness visit.   1) Health Maintenance -   Diet - Averages about 2 meals per day consisting of a regular diet; Caffeine intake of about 2-3 cups per day on average.   Exercise - Works in Architect with very physical job   2) Publishing rights manager / Immunizations:  Dental -- Up to date  Vision -- Up to date   Health Maintenance  Topic Date Due  . HIV Screening  02/25/1981  . COLONOSCOPY  02/26/2016  . INFLUENZA VACCINE  10/05/2016  . TETANUS/TDAP  01/08/2024    Immunization History  Administered Date(s) Administered  . Influenza Split 01/06/2011  . Influenza Whole 12/05/2008  . Influenza,inj,Quad PF,6+ Mos 12/26/2012, 01/06/2015, 02/23/2016  . Tetanus 01/07/2014    3.) Malaise - This is a new problem. Associated symptom of tightness located in his chest and all over his upper extremity that has been going on for a couple of weeks. Located near the top of his chest. Generally waxes and wanes. He has cut back on exertion recently. No night time awakenings. No diaphoresis.   Allergies  Allergen Reactions  . Pravastatin     04/03/2009: AST 51 and ALT 101 on pravastatin 20 mg daily. LDL at that time was 192.6.     Outpatient Medications Prior to Visit  Medication Sig Dispense Refill  . Albuterol Sulfate (PROAIR RESPICLICK) 938 (90 Base) MCG/ACT AEPB Inhale 2 puffs into the lungs every 6 (six) hours as needed. 1 each 0  . aspirin 81 MG tablet Take 81 mg by mouth daily.    . montelukast (SINGULAIR) 10 MG tablet Take 1 tablet (10 mg total) by mouth at bedtime. Overdue for annual appt w/labs must see MD for refills 30 tablet 0  . Diclofenac Sodium (PENNSAID) 2 % SOLN Place 1 application onto the skin 2 (two) times daily as needed. 112  g 1  . DUEXIS 800-26.6 MG TABS TAKE ONE TABLET BY MOUTH THREE TIMES DAILY AS NEEDED 90 tablet 1  . montelukast (SINGULAIR) 10 MG tablet TAKE 1 TABLET AT BEDTIME 30 tablet 0  . nitroGLYCERIN (NITRODUR - DOSED IN MG/24 HR) 0.2 mg/hr patch 1/4 patch daily 30 patch 1   No facility-administered medications prior to visit.      Past Medical History:  Diagnosis Date  . Asthma   . Elevated liver enzymes    PMH of  on Pravachol  . Hyperglycemia    A1c 5.2% in 2010  . Hyperlipidemia      Past Surgical History:  Procedure Laterality Date  . APPENDECTOMY    . TONSILLECTOMY    . VASECTOMY    . WISDOM TOOTH EXTRACTION       Family History  Problem Relation Age of Onset  . Heart attack Father        pre 78  . Diabetes Sister        DM 1  . Heart attack Paternal Grandfather         in 40s  . Gout Paternal Grandfather   . Cancer Paternal Uncle 41       bone   . Mental illness Maternal Grandmother        "  breakdown"  . Healthy Mother      Social History   Social History  . Marital status: Married    Spouse name: N/A  . Number of children: 2  . Years of education: 16   Occupational History  . Remodler    Social History Main Topics  . Smoking status: Never Smoker  . Smokeless tobacco: Never Used  . Alcohol use 12.6 - 16.8 oz/week    21 - 28 Cans of beer per week  . Drug use: No  . Sexual activity: Not on file   Other Topics Concern  . Not on file   Social History Narrative  . No narrative on file      Review of Systems  Constitutional: Denies fever, chills, fatigue, or significant weight gain/loss. HENT: Head: Denies headache or neck pain Ears: Denies changes in hearing, ringing in ears, earache, drainage Nose: Denies discharge, stuffiness, itching, nosebleed, sinus pain Throat: Denies sore throat, hoarseness, dry mouth, sores, thrush Eyes: Denies loss/changes in vision, pain, redness, blurry/double vision, flashing lights Cardiovascular: Denies  palpitations, shortness of breath with activity, difficulty lying down, swelling, sudden awakening with shortness of breath Positive for chest tightness.  Respiratory: Denies shortness of breath, cough, sputum production, wheezing Gastrointestinal: Denies dysphasia, heartburn, change in appetite, nausea, change in bowel habits, rectal bleeding, constipation, diarrhea, yellow skin or eyes Genitourinary: Denies frequency, urgency, burning/pain, blood in urine, incontinence, change in urinary strength. Musculoskeletal: Denies muscle/joint pain, stiffness, back pain, redness or swelling of joints, trauma Skin: Denies rashes, lumps, itching, dryness, color changes, or hair/nail changes Neurological: Denies dizziness, fainting, seizures, weakness, numbness, tingling, tremor Psychiatric - Denies nervousness, stress, depression or memory loss Endocrine: Denies heat or cold intolerance, sweating, frequent urination, excessive thirst, changes in appetite Hematologic: Denies ease of bruising or bleeding     Objective:     BP (!) 144/96 (BP Location: Left Arm, Patient Position: Sitting, Cuff Size: Large)   Pulse 77   Temp 98.6 F (37 C) (Oral)   Resp 16   Ht 6\' 1"  (1.854 m)   Wt 180 lb (81.6 kg)   SpO2 98%   BMI 23.75 kg/m  Nursing note and vital signs reviewed.  Physical Exam  Constitutional: He is oriented to person, place, and time. He appears well-developed and well-nourished.  HENT:  Head: Normocephalic.  Right Ear: Hearing, tympanic membrane, external ear and ear canal normal.  Left Ear: Hearing, tympanic membrane, external ear and ear canal normal.  Nose: Nose normal.  Mouth/Throat: Uvula is midline, oropharynx is clear and moist and mucous membranes are normal.  Eyes: Pupils are equal, round, and reactive to light. Conjunctivae and EOM are normal.  Neck: Neck supple. No JVD present. No tracheal deviation present. No thyromegaly present.  Cardiovascular: Normal rate, regular rhythm,  normal heart sounds and intact distal pulses.   Pulmonary/Chest: Effort normal and breath sounds normal.  Abdominal: Soft. Bowel sounds are normal. He exhibits no distension and no mass. There is no tenderness. There is no rebound and no guarding.  Musculoskeletal: Normal range of motion. He exhibits no edema or tenderness.  Lymphadenopathy:    He has no cervical adenopathy.  Neurological: He is alert and oriented to person, place, and time. He has normal reflexes. No cranial nerve deficit. He exhibits normal muscle tone. Coordination normal.  Skin: Skin is warm and dry.  Psychiatric: He has a normal mood and affect. His behavior is normal. Judgment and thought content normal.  Assessment & Plan:   Problem List Items Addressed This Visit      Other   Encounter for general adult medical examination with abnormal findings - Primary    1) Anticipatory Guidance: Discussed importance of wearing a seatbelt while driving and not texting while driving; changing batteries in smoke detector at least once annually; wearing suntan lotion when outside; eating a balanced and moderate diet; getting physical activity at least 30 minutes per day.  2) Immunizations / Screenings / Labs:  Declines influenza. All other immunizations are up-to-date per recommendations. Due for colon cancer screening with referral to gastroenterology placed for colonoscopy. Obtain PSA for prostate cancer screening. All other screenings are up-to-date per recommendations. Previous blood work completed and reviewed with patient.  Overall well exam with risk factors for cardiovascular disease including hyperlipidemia. He is of good weight and exercises on a regular basis. Encouraged to continue healthy lifestyle behaviors and choices. Blood pressure slightly elevated today. Encouraged to monitor at home and follow low-sodium diet. Follow-up prevention exam in 1 year. Follow-up office visit pending blood work and for chronic  conditions.      Relevant Orders   CBC   Comprehensive metabolic panel   Lipid panel   PSA   Chest tightness or pressure    New onset chest tightness/pressure with questionable origin of muscle skeletal, respiratory, or cardiovascular. In office EKG performed today shows normal sinus rhythm and compared to previous tracings is with no significant change. Does not appear to be of cardiac origin, however pain continues considers cardiac stress test. Encouraged to restart taking montelukast on a regular basis. Follow-up if symptoms worsen or do not improve.      Relevant Orders   EKG 12-Lead (Completed)    Other Visit Diagnoses    Colon cancer screening       Relevant Orders   Ambulatory referral to Gastroenterology       I have discontinued Mr. Moone's Diclofenac Sodium, nitroGLYCERIN, and DUEXIS. I am also having him maintain his Albuterol Sulfate, aspirin, and montelukast.  Follow-up: Return if symptoms worsen or fail to improve.   Mauricio Po, FNP

## 2016-11-04 NOTE — Patient Instructions (Addendum)
Thank you for choosing [facility].  SUMMARY AND INSTRUCTIONS:  Medication:  Please continue to take your medications as prescribed.   Would recommend considering cholesterol medication.   Restart taking the montelukast.  If your symptoms continue or worsen we may consider a stress test.   Follow up:  If your symptoms worsen or fail to improve, please contact our office for further instruction, or in case of emergency go directly to the emergency room at the closest medical facility.     Health Maintenance, Male A healthy lifestyle and preventive care is important for your health and wellness. Ask your health care provider about what schedule of regular examinations is right for you. What should I know about weight and diet? Eat a Healthy Diet  Eat plenty of vegetables, fruits, whole grains, low-fat dairy products, and lean protein.  Do not eat a lot of foods high in solid fats, added sugars, or salt.  Maintain a Healthy Weight Regular exercise can help you achieve or maintain a healthy weight. You should:  Do at least 150 minutes of exercise each week. The exercise should increase your heart rate and make you sweat (moderate-intensity exercise).  Do strength-training exercises at least twice a week.  Watch Your Levels of Cholesterol and Blood Lipids  Have your blood tested for lipids and cholesterol every 5 years starting at 51 years of age. If you are at high risk for heart disease, you should start having your blood tested when you are 51 years old. You may need to have your cholesterol levels checked more often if: ? Your lipid or cholesterol levels are high. ? You are older than 51 years of age. ? You are at high risk for heart disease.  What should I know about cancer screening? Many types of cancers can be detected early and may often be prevented. Lung Cancer  You should be screened every year for lung cancer if: ? You are a current smoker who has smoked  for at least 30 years. ? You are a former smoker who has quit within the past 15 years.  Talk to your health care provider about your screening options, when you should start screening, and how often you should be screened.  Colorectal Cancer  Routine colorectal cancer screening usually begins at 51 years of age and should be repeated every 5-10 years until you are 51 years old. You may need to be screened more often if early forms of precancerous polyps or small growths are found. Your health care provider may recommend screening at an earlier age if you have risk factors for colon cancer.  Your health care provider may recommend using home test kits to check for hidden blood in the stool.  A small camera at the end of a tube can be used to examine your colon (sigmoidoscopy or colonoscopy). This checks for the earliest forms of colorectal cancer.  Prostate and Testicular Cancer  Depending on your age and overall health, your health care provider may do certain tests to screen for prostate and testicular cancer.  Talk to your health care provider about any symptoms or concerns you have about testicular or prostate cancer.  Skin Cancer  Check your skin from head to toe regularly.  Tell your health care provider about any new moles or changes in moles, especially if: ? There is a change in a mole's size, shape, or color. ? You have a mole that is larger than a pencil eraser.  Always use sunscreen.  Apply sunscreen liberally and repeat throughout the day.  Protect yourself by wearing long sleeves, pants, a wide-brimmed hat, and sunglasses when outside.  What should I know about heart disease, diabetes, and high blood pressure?  If you are 67-48 years of age, have your blood pressure checked every 3-5 years. If you are 75 years of age or older, have your blood pressure checked every year. You should have your blood pressure measured twice-once when you are at a hospital or clinic, and  once when you are not at a hospital or clinic. Record the average of the two measurements. To check your blood pressure when you are not at a hospital or clinic, you can use: ? An automated blood pressure machine at a pharmacy. ? A home blood pressure monitor.  Talk to your health care provider about your target blood pressure.  If you are between 7-3 years old, ask your health care provider if you should take aspirin to prevent heart disease.  Have regular diabetes screenings by checking your fasting blood sugar level. ? If you are at a normal weight and have a low risk for diabetes, have this test once every three years after the age of 66. ? If you are overweight and have a high risk for diabetes, consider being tested at a younger age or more often.  A one-time screening for abdominal aortic aneurysm (AAA) by ultrasound is recommended for men aged 17-75 years who are current or former smokers. What should I know about preventing infection? Hepatitis B If you have a higher risk for hepatitis B, you should be screened for this virus. Talk with your health care provider to find out if you are at risk for hepatitis B infection. Hepatitis C Blood testing is recommended for:  Everyone born from [birth year] through 1965.  Anyone with known risk factors for hepatitis C.  Sexually Transmitted Diseases (STDs)  You should be screened each year for STDs including gonorrhea and chlamydia if: ? You are sexually active and are younger than 51 years of age. ? You are older than 51 years of age and your health care provider tells you that you are at risk for this type of infection. ? Your sexual activity has changed since you were last screened and you are at an increased risk for chlamydia or gonorrhea. Ask your health care provider if you are at risk.  Talk with your health care provider about whether you are at high risk of being infected with HIV. Your health care provider may recommend a  prescription medicine to help prevent HIV infection.  What else can I do?  Schedule regular health, dental, and eye exams.  Stay current with your vaccines (immunizations).  Do not use any tobacco products, such as cigarettes, chewing tobacco, and e-cigarettes. If you need help quitting, ask your health care provider.  Limit alcohol intake to no more than 2 drinks per day. One drink equals 12 ounces of beer, 5 ounces of wine, or 1 ounces of hard liquor.  Do not use street drugs.  Do not share needles.  Ask your health care provider for help if you need support or information about quitting drugs.  Tell your health care provider if you often feel depressed.  Tell your health care provider if you have ever been abused or do not feel safe at home. This information is not intended to replace advice given to you by your health care provider. Make sure you discuss any questions you have  with your health care provider. Document Released: 08/20/2007 Document Revised: 10/21/2015 Document Reviewed: 11/25/2014 Elsevier Interactive Patient Education  [employer].

## 2016-11-29 ENCOUNTER — Encounter: Payer: Self-pay | Admitting: Occupational Therapy

## 2016-11-29 NOTE — Therapy (Signed)
[facility] [address] [place], [place], [address] Phone: [phone]   Fax:  [phone]  Patient Details  Name: Tim Strickland MRN: [id number] Date of Birth: 05/21/1965 Referring Provider:  Dr. Mauricio Po Encounter Date: 11/29/2016  OCCUPATIONAL THERAPY DISCHARGE SUMMARY  Visits from Start of Care: 12  Current functional level related to goals / functional outcomes:     OT Long Term Goals - 04/25/16 0912      OT LONG TERM GOAL #1   Title Pt will be independent with updated/strengthening.--check 05/06/16   Time 8   Period Weeks   Status Achieved     OT LONG TERM GOAL #2   Title Pt will report pain less than or equal to 4/10 or less with work/ADL tasks.   Time 8   Period Weeks   Status unknown     OT LONG TERM GOAL #3   Title Pt will demo at least 85* R shoulder abduction for lateral reaching.   Baseline 55*   Time 8   Period Weeks   Status unknown     OT LONG TERM GOAL #4   Title Pt will demo good body mechanics/positioning during simulated work tasks.   Time 8   Period Weeks   Status unknown     Pt did not return after 04/25/16 to assess LTG's.    Remaining deficits: Shoulder ROM  Pain in shoulder   Education / Equipment: HEP's, pain management strategies  Plan: Patient agrees to discharge.  Patient goals were not met. Patient is being discharged due to not returning since the last visit.  ?????        Carey Bullocks, OTR/L 11/29/2016, 11:35 AM  [facility] [address] [place], [place], [address] Phone: [phone]   Fax:  [phone]

## 2016-12-13 ENCOUNTER — Encounter: Payer: Self-pay | Admitting: Family

## 2017-01-02 ENCOUNTER — Encounter: Payer: BLUE CROSS/BLUE SHIELD | Admitting: Gastroenterology

## 2017-01-20 ENCOUNTER — Encounter: Payer: Self-pay | Admitting: Family

## 2017-01-20 ENCOUNTER — Other Ambulatory Visit: Payer: Self-pay | Admitting: Family

## 2017-01-20 DIAGNOSIS — J4520 Mild intermittent asthma, uncomplicated: Secondary | ICD-10-CM

## 2017-01-23 ENCOUNTER — Other Ambulatory Visit: Payer: Self-pay

## 2017-01-23 DIAGNOSIS — J4520 Mild intermittent asthma, uncomplicated: Secondary | ICD-10-CM

## 2017-01-23 MED ORDER — ALBUTEROL SULFATE 108 (90 BASE) MCG/ACT IN AEPB: 2.0000 | INHALATION_SPRAY | Freq: Four times a day (QID) | RESPIRATORY_TRACT | 0 refills | Status: DC | PRN

## 2017-01-23 MED ORDER — MONTELUKAST SODIUM 10 MG PO TABS: 10.0000 mg | ORAL_TABLET | Freq: Every day | ORAL | 0 refills | Status: DC

## 2017-01-23 NOTE — Telephone Encounter (Signed)
Patient is requesting a refill on all his medication. He has set up a transfer care with [facility] for 03/29/16

## 2017-01-24 ENCOUNTER — Other Ambulatory Visit: Payer: Self-pay | Admitting: *Deleted

## 2017-01-24 DIAGNOSIS — J4520 Mild intermittent asthma, uncomplicated: Secondary | ICD-10-CM

## 2017-01-24 MED ORDER — MONTELUKAST SODIUM 10 MG PO TABS: 10.0000 mg | ORAL_TABLET | Freq: Every day | ORAL | 2 refills | Status: DC

## 2017-03-29 ENCOUNTER — Encounter: Payer: Self-pay | Admitting: Nurse Practitioner

## 2017-03-29 ENCOUNTER — Ambulatory Visit: Payer: BLUE CROSS/BLUE SHIELD | Admitting: Nurse Practitioner

## 2017-03-29 VITALS — BP 134/94 | HR 79 | Temp 98.4°F | Resp 16 | Ht 73.0 in | Wt 180.0 lb

## 2017-03-29 DIAGNOSIS — R030 Elevated blood-pressure reading, without diagnosis of hypertension: Secondary | ICD-10-CM

## 2017-03-29 DIAGNOSIS — J4520 Mild intermittent asthma, uncomplicated: Secondary | ICD-10-CM

## 2017-03-29 DIAGNOSIS — Z23 Encounter for immunization: Secondary | ICD-10-CM

## 2017-03-29 MED ORDER — ALBUTEROL SULFATE HFA 108 (90 BASE) MCG/ACT IN AERS: 2.0000 | INHALATION_SPRAY | Freq: Four times a day (QID) | RESPIRATORY_TRACT | 3 refills | Status: DC | PRN

## 2017-03-29 MED ORDER — MONTELUKAST SODIUM 10 MG PO TABS: 10.0000 mg | ORAL_TABLET | Freq: Every day | ORAL | 2 refills | Status: DC

## 2017-03-29 NOTE — Patient Instructions (Signed)
I have sent a refill of your asthma medications as requested.  Please come back in about 2 weeks with your blood pressure log and blood pressure cuff. We will check your cuff with ours for accuracy and see how your blood pressure readings are at home.  It was nice to meet you. Thanks for letting me take care of you today :)   Mediterranean Diet A Mediterranean diet refers to food and lifestyle choices that are based on the traditions of countries located on the [employer]. This way of eating has been shown to help prevent certain conditions and improve outcomes for people who have chronic diseases, like kidney disease and heart disease. What are tips for following this plan? Lifestyle  Cook and eat meals together with your family, when possible.  Drink enough fluid to keep your urine clear or pale yellow.  Be physically active every day. This includes: ? Aerobic exercise like running or swimming. ? Leisure activities like gardening, walking, or housework.  Get 7-8 hours of sleep each night.  If recommended by your health care provider, drink red wine in moderation. This means 1 glass a day for nonpregnant women and 2 glasses a day for men. A glass of wine equals 5 oz (150 mL). Reading food labels  Check the serving size of packaged foods. For foods such as rice and pasta, the serving size refers to the amount of cooked product, not dry.  Check the total fat in packaged foods. Avoid foods that have saturated fat or trans fats.  Check the ingredients list for added sugars, such as corn syrup. Shopping  At the grocery store, buy most of your food from the areas near the walls of the store. This includes: ? Fresh fruits and vegetables (produce). ? Grains, beans, nuts, and seeds. Some of these may be available in unpackaged forms or large amounts (in bulk). ? Fresh seafood. ? Poultry and eggs. ? Low-fat dairy products.  Buy whole ingredients instead of prepackaged  foods.  Buy fresh fruits and vegetables in-season from local farmers markets.  Buy frozen fruits and vegetables in resealable bags.  If you do not have access to quality fresh seafood, buy precooked frozen shrimp or canned fish, such as tuna, salmon, or sardines.  Buy small amounts of raw or cooked vegetables, salads, or olives from the deli or salad bar at your store.  Stock your pantry so you always have certain foods on hand, such as olive oil, canned tuna, canned tomatoes, rice, pasta, and beans. Cooking  Cook foods with extra-virgin olive oil instead of using butter or other vegetable oils.  Have meat as a side dish, and have vegetables or grains as your main dish. This means having meat in small portions or adding small amounts of meat to foods like pasta or stew.  Use beans or vegetables instead of meat in common dishes like chili or lasagna.  Experiment with different cooking methods. Try roasting or broiling vegetables instead of steaming or sauteing them.  Add frozen vegetables to soups, stews, pasta, or rice.  Add nuts or seeds for added healthy fat at each meal. You can add these to yogurt, salads, or vegetable dishes.  Marinate fish or vegetables using olive oil, lemon juice, garlic, and fresh herbs. Meal planning  Plan to eat 1 vegetarian meal one day each week. Try to work up to 2 vegetarian meals, if possible.  Eat seafood 2 or more times a week.  Have healthy snacks readily available,  such as: ? Vegetable sticks with hummus. ? [place] yogurt. ? Fruit and nut trail mix.  Eat balanced meals throughout the week. This includes: ? Fruit: 2-3 servings a day ? Vegetables: 4-5 servings a day ? Low-fat dairy: 2 servings a day ? Fish, poultry, or lean meat: 1 serving a day ? Beans and legumes: 2 or more servings a week ? Nuts and seeds: 1-2 servings a day ? Whole grains: 6-8 servings a day ? Extra-virgin olive oil: 3-4 servings a day  Limit red meat and sweets to  only a few servings a month What are my food choices?  Mediterranean diet ? Recommended ? Grains: Whole-grain pasta. Brown rice. Bulgar wheat. Polenta. Couscous. Whole-wheat bread. [place]. ? Vegetables: Artichokes. Beets. Broccoli. Cabbage. Carrots. Eggplant. Green beans. Chard. Kale. Spinach. Onions. Leeks. Peas. Squash. Tomatoes. Peppers. Radishes. ? Fruits: Apples. Apricots. Avocado. Berries. Bananas. Cherries. Dates. Figs. Grapes. Lemons. Melon. Oranges. Peaches. Plums. Pomegranate. ? Meats and other protein foods: Beans. Almonds. Sunflower seeds. Pine nuts. Peanuts. [place]. Salmon. Scallops. Shrimp. [place]. Tilapia. Clams. Oysters. Eggs. ? Dairy: Low-fat milk. Cheese. Greek yogurt. ? Beverages: Water. Red wine. Herbal tea. ? Fats and oils: Extra virgin olive oil. Avocado oil. Grape seed oil. ? Sweets and desserts: [place] yogurt with honey. Baked apples. Poached pears. Trail mix. ? Seasoning and other foods: Basil. Cilantro. Coriander. Cumin. Mint. Parsley. Sage. Rosemary. Tarragon. Garlic. Oregano. Thyme. Pepper. Balsalmic vinegar. Tahini. Hummus. Tomato sauce. Olives. Mushrooms. ? Limit these ? Grains: Prepackaged pasta or rice dishes. Prepackaged cereal with added sugar. ? Vegetables: Deep fried potatoes (french fries). ? Fruits: Fruit canned in syrup. ? Meats and other protein foods: Beef. Pork. Lamb. Poultry with skin. Hot dogs. [place] Salines. ? Dairy: Ice cream. Sour cream. Whole milk. ? Beverages: Juice. Sugar-sweetened soft drinks. Beer. Liquor and spirits. ? Fats and oils: Butter. Canola oil. Vegetable oil. Beef fat (tallow). Lard. ? Sweets and desserts: Cookies. Cakes. Pies. Candy. ? Seasoning and other foods: Mayonnaise. Premade sauces and marinades. ? The items listed may not be a complete list. Talk with your dietitian about what dietary choices are right for you. Summary  The Mediterranean diet includes both food and lifestyle choices.  Eat a variety of fresh fruits and  vegetables, beans, nuts, seeds, and whole grains.  Limit the amount of red meat and sweets that you eat.  Talk with your health care provider about whether it is safe for you to drink red wine in moderation. This means 1 glass a day for nonpregnant women and 2 glasses a day for men. A glass of wine equals 5 oz (150 mL). This information is not intended to replace advice given to you by your health care provider. Make sure you discuss any questions you have with your health care provider. Document Released: 10/15/2015 Document Revised: 11/17/2015 Document Reviewed: 10/15/2015 Elsevier Interactive Patient Education  [employer].

## 2017-03-29 NOTE — Assessment & Plan Note (Signed)
Stable,continue current medications. He understands to follow up if symptoms worsen or he feels the need to use albuterol HFA more frequently. - albuterol (PROVENTIL HFA;VENTOLIN HFA) 108 (90 Base) MCG/ACT inhaler; Inhale 2 puffs into the lungs every 6 (six) hours as needed for wheezing or shortness of breath.  Dispense: 1 Inhaler; Refill: 3 - montelukast (SINGULAIR) 10 MG tablet; Take 1 tablet (10 mg total) by mouth at bedtime. Must keep Jan appt w/new provider for future refills  Dispense: 30 tablet; Refill: 2

## 2017-03-29 NOTE — Progress Notes (Signed)
Name: Tim Strickland   MRN: [id number]    DOB: [birth year]   Date:03/29/2017       Progress Note  Subjective  Chief Complaint  Chief Complaint  Patient presents with  . Establish Care    medication refills    HPI Tim Strickland would like a refill of his albuterol and singulair today. His blood pressure is also elevated.  Asthma-he is maintained on singulair and albuterol. His symptoms occur only occasionally. He works in [employer] and he typically only experiences his symptoms when working on some jobs with exposure to more dust or dirt- this is probably about 5-10 times a year. When he knows that he is working a job that may flare his asthma he will begin singulair which typically prevents an asthma flare. He rarely uses his albuterol  He denies fevers,  No fevers, cough, wheezing, shortness of breath, nighttime awakenings.  Elevated blood pressure reading- he is not maintained on any antihypertensive medications. He reports elevated BP reading in the office in the past, but when he occasionally checks at home his readings are lower- 130s/80s-90s Denies headaches, vision changes, chest pain, edema.  BP Readings from Last 3 Encounters:  03/29/17 (!) 134/94  11/04/16 (!) 144/96  06/21/16 128/86    Patient Active Problem List   Diagnosis Date Noted  . Encounter for general adult medical examination with abnormal findings 11/04/2016  . Chest tightness or pressure 11/04/2016  . Right rotator cuff tear 04/26/2016  . Shoulder impingement syndrome, right 02/23/2016  . Left wrist tendinitis 02/23/2016  . Synovitis of toe 02/23/2016  . Bursitis of right shoulder 01/15/2014  . Hyperlipidemia 12/05/2008  . Asthma 12/05/2008    Past Surgical History:  Procedure Laterality Date  . APPENDECTOMY    . TONSILLECTOMY    . VASECTOMY    . WISDOM TOOTH EXTRACTION      Family History  Problem Relation Age of Onset  . Heart attack Father        pre 69  . Diabetes Sister        DM 1   . Heart attack Paternal Grandfather         in 16s  . Gout Paternal Grandfather   . Cancer Paternal Uncle 41       bone   . Mental illness Maternal Grandmother        "breakdown"  . Healthy Mother     Social History   Socioeconomic History  . Marital status: Married    Spouse name: Not on file  . Number of children: 2  . Years of education: 74  . Highest education level: Not on file  Social Needs  . Financial resource strain: Not on file  . Food insecurity - worry: Not on file  . Food insecurity - inability: Not on file  . Transportation needs - medical: Not on file  . Transportation needs - non-medical: Not on file  Occupational History  . Occupation: Remodler  Tobacco Use  . Smoking status: Never Smoker  . Smokeless tobacco: Never Used  Substance and Sexual Activity  . Alcohol use: Yes    Alcohol/week: 12.6 - 16.8 oz    Types: 21 - 28 Cans of beer per week  . Drug use: No  . Sexual activity: Not on file  Other Topics Concern  . Not on file  Social History Narrative  . Not on file     Current Outpatient Medications:  .  albuterol (PROVENTIL HFA;VENTOLIN HFA) 108 (90  Base) MCG/ACT inhaler, Inhale 2 puffs into the lungs every 6 (six) hours as needed for wheezing or shortness of breath., Disp: , Rfl:  .  aspirin 81 MG tablet, Take 81 mg by mouth daily., Disp: , Rfl:  .  montelukast (SINGULAIR) 10 MG tablet, Take 1 tablet (10 mg total) by mouth at bedtime. Must keep Jan appt w/new provider for future refills, Disp: 30 tablet, Rfl: 2  Allergies  Allergen Reactions  . Pravastatin     04/03/2009: AST 51 and ALT 101 on pravastatin 20 mg daily. LDL at that time was 192.6.     ROS See HPI  Objective  Vitals:   03/29/17 0859  BP: (!) 134/94  Pulse: 79  Resp: 16  Temp: 98.4 F (36.9 C)  TempSrc: Oral  SpO2: 97%  Weight: 180 lb (81.6 kg)  Height: 6\' 1"  (1.854 m)   Will f/u in about 2 weeks for elevated BP reading  Body mass index is 23.75  kg/m.  Physical Exam Constitutional: Patient appears well-developed and well-nourished. No distress.  HENT: Head: Normocephalic and atraumatic. Nose: Nose normal. Mouth/Throat: Oropharynx is clear and moist. No oropharyngeal exudate.  Eyes: Conjunctivae are normal. No scleral icterus.  Neck: Normal range of motion. Neck supple. No thyromegaly present.  Cardiovascular: Normal rate, regular rhythm and normal heart sounds.  No BLE edema. Distal pulses intact. Pulmonary/Chest: Effort normal and breath sounds normal. No respiratory distress. Abdominal: Soft. No distension. Musculoskeletal: Normal range of motion, no joint effusions. No gross deformities Neurological: he is alert and oriented to person, place, and time. Coordination, balance, strength, speech and gait are normal.  Skin: Skin is warm and dry. No rash noted. Psychiatric: Patient has a normal mood and affect. behavior is normal. Judgment and thought content normal.  PHQ2/9: Depression screen [facility] 2/9 11/04/2016 12/26/2012  Decreased Interest 0 0  Down, Depressed, Hopeless 0 0  PHQ - 2 Score 0 0   Fall Risk: Fall Risk  11/04/2016  Falls in the past year? No   Assessment & Plan  Health Maintenance reviewed:  Need for influenza vaccination - Flu Vaccine QUAD 36+ mos IM  Mild intermittent asthma without complication   Elevated blood pressure reading We discussed healthy diet and exercise in overall maintenance of health. See AVS for education provided. He will return in 2 weeks with home blood pressure cuff and log and we will discuss initiation of BP medication if readings remain elevated.

## 2017-04-12 ENCOUNTER — Ambulatory Visit: Payer: BLUE CROSS/BLUE SHIELD | Admitting: Nurse Practitioner

## 2017-09-01 ENCOUNTER — Encounter: Payer: Self-pay | Admitting: Nurse Practitioner

## 2017-11-24 ENCOUNTER — Ambulatory Visit: Payer: 59 | Admitting: Nurse Practitioner

## 2017-11-24 ENCOUNTER — Other Ambulatory Visit: Payer: Self-pay | Admitting: Nurse Practitioner

## 2017-11-24 ENCOUNTER — Encounter: Payer: Self-pay | Admitting: Nurse Practitioner

## 2017-11-24 ENCOUNTER — Other Ambulatory Visit: Payer: 59

## 2017-11-24 VITALS — BP 138/98 | HR 76 | Temp 97.7°F | Ht 73.0 in | Wt 181.0 lb

## 2017-11-24 DIAGNOSIS — J029 Acute pharyngitis, unspecified: Secondary | ICD-10-CM | POA: Diagnosis not present

## 2017-11-24 DIAGNOSIS — R030 Elevated blood-pressure reading, without diagnosis of hypertension: Secondary | ICD-10-CM | POA: Diagnosis not present

## 2017-11-24 LAB — POCT RAPID STREP A (OFFICE): RAPID STREP A SCREEN: NEGATIVE

## 2017-11-24 LAB — STREP COMPLETE PANEL
Strep Pyogenes: NEGATIVE
Strep dysgalactiae: POSITIVE — AB

## 2017-11-24 MED ORDER — AMOXICILLIN 500 MG PO CAPS: 500.0000 mg | ORAL_CAPSULE | Freq: Two times a day (BID) | ORAL | 0 refills | Status: DC

## 2017-11-24 NOTE — Progress Notes (Signed)
Name: Tim Strickland   MRN: [id number]    DOB: [birth year]   Date:11/24/2017       Progress Note  Subjective  Chief Complaint Sore throat  HPI Mr Lazcano is here today for sore throat and malaise, began yesterday when he noticed sore throat with white spots in the back of his throat, since then has gotten worse and feeling tired and achy. Hurts to swallow. His wife has similar symptoms. He denies fevers, chills, headaches, rhinorrhea, sneezing, itchy water eyes, chest pain, cough, shortness of breath. His blood pressure is also elevated today, I saw him in January to establish care and his BP was elevated, he did not return for follow up  Patient Active Problem List   Diagnosis Date Noted  . Encounter for general adult medical examination with abnormal findings 11/04/2016  . Right rotator cuff tear 04/26/2016  . Shoulder impingement syndrome, right 02/23/2016  . Left wrist tendinitis 02/23/2016  . Synovitis of toe 02/23/2016  . Bursitis of right shoulder 01/15/2014  . Hyperlipidemia 12/05/2008  . Asthma 12/05/2008    Social History   Tobacco Use  . Smoking status: Never Smoker  . Smokeless tobacco: Never Used  Substance Use Topics  . Alcohol use: Yes    Alcohol/week: 21.0 - 28.0 standard drinks    Types: 21 - 28 Cans of beer per week     Current Outpatient Medications:  .  albuterol (PROVENTIL HFA;VENTOLIN HFA) 108 (90 Base) MCG/ACT inhaler, Inhale 2 puffs into the lungs every 6 (six) hours as needed for wheezing or shortness of breath., Disp: 1 Inhaler, Rfl: 3 .  aspirin 81 MG tablet, Take 81 mg by mouth daily., Disp: , Rfl:  .  montelukast (SINGULAIR) 10 MG tablet, Take 1 tablet (10 mg total) by mouth at bedtime. Must keep Jan appt w/new provider for future refills, Disp: 30 tablet, Rfl: 2  Allergies  Allergen Reactions  . Pravastatin     04/03/2009: AST 51 and ALT 101 on pravastatin 20 mg daily. LDL at that time was 192.6.    ROS  No other specific complaints in a  complete review of systems (except as listed in HPI above).  Objective  Vitals:   11/24/17 0955  BP: (!) 138/98  Pulse: 76  Temp: 97.7 F (36.5 C)  TempSrc: Oral  SpO2: 99%  Weight: 181 lb (82.1 kg)  Height: 6\' 1"  (1.854 m)    Body mass index is 23.88 kg/m.  Nursing Note and Vital Signs reviewed.  Physical Exam  Constitutional: Patient appears well-developed and well-nourished.  No distress.  HEENT: head atraumatic, normocephalic, pupils equal and reactive to light, EOM's intact, TM's without erythema or bulging,  no maxillary or frontal sinus tenderness , neck supple without lymphadenopathy, oropharynx erythematous with white exudate Cardiovascular: Normal rate, regular rhythm, S1/S2 present.  No murmur or rub heard. No BLE edema. Pulmonary/Chest: Effort normal and breath sounds clear. No respiratory distress or retractions. Musculoskeletal: Normal range of motion, no joint effusions. No gross deformities Neurological: He is alert and oriented to person, place, and time. No cranial nerve deficit. Coordination, balance, strength, speech and gait are normal.  Skin: Skin is warm and dry. No rash noted. No erythema.  Psychiatric: Patient has a normal mood and affect. behavior is normal. Judgment and thought content normal.    Assessment & Plan RTC in 2 weeks for F/U: recheck BP  Sore throat Rapid strep negative, strep panel ordered for additional testing Discussed that symptoms could be viral  at this point, home management, Red flags and when to present for emergency care or RTC including fever >101.49F,  new/worsening/un-resolving symptoms,  reviewed with patient at time of visit. Follow up and care instructions discussed and provided in AVS. - POCT rapid strep A-negative - Strep Complete Panel; Future  Elevated blood pressure reading BP remains elevated today but here for acute complaint I have asked him to RTC in about 2 weeks for F/U- repeat BP reading, consider starting  antihypertensives if BP remains elevated

## 2017-11-24 NOTE — Patient Instructions (Signed)
Please head downstairs for lab work  For drainage may use Allegra, Claritin or Zyrtec. If you need stronger medicine to stop drainage may take Chlor-Trimeton 2-4 mg every 4 hours. This may cause drowsiness. Honey for sore throat. Salt water and baking soda gargles for sore throat. Ibuprofen 600 mg every 6 hours as needed for pain, discomfort or fever. Drink plenty of fluids and stay well-hydrated.  please follow up for fevers over 101, worsening symptoms, or if symptoms are not getting better in 1 week.  Sore Throat When you have a sore throat, your throat may:  Hurt.  Burn.  Feel irritated.  Feel scratchy.  Many things can cause a sore throat, including:  An infection.  Allergies.  Dryness in the air.  Smoke or pollution.  Gastroesophageal reflux disease (GERD).  A tumor.  A sore throat can be the first sign of another sickness. It can happen with other problems, like coughing or a fever. Most sore throats go away without treatment. Follow these instructions at home:  Take over-the-counter medicines only as told by your doctor.  Drink enough fluids to keep your pee (urine) clear or pale yellow.  Rest when you feel you need to.  To help with pain, try: ? Sipping warm liquids, such as broth, herbal tea, or warm water. ? Eating or drinking cold or frozen liquids, such as frozen ice pops. ? Gargling with a salt-water mixture 3-4 times a day or as needed. To make a salt-water mixture, add -1 tsp of salt in 1 cup of warm water. Mix it until you cannot see the salt anymore. ? Sucking on hard candy or throat lozenges. ? Putting a cool-mist humidifier in your bedroom at night. ? Sitting in the bathroom with the door closed for 5-10 minutes while you run hot water in the shower.  Do not use any tobacco products, such as cigarettes, chewing tobacco, and e-cigarettes. If you need help quitting, ask your doctor. Contact a doctor if:  You have a fever for more than 2-3  days.  You keep having symptoms for more than 2-3 days.  Your throat does not get better in 7 days.  You have a fever and your symptoms suddenly get worse. Get help right away if:  You have trouble breathing.  You cannot swallow fluids, soft foods, or your saliva.  You have swelling in your throat or neck that gets worse.  You keep feeling like you are going to throw up (vomit).  You keep throwing up. This information is not intended to replace advice given to you by your health care provider. Make sure you discuss any questions you have with your health care provider. Document Released: 12/01/2007 Document Revised: 10/18/2015 Document Reviewed: 12/12/2014 Elsevier Interactive Patient Education  [employer].

## 2017-12-02 ENCOUNTER — Other Ambulatory Visit: Payer: Self-pay | Admitting: Nurse Practitioner

## 2017-12-02 DIAGNOSIS — J4520 Mild intermittent asthma, uncomplicated: Secondary | ICD-10-CM

## 2018-01-09 ENCOUNTER — Ambulatory Visit: Payer: 59 | Admitting: Family

## 2018-01-09 ENCOUNTER — Encounter: Payer: Self-pay | Admitting: Family

## 2018-01-09 VITALS — BP 140/90 | HR 87 | Temp 98.1°F | Ht 73.0 in | Wt 177.0 lb

## 2018-01-09 DIAGNOSIS — M79661 Pain in right lower leg: Secondary | ICD-10-CM | POA: Diagnosis not present

## 2018-01-09 DIAGNOSIS — R0789 Other chest pain: Secondary | ICD-10-CM | POA: Diagnosis not present

## 2018-01-09 NOTE — Progress Notes (Signed)
Tim Strickland is a 52 y.o. male with the following history as recorded in [employer]:  Patient Active Problem List   Diagnosis Date Noted  . Encounter for general adult medical examination with abnormal findings 11/04/2016  . Right rotator cuff tear 04/26/2016  . Shoulder impingement syndrome, right 02/23/2016  . Left wrist tendinitis 02/23/2016  . Synovitis of toe 02/23/2016  . Bursitis of right shoulder 01/15/2014  . Hyperlipidemia 12/05/2008  . Asthma 12/05/2008    Current Outpatient Medications  Medication Sig Dispense Refill  . albuterol (PROVENTIL HFA;VENTOLIN HFA) 108 (90 Base) MCG/ACT inhaler Inhale 2 puffs into the lungs every 6 (six) hours as needed for wheezing or shortness of breath. 1 Inhaler 3  . aspirin 81 MG tablet Take 81 mg by mouth daily.    . montelukast (SINGULAIR) 10 MG tablet TAKE 1 TABLET (10 MG TOTAL) BY MOUTH AT BEDTIME. MUST KEEP JAN APP FOR MORE 90 tablet 1   No current facility-administered medications for this visit.     Allergies: Pravastatin  Past Medical History:  Diagnosis Date  . Asthma   . Elevated liver enzymes    PMH of  on Pravachol  . Hyperglycemia    A1c 5.2% in 2010  . Hyperlipidemia     Past Surgical History:  Procedure Laterality Date  . APPENDECTOMY    . TONSILLECTOMY    . VASECTOMY    . WISDOM TOOTH EXTRACTION      Family History  Problem Relation Age of Onset  . Heart attack Father        pre 72  . Diabetes Sister        DM 1  . Heart attack Paternal Grandfather         in 45s  . Gout Paternal Grandfather   . Cancer Paternal Uncle 41       bone   . Mental illness Maternal Grandmother        "breakdown"  . Healthy Mother     Social History   Tobacco Use  . Smoking status: Never Smoker  . Smokeless tobacco: Never Used  Substance Use Topics  . Alcohol use: Yes    Alcohol/week: 21.0 - 28.0 standard drinks    Types: 21 - 28 Cans of beer per week    Subjective:  Patient presents with concerns for right calf pain  x 2 days; mentions that pain feels like a "charlie horse" but patient is adamant that he has a DVT; is adamant that he needs to get a doppler done today; mentions that he has had problems with chest pain on and off for the past year- EKG done in August 2018 was normal; patient notes he would like to see a cardiologist for a stress test as well;     Objective:  Vitals:   01/09/18 1330  BP: 140/90  Pulse: 87  Temp: 98.1 F (36.7 C)  TempSrc: Oral  SpO2: 96%  Weight: 177 lb 0.6 oz (80.3 kg)  Height: 6\' 1"  (1.854 m)    General: Well developed, well nourished, in no acute distress  Skin : Warm and dry.  Head: Normocephalic and atraumatic  Lungs: Respirations unlabored;  Musculoskeletal: No deformities; no active joint inflammation  Extremities: No edema, cyanosis, clubbing  Vessels: Symmetric bilaterally  Neurologic: Alert and oriented; speech intact; face symmetrical; moves all extremities well; CNII-XII intact without focal deficit   Assessment:  1. Right calf pain   2. Atypical chest pain     Plan:  1. Very low suspicion for DVT but patient is obviously anxious about diagnosis; is adamant that doppler needs to be done today; order updated; follow-up to be determined; 2. Not new symptoms- EKG done in 2018; refer for stress test per patient request.   No follow-ups on file.  Orders Placed This Encounter  Procedures  . Ambulatory referral to Cardiology    Referral Priority:   Routine    Referral Type:   Consultation    Referral Reason:   Specialty Services Required    Requested Specialty:   Cardiology    Number of Visits Requested:   1    Requested Prescriptions    No prescriptions requested or ordered in this encounter

## 2018-01-10 ENCOUNTER — Ambulatory Visit (HOSPITAL_COMMUNITY)
Admission: RE | Admit: 2018-01-10 | Discharge: 2018-01-10 | Disposition: A | Discharge status: Home / Self Care | Payer: 59 | Source: Ambulatory Visit | Attending: Family | Admitting: Family

## 2018-01-10 DIAGNOSIS — M79661 Pain in right lower leg: Secondary | ICD-10-CM | POA: Diagnosis not present

## 2018-01-10 NOTE — Progress Notes (Signed)
*  Preliminary Results* Right lower extremity venous duplex completed. Right lower extremity is negative for deep vein thrombosis. There is no evidence of right Baker's cyst.  01/10/2018 9:12 AM  Jinny Blossom Dawna Part

## 2018-01-24 NOTE — Progress Notes (Signed)
Cardiology Office Note:    Date:  01/25/2018   ID:  Tim Strickland, DOB September 22, 1965, MRN [id number]  PCP:  Lance Sell, NP  Cardiologist:  Shirlee More, MD   Referring MD: Marrian Salvage,*  ASSESSMENT:    1. Angina pectoris ([place])   2. Familial hyperlipidemia   3. Shoulder impingement syndrome, right    PLAN:    In order of problems listed above:  1. He has typical exertional angina and has increased cardiovascular risk with his familial hyperlipidemia he has started taking low-dose aspirin will continue placed him on a statin initiated antihypertensive therapy.  He also has a prescription for nitroglycerin if needed after discussion of options for further evaluation cardiac CTA be performed and will try to expedite. 2. Resume statin therapy high intensity statin he thinks he was a nonresponder in the past but that is true you require PCSK9 plus other medical therapy such as Zetia 3. Does atypical discomfort in his left arm likely musculoskeletal or radicular in nature  Next appointment   Medication Adjustments/Labs and Tests Ordered: Current medicines are reviewed at length with the patient today.  Concerns regarding medicines are outlined above.  Orders Placed This Encounter  Procedures  . CT CORONARY MORPH W/CTA COR W/SCORE W/CA W/CM &/OR WO/CM  . CT CORONARY FRACTIONAL FLOW RESERVE DATA PREP  . CT CORONARY FRACTIONAL FLOW RESERVE FLUID ANALYSIS  . Lipid panel  . Lipoprotein A (LPA)  . Comprehensive metabolic panel  . EKG 12-Lead   Meds ordered this encounter  Medications  . DISCONTD: metoprolol tartrate (LOPRESSOR) 50 MG tablet    Sig: Take 1 tablet (50 mg total) by mouth once for 1 dose. Take one tablet one hour prior to Cardiac CTA    Dispense:  1 tablet    Refill:  0  . telmisartan (MICARDIS) 20 MG tablet    Sig: Take 1 tablet (20 mg total) by mouth daily.    Dispense:  30 tablet    Refill:  1  . rosuvastatin (CRESTOR) 20 MG tablet    Sig:  Take 1 tablet (20 mg total) by mouth daily.    Dispense:  30 tablet    Refill:  1  . nitroGLYCERIN (NITROSTAT) 0.4 MG SL tablet    Sig: Place 1 tablet (0.4 mg total) under the tongue every 5 (five) minutes as needed for chest pain.    Dispense:  25 tablet    Refill:  11  . metoprolol tartrate (LOPRESSOR) 50 MG tablet    Sig: Take 2 tablets (100 mg) by mouth once 2 hours before cardiac CTA.    Dispense:  2 tablet    Refill:  0     Chief Complaint  Patient presents with  . Chest Pain  Chest pain with activity  History of Present Illness:    Tim Strickland is a 52 y.o. male who is being seen today for the evaluation of chest pain at the request of Marrian Salvage,*. Lower extremity venous duplex was normal 01/10/18. 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  He has a family history of CAD as well as severe dyslipidemia consistent with  familial hyperlipidemia.  He is tried statins in the past and he was a nonresponder.  No known congenital rheumatic heart disease.  So has had atypical discomfort in the left wrist and left upper extremity with throbbing different from exertional angina and has a history of neurogenic nerve impingement syndrome in the right upper extremity non-smoker he said no shortness of breath palpitation or syncope.  Pain is moderate in nature not severe exertional relieved with rest not pleuritic no radiation to the arm or jaw no associated diaphoresis or nausea Past Medical History:  Diagnosis Date  . Asthma   . Elevated liver enzymes    PMH of  on Pravachol  . Hyperglycemia    A1c 5.2% in 2010  . Hyperlipidemia     Past  Surgical History:  Procedure Laterality Date  . APPENDECTOMY    . TONSILLECTOMY    . VASECTOMY    . WISDOM TOOTH EXTRACTION      Current Medications: Current Meds  Medication Sig  . albuterol (PROVENTIL HFA;VENTOLIN HFA) 108 (90 Base) MCG/ACT inhaler Inhale 2 puffs into the lungs every 6 (six) hours as needed for wheezing or shortness of breath.  Marland Kitchen aspirin 81 MG tablet Take 81 mg by mouth daily.  . montelukast (SINGULAIR) 10 MG tablet TAKE 1 TABLET (10 MG TOTAL) BY MOUTH AT BEDTIME. MUST KEEP JAN APP FOR MORE     Allergies:   Pravastatin   Social History   Socioeconomic History  . Marital status: Married    Spouse name: Not on file  . Number of children: 2  . Years of education: 22  . Highest education level: Not on file  Occupational History  . Occupation: Programme researcher, broadcasting/film/video  Social Needs  . Financial resource strain: Not on file  . Food insecurity:    Worry: Not on file    Inability: Not on file  . Transportation needs:    Medical: Not on file    Non-medical: Not on file  Tobacco Use  . Smoking status: Never Smoker  . Smokeless tobacco: Never Used  Substance and Sexual Activity  . Alcohol use: Yes    Alcohol/week: 2.0 - 3.0 standard drinks    Types: 2 - 3 Cans of beer per week    Comment: 2-3 beers daily  . Drug use: No  . Sexual activity: Not on file  Lifestyle  . Physical activity:    Days per week: Not on file    Minutes per session: Not on file  . Stress: Not on file  Relationships  . Social connections:    Talks on phone: Not on file    Gets together: Not on file    Attends religious service: Not on file    Active member of club or organization: Not on file    Attends meetings of clubs or organizations: Not on file    Relationship status: Not on file  Other Topics Concern  . Not on file  Social History Narrative  . Not on file     Family History: The patient's family history includes Autoimmune disease in his mother; Cancer (age of onset: 25) in his  paternal uncle; Diabetes in his sister; Gout in his paternal grandfather; Healthy in his mother; Heart attack in his father and paternal grandfather; Mental illness in his maternal grandmother; Stroke in his paternal grandmother.  ROS:   Review of Systems  Constitution: Negative.  HENT: Negative.   Eyes: Negative.   Cardiovascular: Positive for chest pain (exertinal tightness).  Respiratory: Positive for shortness of breath.   Endocrine: Negative.   Hematologic/Lymphatic: Negative.   Skin: Negative.   Musculoskeletal: Positive for joint pain.  Gastrointestinal: Negative.   Genitourinary: Negative.   Neurological: Negative.   Psychiatric/Behavioral: Negative.   Allergic/Immunologic: Negative.    Please see the history of present illness.     All other systems reviewed and are negative.  EKGs/Labs/Other Studies Reviewed:    The following studies were reviewed today:   EKG:  EKG is  ordered today.  The ekg ordered today demonstrates on his rhythm and is normal  Recent Labs: No results found for requested labs within last 8760 hours.  Recent Lipid Panel    Component Value Date/Time   CHOL 223 (H) 11/02/2016 0829   CHOL 178 01/15/2014 0801   TRIG 69.0 11/02/2016 0829   TRIG 233 (H) 01/15/2014 0801   HDL 56.20 11/02/2016 0829   HDL 62 01/15/2014 0801   CHOLHDL 4 11/02/2016 0829   VLDL 13.8 11/02/2016 0829   LDLCALC 153 (H) 11/02/2016 0829   LDLCALC 69 01/15/2014 0801   LDLDIRECT 205.1 12/26/2012 0938    Physical Exam:    VS:  BP (!) 146/102 (BP Location: Left Arm, Patient Position: Sitting, Cuff Size: Normal)   Pulse 71   Ht 6\' 1"  (1.854 m)   Wt 177 lb 12.8 oz (80.6 kg)   SpO2 97%   BMI 23.46 kg/m     Wt Readings from Last 3 Encounters:  01/25/18 177 lb 12.8 oz (80.6 kg)  01/09/18 177 lb 0.6 oz (80.3 kg)  11/24/17 181 lb (82.1 kg)     GEN: No xanthoma or xanthelasma well nourished, well developed in no acute distress HEENT: Normal NECK: No JVD; No carotid  bruits LYMPHATICS: No lymphadenopathy CARDIAC: Thoracic scoliosis and pectus excavatum deformity RRR, no murmurs, rubs, gallops RESPIRATORY:  Clear to auscultation without rales, wheezing or rhonchi  ABDOMEN: Soft, non-tender, non-distended MUSCULOSKELETAL:  No edema; No deformity  SKIN: Warm and dry NEUROLOGIC:  Alert and oriented x 3 PSYCHIATRIC:  Normal affect     Signed, Shirlee More, MD  01/25/2018 10:06 AM    [place]

## 2018-01-25 ENCOUNTER — Ambulatory Visit: Payer: 59 | Admitting: Cardiology

## 2018-01-25 ENCOUNTER — Encounter: Payer: Self-pay | Admitting: Cardiology

## 2018-01-25 VITALS — BP 146/102 | HR 71 | Ht 73.0 in | Wt 177.8 lb

## 2018-01-25 DIAGNOSIS — E7849 Other hyperlipidemia: Secondary | ICD-10-CM

## 2018-01-25 DIAGNOSIS — I2510 Atherosclerotic heart disease of native coronary artery without angina pectoris: Secondary | ICD-10-CM | POA: Insufficient documentation

## 2018-01-25 DIAGNOSIS — M7541 Impingement syndrome of right shoulder: Secondary | ICD-10-CM

## 2018-01-25 DIAGNOSIS — I209 Angina pectoris, unspecified: Secondary | ICD-10-CM

## 2018-01-25 HISTORY — DX: Atherosclerotic heart disease of native coronary artery without angina pectoris: I25.10

## 2018-01-25 MED ORDER — METOPROLOL TARTRATE 50 MG PO TABS: ORAL_TABLET | ORAL | 0 refills | Status: DC

## 2018-01-25 MED ORDER — METOPROLOL TARTRATE 50 MG PO TABS: 50.0000 mg | ORAL_TABLET | Freq: Once | ORAL | 0 refills | Status: DC

## 2018-01-25 MED ORDER — ROSUVASTATIN CALCIUM 20 MG PO TABS: 20.0000 mg | ORAL_TABLET | Freq: Every day | ORAL | 1 refills | Status: DC

## 2018-01-25 MED ORDER — NITROGLYCERIN 0.4 MG SL SUBL: 0.4000 mg | SUBLINGUAL_TABLET | SUBLINGUAL | 11 refills | Status: DC | PRN

## 2018-01-25 MED ORDER — TELMISARTAN 20 MG PO TABS: 20.0000 mg | ORAL_TABLET | Freq: Every day | ORAL | 1 refills | Status: DC

## 2018-01-25 NOTE — Patient Instructions (Addendum)
Medication Instructions:  Your physician has recommended you make the following change in your medication:  START TELMISARTIN 20 MG ONCE A DAY START ROSUVASTATIN 20 MG ONCE A DAY PICK UP NITROGLYCERINE TO USE AS NEEDED FOR CHEST PAIN: When having chest pain, stop what you are doing and sit down. Take 1 nitro, wait 5 minutes. Still having chest pain, take 1 nitro, wait 5 minutes. Still having chest pain, take 1 nitro, dial 911. Total of 3 nitro in 15 minutes.  If you need a refill on your cardiac medications before your next appointment, please call your pharmacy.   Lab work: Your physician recommends that you return for lab work in: Recommend that you have Lipids, LPa and CMP.  If you have labs (blood work) drawn today and your tests are completely normal, you will receive your results only by: Marland Kitchen MyChart Message (if you have MyChart) OR . A paper copy in the mail If you have any lab test that is abnormal or we need to change your treatment, we will call you to review the results.  Testing/Procedures: Your physician has requested that you have cardiac CT. Cardiac computed tomography (CT) is a painless test that uses an x-ray machine to take clear, detailed pictures of your heart. For further information please visit [email or web address]. Please follow instruction sheet as given.  Please arrive at the [facility] main entrance of [facility] at xx:xx AM (30-45 minutes prior to test start time)  [facility] [place], [place] [address] [phone]  Proceed to the [facility] Radiology Department (First Floor).  Please follow these instructions carefully (unless otherwise directed):  Hold all erectile dysfunction medications at least 48 hours prior to test.  On the Night Before the Test: . Be sure to Drink plenty of water. . Do not consume any caffeinated/decaffeinated beverages or chocolate 12 hours prior to your test. . Do not take any antihistamines  12 hours prior to your test.  On the Day of the Test: . Drink plenty of water. Do not drink any water within one hour of the test. . Do not eat any food 4 hours prior to the test. . You may take your regular medications prior to the test.  . Take metoprolol (Lopressor) two hours prior to test.   *For Clinical Staff only. Please instruct patient the following:*        -Drink plenty of water       -Hold Furosemide/hydrochlorothiazide morning of the test       -Take metoprolol (Lopressor) 2 hours prior to test (if applicable).                  -If HR is less than 55 BPM- No Beta Blocker                -IF HR is greater than 55 BPM and patient is less than or equal to 66 yrs old Lopressor 100mg  x1.         After the Test: . Drink plenty of water. . After receiving IV contrast, you may experience a mild flushed feeling. This is normal. . On occasion, you may experience a mild rash up to 24 hours after the test. This is not dangerous. If this occurs, you can take Benadryl 25 mg and increase your fluid intake. . If you experience trouble breathing, this can be serious. If it is severe call 911 IMMEDIATELY. If it is mild, please call our office.  Follow-Up: At [facility], you and your health needs are our priority.  As part of our continuing mission to provide you with exceptional heart care, we have created designated Provider Care Teams.  These Care Teams include your primary Cardiologist (physician) and Advanced Practice Providers (APPs -  Physician Assistants and Nurse Practitioners) who all work together to provide you with the care you need, when you need it. You will need a follow up appointment in 6 weeks.     Telmisartan tablets What is this medicine? TELMISARTAN (tel mi SAR tan) is used to treat high blood pressure. This medicine may be used for other purposes; ask your health care provider or pharmacist if you have questions. COMMON BRAND NAME(S): Micardis What should  I tell my health care provider before I take this medicine? They need to know if you have any of these conditions: -if you are on a special diet, such as a low-salt diet -kidney or liver disease -an unusual or allergic reaction to telmisartan, other medicines, foods, dyes, or preservatives -pregnant or trying to get pregnant -breast-feeding How should I use this medicine? Take this medicine by mouth with a glass of water. Follow the directions on the prescription label. This medicine can be taken with or without food. Take your doses at regular intervals. Do not take your medicine more often than directed. Talk to your pediatrician regarding the use of this medicine in children. Special care may be needed. Overdosage: If you think you have taken too much of this medicine contact a poison control center or emergency room at once. NOTE: This medicine is only for you. Do not share this medicine with others. What if I miss a dose? If you miss a dose, take it as soon as you can. If it is almost time for your next dose, take only that dose. Do not take double or extra doses. What may interact with this medicine? -digoxin -potassium salts or potassium supplements -warfarin This list may not describe all possible interactions. Give your health care provider a list of all the medicines, herbs, non-prescription drugs, or dietary supplements you use. Also tell them if you smoke, drink alcohol, or use illegal drugs. Some items may interact with your medicine. What should I watch for while using this medicine? Visit your doctor or health care professional for regular checks on your progress. Check your blood pressure as directed. Ask your doctor or health care professional what your blood pressure should be and when you should contact him or her. Call your doctor or health care professional if you notice an irregular or fast heart beat. Women should inform their doctor if they wish to become pregnant or  think they might be pregnant. There is a potential for serious side effects to an unborn child, particularly in the second or third trimester. Talk to your health care professional or pharmacist for more information. You may get drowsy or dizzy. Do not drive, use machinery, or do anything that needs mental alertness until you know how this drug affects you. Do not stand or sit up quickly, especially if you are an older patient. This reduces the risk of dizzy or fainting spells. Alcohol can make you more drowsy and dizzy. Avoid alcoholic drinks. Avoid salt substitutes unless you are told otherwise by your doctor or health care professional. Do not treat yourself for coughs, colds, or pain while you are taking this medicine without asking your doctor or health care professional for advice. Some ingredients may increase  your blood pressure. What side effects may I notice from receiving this medicine? Side effects that you should report to your doctor or health care professional as soon as possible: -allergic reactions like skin rash, itching or hives, swelling of the face, lips, or tongue -breathing problems -dark urine -gout pain -muscle pains -slow heartbeat -trouble passing urine or change in the amount of urine -unusual bleeding or bruising -yellowing of the eyes or skin Side effects that usually do not require medical attention (report to your doctor or health care professional if they continue or are bothersome): -back pain -change in sex drive or performance -diarrhea -sore throat or stuffy nose This list may not describe all possible side effects. Call your doctor for medical advice about side effects. You may report side effects to FDA at 1-800-FDA-1088. Where should I keep my medicine? Keep out of the reach of children. Store at room temperature between 15 and 30 degrees C (59 and 86 degrees F). Tablets should not be removed from the blisters until right before use. Throw away any  unused medicine after the expiration date. NOTE: This sheet is a summary. It may not cover all possible information. If you have questions about this medicine, talk to your doctor, pharmacist, or health care provider.  2018 Elsevier/Gold Standard (2007-05-09 13:39:10)    Rosuvastatin Tablets What is this medicine? ROSUVASTATIN (roe SOO va sta tin) is known as a HMG-CoA reductase inhibitor or 'statin'. It lowers cholesterol and triglycerides in the blood. This drug may also reduce the risk of heart attack, stroke, or other health problems in patients with risk factors for heart disease. Diet and lifestyle changes are often used with this drug. This medicine may be used for other purposes; ask your health care provider or pharmacist if you have questions. COMMON BRAND NAME(S): Crestor What should I tell my health care provider before I take this medicine? They need to know if you have any of these conditions: -frequently drink alcoholic beverages -kidney disease -liver disease -muscle aches or weakness -other medical condition -an unusual or allergic reaction to rosuvastatin, other medicines, foods, dyes, or preservatives -pregnant or trying to get pregnant -breast-feeding How should I use this medicine? Take this medicine by mouth with a glass of water. Follow the directions on the prescription label. Do not cut, crush or chew this medicine. You can take this medicine with or without food. Take your doses at regular intervals. Do not take your medicine more often than directed. Talk to your pediatrician regarding the use of this medicine in children. While this drug may be prescribed for children as young as 36 years old for selected conditions, precautions do apply. Overdosage: If you think you have taken too much of this medicine contact a poison control center or emergency room at once. NOTE: This medicine is only for you. Do not share this medicine with others. What if I miss a dose? If  you miss a dose, take it as soon as you can. Do not take 2 doses within 12 hours of each other. If there are less than 12 hours until your next dose, take only that dose. Do not take double or extra doses. What may interact with this medicine? Do not take this medicine with any of the following medications: -herbal medicines like red yeast rice This medicine may also interact with the following medications: -alcohol -antacids containing aluminum hydroxide or magnesium hydroxide -cyclosporine -other medicines for high cholesterol -some medicines for HIV infection -warfarin This list  may not describe all possible interactions. Give your health care provider a list of all the medicines, herbs, non-prescription drugs, or dietary supplements you use. Also tell them if you smoke, drink alcohol, or use illegal drugs. Some items may interact with your medicine. What should I watch for while using this medicine? Visit your doctor or health care professional for regular check-ups. You may need regular tests to make sure your liver is working properly. Tell your doctor or health care professional right away if you get any unexplained muscle pain, tenderness, or weakness, especially if you also have a fever and tiredness. Your doctor or health care professional may tell you to stop taking this medicine if you develop muscle problems. If your muscle problems do not go away after stopping this medicine, contact your health care professional. This medicine may affect blood sugar levels. If you have diabetes, check with your doctor or health care professional before you change your diet or the dose of your diabetic medicine. Avoid taking antacids containing aluminum, calcium or magnesium within 2 hours of taking this medicine. This drug is only part of a total heart-health program. Your doctor or a dietician can suggest a low-cholesterol and low-fat diet to help. Avoid alcohol and smoking, and keep a proper  exercise schedule. Do not use this drug if you are pregnant or breast-feeding. Serious side effects to an unborn child or to an infant are possible. Talk to your doctor or pharmacist for more information. What side effects may I notice from receiving this medicine? Side effects that you should report to your doctor or health care professional as soon as possible: -allergic reactions like skin rash, itching or hives, swelling of the face, lips, or tongue -dark urine -fever -joint pain -muscle cramps, pain -redness, blistering, peeling or loosening of the skin, including inside the mouth -trouble passing urine or change in the amount of urine -unusually weak or tired -yellowing of the eyes or skin Side effects that usually do not require medical attention (report to your doctor or health care professional if they continue or are bothersome): -constipation -heartburn -nausea -stomach gas, pain, upset This list may not describe all possible side effects. Call your doctor for medical advice about side effects. You may report side effects to FDA at 1-800-FDA-1088. Where should I keep my medicine? Keep out of the reach of children. Store at room temperature between 20 and 25 degrees C (68 and 77 degrees F). Keep container tightly closed (protect from moisture). Throw away any unused medicine after the expiration date. NOTE: This sheet is a summary. It may not cover all possible information. If you have questions about this medicine, talk to your doctor, pharmacist, or health care provider.  2018 Elsevier/Gold Standard (2014-08-07 13:33:08)    Nitroglycerin sublingual tablets What is this medicine? NITROGLYCERIN (nye troe GLI ser in) is a type of vasodilator. It relaxes blood vessels, increasing the blood and oxygen supply to your heart. This medicine is used to relieve chest pain caused by angina. It is also used to prevent chest pain before activities like climbing stairs, going outdoors in  cold weather, or sexual activity. This medicine may be used for other purposes; ask your health care provider or pharmacist if you have questions. COMMON BRAND NAME(S): Nitroquick, Nitrostat, Nitrotab What should I tell my health care provider before I take this medicine? They need to know if you have any of these conditions: -anemia -head injury, recent stroke, or bleeding in the brain -liver disease -  previous heart attack -an unusual or allergic reaction to nitroglycerin, other medicines, foods, dyes, or preservatives -pregnant or trying to get pregnant -breast-feeding How should I use this medicine? Take this medicine by mouth as needed. At the first sign of an angina attack (chest pain or tightness) place one tablet under your tongue. You can also take this medicine 5 to 10 minutes before an event likely to produce chest pain. Follow the directions on the prescription label. Let the tablet dissolve under the tongue. Do not swallow whole. Replace the dose if you accidentally swallow it. It will help if your mouth is not dry. Saliva around the tablet will help it to dissolve more quickly. Do not eat or drink, smoke or chew tobacco while a tablet is dissolving. If you are not better within 5 minutes after taking ONE dose of nitroglycerin, call 9-1-1 immediately to seek emergency medical care. Do not take more than 3 nitroglycerin tablets over 15 minutes. If you take this medicine often to relieve symptoms of angina, your doctor or health care professional may provide you with different instructions to manage your symptoms. If symptoms do not go away after following these instructions, it is important to call 9-1-1 immediately. Do not take more than 3 nitroglycerin tablets over 15 minutes. Talk to your pediatrician regarding the use of this medicine in children. Special care may be needed. Overdosage: If you think you have taken too much of this medicine contact a poison control center or emergency  room at once. NOTE: This medicine is only for you. Do not share this medicine with others. What if I miss a dose? This does not apply. This medicine is only used as needed. What may interact with this medicine? Do not take this medicine with any of the following medications: -certain migraine medicines like ergotamine and dihydroergotamine (DHE) -medicines used to treat erectile dysfunction like sildenafil, tadalafil, and vardenafil -riociguat This medicine may also interact with the following medications: -alteplase -aspirin -heparin -medicines for high blood pressure -medicines for mental depression -other medicines used to treat angina -phenothiazines like chlorpromazine, mesoridazine, prochlorperazine, thioridazine This list may not describe all possible interactions. Give your health care provider a list of all the medicines, herbs, non-prescription drugs, or dietary supplements you use. Also tell them if you smoke, drink alcohol, or use illegal drugs. Some items may interact with your medicine. What should I watch for while using this medicine? Tell your doctor or health care professional if you feel your medicine is no longer working. Keep this medicine with you at all times. Sit or lie down when you take your medicine to prevent falling if you feel dizzy or faint after using it. Try to remain calm. This will help you to feel better faster. If you feel dizzy, take several deep breaths and lie down with your feet propped up, or bend forward with your head resting between your knees. You may get drowsy or dizzy. Do not drive, use machinery, or do anything that needs mental alertness until you know how this drug affects you. Do not stand or sit up quickly, especially if you are an older patient. This reduces the risk of dizzy or fainting spells. Alcohol can make you more drowsy and dizzy. Avoid alcoholic drinks. Do not treat yourself for coughs, colds, or pain while you are taking this  medicine without asking your doctor or health care professional for advice. Some ingredients may increase your blood pressure. What side effects may I notice from receiving  this medicine? Side effects that you should report to your doctor or health care professional as soon as possible: -blurred vision -dry mouth -skin rash -sweating -the feeling of extreme pressure in the head -unusually weak or tired Side effects that usually do not require medical attention (report to your doctor or health care professional if they continue or are bothersome): -flushing of the face or neck -headache -irregular heartbeat, palpitations -nausea, vomiting This list may not describe all possible side effects. Call your doctor for medical advice about side effects. You may report side effects to FDA at 1-800-FDA-1088. Where should I keep my medicine? Keep out of the reach of children. Store at room temperature between 20 and 25 degrees C (68 and 77 degrees F). Store in [facility] [employer]. Protect from light and moisture. Keep tightly closed. Throw away any unused medicine after the expiration date. NOTE: This sheet is a summary. It may not cover all possible information. If you have questions about this medicine, talk to your doctor, pharmacist, or health care provider.  2018 Elsevier/Gold Standard (2012-12-20 17:57:36)

## 2018-01-26 LAB — COMPREHENSIVE METABOLIC PANEL
A/G RATIO: 1.9 (ref 1.2–2.2)
ALBUMIN: 4.8 g/dL (ref 3.5–5.5)
ALT: 36 IU/L (ref 0–44)
AST: 27 IU/L (ref 0–40)
Alkaline Phosphatase: 72 IU/L (ref 39–117)
BUN/Creatinine Ratio: 12 (ref 9–20)
BUN: 11 mg/dL (ref 6–24)
Bilirubin Total: 0.5 mg/dL (ref 0.0–1.2)
CALCIUM: 9.8 mg/dL (ref 8.7–10.2)
CO2: 22 mmol/L (ref 20–29)
Chloride: 98 mmol/L (ref 96–106)
Creatinine, Ser: 0.9 mg/dL (ref 0.76–1.27)
GFR, EST AFRICAN AMERICAN: 114 mL/min/{1.73_m2} (ref >59)
GFR, EST NON AFRICAN AMERICAN: 99 mL/min/{1.73_m2} (ref >59)
GLOBULIN, TOTAL: 2.5 g/dL (ref 1.5–4.5)
Glucose: 103 mg/dL — ABNORMAL HIGH (ref 65–99)
POTASSIUM: 4.9 mmol/L (ref 3.5–5.2)
SODIUM: 139 mmol/L (ref 134–144)
TOTAL PROTEIN: 7.3 g/dL (ref 6.0–8.5)

## 2018-01-26 LAB — LIPID PANEL
CHOL/HDL RATIO: 3.5 ratio (ref 0.0–5.0)
CHOLESTEROL TOTAL: 239 mg/dL — AB (ref 100–199)
HDL: 68 mg/dL (ref >39)
LDL Calculated: 154 mg/dL — ABNORMAL HIGH (ref 0–99)
TRIGLYCERIDES: 87 mg/dL (ref 0–149)
VLDL Cholesterol Cal: 17 mg/dL (ref 5–40)

## 2018-01-26 LAB — LIPOPROTEIN A (LPA): Lipoprotein (a): 11.8 nmol/L (ref <75.0)

## 2018-02-19 ENCOUNTER — Telehealth: Payer: Self-pay | Admitting: *Deleted

## 2018-02-19 MED ORDER — TELMISARTAN 20 MG PO TABS: 20.0000 mg | ORAL_TABLET | Freq: Every day | ORAL | 0 refills | Status: DC

## 2018-02-19 MED ORDER — ROSUVASTATIN CALCIUM 20 MG PO TABS: 20.0000 mg | ORAL_TABLET | Freq: Every day | ORAL | 0 refills | Status: DC

## 2018-02-19 NOTE — Telephone Encounter (Signed)
*  STAT* If patient is at the pharmacy, call can be transferred to refill team.   1. Which medications need to be refilled? (please list name of each medication and dose if known) Telmisartan 20 mg qd Rosuvastatin Calcium 20 mg qd  2. Which pharmacy/location (including street and city if local pharmacy) is medication to be sent to?[facility] on [address]   3. Do they need a 30 day or 90 day supply? [place]

## 2018-03-08 ENCOUNTER — Telehealth (HOSPITAL_COMMUNITY): Payer: Self-pay | Admitting: Emergency Medicine

## 2018-03-08 NOTE — Telephone Encounter (Signed)
Reaching out to patient to offer assistance regarding upcoming cardiac imaging study; pt verbalizes understanding of appt date/time, parking situation and where to check in, pre-test NPO status and medications ordered, and verified current allergies; name and call back number provided for further questions should they arise Johnney Scarlata RN Navigator Cardiac Imaging [phone] 

## 2018-03-09 ENCOUNTER — Ambulatory Visit: Payer: 59 | Admitting: Cardiology

## 2018-03-12 ENCOUNTER — Ambulatory Visit (HOSPITAL_COMMUNITY)
Admission: RE | Admit: 2018-03-12 | Discharge: 2018-03-12 | Disposition: A | Discharge status: Home / Self Care | Payer: 59 | Source: Ambulatory Visit | Attending: Cardiology | Admitting: Cardiology

## 2018-03-12 DIAGNOSIS — I209 Angina pectoris, unspecified: Secondary | ICD-10-CM | POA: Diagnosis present

## 2018-03-12 MED ORDER — NITROGLYCERIN 0.4 MG SL SUBL
0.8000 mg | SUBLINGUAL_TABLET | Freq: Once | SUBLINGUAL | Status: AC
  Administered 2018-03-12: 0.8 mg via SUBLINGUAL
  Filled 2018-03-12: qty 25

## 2018-03-12 MED ORDER — IOPAMIDOL (ISOVUE-370) INJECTION 76%
80.0000 mL | Freq: Once | INTRAVENOUS | Status: AC | PRN
  Administered 2018-03-12: 100 mL via INTRAVENOUS

## 2018-03-12 MED ORDER — NITROGLYCERIN 0.4 MG SL SUBL
SUBLINGUAL_TABLET | SUBLINGUAL | Status: AC
  Filled 2018-03-12: qty 2

## 2018-03-12 NOTE — Progress Notes (Signed)
Patient reports of feeling better after snack and beverage. Denies headache.

## 2018-03-12 NOTE — Progress Notes (Signed)
PAtient ambulatory out of department with steady gait noted. Denies any complaints. Patient informed to seek medical attention if he felt worse and had questions. Patient verbalizes understanding.

## 2018-03-12 NOTE — Progress Notes (Signed)
Patient ambulated down hallway and denies any light headedness, dizziness or headache.

## 2018-03-12 NOTE — Progress Notes (Signed)
CT complete. Patient reports of lightheaded and dizziness. Patient give crackers , water and coke. Will continue to assess.

## 2018-03-14 NOTE — H&P (View-Only) (Signed)
Cardiology Office Note:    Date:  03/15/2018   ID:  Tim Strickland, DOB 08-02-1965, MRN [id number]  PCP:  Lance Sell, NP  Cardiologist:  Shirlee More, MD    Referring MD: Lance Sell, NP    ASSESSMENT:    1. Coronary artery disease of native artery of native heart with stable angina pectoris ([place])   2. Angina pectoris ([place])   3. Pre-op evaluation   4. Familial hyperlipidemia    PLAN:    In order of problems listed above:  1. Abnormal CTA and concern he is high risk anatomy he is symptomatic and referred for coronary angiography and revascularization if appropriate 2. He is having anginal equivalent severe limiting exertional dyspnea 3. Poorly controlled hyperlipidemia statin intolerant will start PCSK9 therapy and a minimum dose of a statin as background.  If another agent is needed Zetia would be appropriate   Next appointment: 1 month   Medication Adjustments/Labs and Tests Ordered: Current medicines are reviewed at length with the patient today.  Concerns regarding medicines are outlined above.  Orders Placed This Encounter  Procedures  . CBC  . Basic Metabolic Panel (BMET)  . AMB Referral to [facility]  . EKG 12-Lead   Meds ordered this encounter  Medications  . Evolocumab (REPATHA SURECLICK) 637 MG/ML SOAJ    Sig: Inject 140 mg into the skin every 14 (fourteen) days.  . Pitavastatin Calcium (LIVALO) 2 MG TABS    Sig: Take 1 tablet (2 mg total) by mouth daily.    Dispense:  30 tablet    Refill:  3    No chief complaint on file.   History of Present Illness:    Tim Strickland is a 53 y.o. male with a hx of angina and abnormal cardiac CTA last seen 01/25/18. Compliance with diet, lifestyle and medications: Yes  Is seen by me after his cardiac CTA he has severe flow-limiting sequential LAD stenosis as well as distal right coronary.  I reviewed the findings with patient he is a high healthcare literacy he understands  the findings and tells me that he is not feeling as well as he wants when he tries to do activities with his family walking fast or walking uphill he developed severe limiting shortness of breath anginal equivalent and the symptoms have progressed.  We discussed treatment options with additional antianginal medications or referral for coronary angiography and intervention if appropriate options benefits and risks detailed told the patient in my opinion he may have high risk anatomy and gave him a recommendation undergo coronary angiography he agrees.  Informed consent obtained and will be performed tomorrow and he is aware that the decision for revascularization will depend on angiography.  He has no contraindication to dual antiplatelet therapy or renal insufficiency or dye allergy.  He also has severe hyperlipidemia very poorly statin tolerant he had to discontinue Crestor.  We will start PCSK9 and place him on a minimal dose of Livalo as background statin therapy.  His LP(a) is normal. Past Medical History:  Diagnosis Date  . Asthma   . Elevated liver enzymes    PMH of  on Pravachol  . Hyperglycemia    A1c 5.2% in 2010  . Hyperlipidemia     Past Surgical History:  Procedure Laterality Date  . APPENDECTOMY    . TONSILLECTOMY    . VASECTOMY    . WISDOM TOOTH EXTRACTION      Current Medications: Current Meds  Medication Sig  . albuterol (PROVENTIL HFA;VENTOLIN HFA) 108 (90 Base) MCG/ACT inhaler Inhale 2 puffs into the lungs every 6 (six) hours as needed for wheezing or shortness of breath.  Marland Kitchen aspirin 81 MG tablet Take 81 mg by mouth daily.  . montelukast (SINGULAIR) 10 MG tablet TAKE 1 TABLET (10 MG TOTAL) BY MOUTH AT BEDTIME. MUST KEEP JAN APP FOR MORE  . nitroGLYCERIN (NITROSTAT) 0.4 MG SL tablet Place 1 tablet (0.4 mg total) under the tongue every 5 (five) minutes as needed for chest pain.  Marland Kitchen telmisartan (MICARDIS) 20 MG tablet Take 1 tablet (20 mg total) by mouth daily.      Allergies:   Pravastatin   Social History   Socioeconomic History  . Marital status: Married    Spouse name: Not on file  . Number of children: 2  . Years of education: 19  . Highest education level: Not on file  Occupational History  . Occupation: Programme researcher, broadcasting/film/video  Social Needs  . Financial resource strain: Not on file  . Food insecurity:    Worry: Not on file    Inability: Not on file  . Transportation needs:    Medical: Not on file    Non-medical: Not on file  Tobacco Use  . Smoking status: Never Smoker  . Smokeless tobacco: Never Used  Substance and Sexual Activity  . Alcohol use: Yes    Alcohol/week: 2.0 - 3.0 standard drinks    Types: 2 - 3 Cans of beer per week    Comment: 2-3 beers daily  . Drug use: No  . Sexual activity: Not on file  Lifestyle  . Physical activity:    Days per week: Not on file    Minutes per session: Not on file  . Stress: Not on file  Relationships  . Social connections:    Talks on phone: Not on file    Gets together: Not on file    Attends religious service: Not on file    Active member of club or organization: Not on file    Attends meetings of clubs or organizations: Not on file    Relationship status: Not on file  Other Topics Concern  . Not on file  Social History Narrative  . Not on file     Family History: The patient's family history includes Autoimmune disease in his mother; Cancer (age of onset: 21) in his paternal uncle; Diabetes in his sister; Gout in his paternal grandfather; Healthy in his mother; Heart attack in his father and paternal grandfather; Mental illness in his maternal grandmother; Stroke in his paternal grandmother. ROS:   Please see the history of present illness.    All other systems reviewed and are negative.  EKGs/Labs/Other Studies Reviewed:    The following studies were reviewed today:  EKG:  EKG ordered today.  The ekg ordered today demonstrates [facility] and normal  Recent Labs: 01/25/2018: ALT 36;  BUN 11; Creatinine, Ser 0.90; Potassium 4.9; Sodium 139  Recent Lipid Panel    Component Value Date/Time   CHOL 239 (H) 01/25/2018 0926   CHOL 178 01/15/2014 0801   TRIG 87 01/25/2018 0926   TRIG 233 (H) 01/15/2014 0801   HDL 68 01/25/2018 0926   HDL 62 01/15/2014 0801   CHOLHDL 3.5 01/25/2018 0926   CHOLHDL 4 11/02/2016 0829   VLDL 13.8 11/02/2016 0829   LDLCALC 154 (H) 01/25/2018 0926   LDLCALC 69 01/15/2014 0801   LDLDIRECT 205.1 12/26/2012 0938    Physical Exam:  VS:  BP 128/86 (BP Location: Right Arm, Patient Position: Sitting, Cuff Size: Normal)   Pulse 70   Ht 6\' 1"  (1.854 m)   Wt 182 lb 12.8 oz (82.9 kg)   SpO2 97%   BMI 24.12 kg/m     Wt Readings from Last 3 Encounters:  03/15/18 182 lb 12.8 oz (82.9 kg)  01/25/18 177 lb 12.8 oz (80.6 kg)  01/09/18 177 lb 0.6 oz (80.3 kg)     GEN:  Well nourished, well developed in no acute distress HEENT: Normal NECK: No JVD; No carotid bruits LYMPHATICS: No lymphadenopathy CARDIAC: RRR, no murmurs, rubs, gallops RESPIRATORY:  Clear to auscultation without rales, wheezing or rhonchi  ABDOMEN: Soft, non-tender, non-distended MUSCULOSKELETAL:  No edema; No deformity  SKIN: Warm and dry NEUROLOGIC:  Alert and oriented x 3 PSYCHIATRIC:  Normal affect    Signed, Shirlee More, MD  03/15/2018 9:34 AM    [place]

## 2018-03-14 NOTE — Progress Notes (Signed)
Cardiology Office Note:    Date:  03/15/2018   ID:  Tim Strickland, DOB [birth year], MRN [id number]  PCP:  Lance Sell, NP  Cardiologist:  Shirlee More, MD    Referring MD: Lance Sell, NP    ASSESSMENT:    1. Coronary artery disease of native artery of native heart with stable angina pectoris ([place])   2. Angina pectoris ([place])   3. Pre-op evaluation   4. Familial hyperlipidemia    PLAN:    In order of problems listed above:  1. Abnormal CTA and concern he is high risk anatomy he is symptomatic and referred for coronary angiography and revascularization if appropriate 2. He is having anginal equivalent severe limiting exertional dyspnea 3. Poorly controlled hyperlipidemia statin intolerant will start PCSK9 therapy and a minimum dose of a statin as background.  If another agent is needed Zetia would be appropriate   Next appointment: 1 month   Medication Adjustments/Labs and Tests Ordered: Current medicines are reviewed at length with the patient today.  Concerns regarding medicines are outlined above.  Orders Placed This Encounter  Procedures  . CBC  . Basic Metabolic Panel (BMET)  . AMB Referral to [facility]  . EKG 12-Lead   Meds ordered this encounter  Medications  . Evolocumab (REPATHA SURECLICK) 629 MG/ML SOAJ    Sig: Inject 140 mg into the skin every 14 (fourteen) days.  . Pitavastatin Calcium (LIVALO) 2 MG TABS    Sig: Take 1 tablet (2 mg total) by mouth daily.    Dispense:  30 tablet    Refill:  3    No chief complaint on file.   History of Present Illness:    Tim Strickland is a 53 y.o. male with a hx of angina and abnormal cardiac CTA last seen 01/25/18. Compliance with diet, lifestyle and medications: Yes  Is seen by me after his cardiac CTA he has severe flow-limiting sequential LAD stenosis as well as distal right coronary.  I reviewed the findings with patient he is a high healthcare literacy he understands  the findings and tells me that he is not feeling as well as he wants when he tries to do activities with his family walking fast or walking uphill he developed severe limiting shortness of breath anginal equivalent and the symptoms have progressed.  We discussed treatment options with additional antianginal medications or referral for coronary angiography and intervention if appropriate options benefits and risks detailed told the patient in my opinion he may have high risk anatomy and gave him a recommendation undergo coronary angiography he agrees.  Informed consent obtained and will be performed tomorrow and he is aware that the decision for revascularization will depend on angiography.  He has no contraindication to dual antiplatelet therapy or renal insufficiency or dye allergy.  He also has severe hyperlipidemia very poorly statin tolerant he had to discontinue Crestor.  We will start PCSK9 and place him on a minimal dose of Livalo as background statin therapy.  His LP(a) is normal. Past Medical History:  Diagnosis Date  . Asthma   . Elevated liver enzymes    PMH of  on Pravachol  . Hyperglycemia    A1c 5.2% in 2010  . Hyperlipidemia     Past Surgical History:  Procedure Laterality Date  . APPENDECTOMY    . TONSILLECTOMY    . VASECTOMY    . WISDOM TOOTH EXTRACTION      Current Medications: Current Meds  Medication Sig  . albuterol (PROVENTIL HFA;VENTOLIN HFA) 108 (90 Base) MCG/ACT inhaler Inhale 2 puffs into the lungs every 6 (six) hours as needed for wheezing or shortness of breath.  Marland Kitchen aspirin 81 MG tablet Take 81 mg by mouth daily.  . montelukast (SINGULAIR) 10 MG tablet TAKE 1 TABLET (10 MG TOTAL) BY MOUTH AT BEDTIME. MUST KEEP JAN APP FOR MORE  . nitroGLYCERIN (NITROSTAT) 0.4 MG SL tablet Place 1 tablet (0.4 mg total) under the tongue every 5 (five) minutes as needed for chest pain.  Marland Kitchen telmisartan (MICARDIS) 20 MG tablet Take 1 tablet (20 mg total) by mouth daily.      Allergies:   Pravastatin   Social History   Socioeconomic History  . Marital status: Married    Spouse name: Not on file  . Number of children: 2  . Years of education: 34  . Highest education level: Not on file  Occupational History  . Occupation: Programme researcher, broadcasting/film/video  Social Needs  . Financial resource strain: Not on file  . Food insecurity:    Worry: Not on file    Inability: Not on file  . Transportation needs:    Medical: Not on file    Non-medical: Not on file  Tobacco Use  . Smoking status: Never Smoker  . Smokeless tobacco: Never Used  Substance and Sexual Activity  . Alcohol use: Yes    Alcohol/week: 2.0 - 3.0 standard drinks    Types: 2 - 3 Cans of beer per week    Comment: 2-3 beers daily  . Drug use: No  . Sexual activity: Not on file  Lifestyle  . Physical activity:    Days per week: Not on file    Minutes per session: Not on file  . Stress: Not on file  Relationships  . Social connections:    Talks on phone: Not on file    Gets together: Not on file    Attends religious service: Not on file    Active member of club or organization: Not on file    Attends meetings of clubs or organizations: Not on file    Relationship status: Not on file  Other Topics Concern  . Not on file  Social History Narrative  . Not on file     Family History: The patient's family history includes Autoimmune disease in his mother; Cancer (age of onset: 47) in his paternal uncle; Diabetes in his sister; Gout in his paternal grandfather; Healthy in his mother; Heart attack in his father and paternal grandfather; Mental illness in his maternal grandmother; Stroke in his paternal grandmother. ROS:   Please see the history of present illness.    All other systems reviewed and are negative.  EKGs/Labs/Other Studies Reviewed:    The following studies were reviewed today:  EKG:  EKG ordered today.  The ekg ordered today demonstrates [facility] and normal  Recent Labs: 01/25/2018: ALT 36;  BUN 11; Creatinine, Ser 0.90; Potassium 4.9; Sodium 139  Recent Lipid Panel    Component Value Date/Time   CHOL 239 (H) 01/25/2018 0926   CHOL 178 01/15/2014 0801   TRIG 87 01/25/2018 0926   TRIG 233 (H) 01/15/2014 0801   HDL 68 01/25/2018 0926   HDL 62 01/15/2014 0801   CHOLHDL 3.5 01/25/2018 0926   CHOLHDL 4 11/02/2016 0829   VLDL 13.8 11/02/2016 0829   LDLCALC 154 (H) 01/25/2018 0926   LDLCALC 69 01/15/2014 0801   LDLDIRECT 205.1 12/26/2012 0938    Physical Exam:  VS:  BP 128/86 (BP Location: Right Arm, Patient Position: Sitting, Cuff Size: Normal)   Pulse 70   Ht 6\' 1"  (1.854 m)   Wt 182 lb 12.8 oz (82.9 kg)   SpO2 97%   BMI 24.12 kg/m     Wt Readings from Last 3 Encounters:  03/15/18 182 lb 12.8 oz (82.9 kg)  01/25/18 177 lb 12.8 oz (80.6 kg)  01/09/18 177 lb 0.6 oz (80.3 kg)     GEN:  Well nourished, well developed in no acute distress HEENT: Normal NECK: No JVD; No carotid bruits LYMPHATICS: No lymphadenopathy CARDIAC: RRR, no murmurs, rubs, gallops RESPIRATORY:  Clear to auscultation without rales, wheezing or rhonchi  ABDOMEN: Soft, non-tender, non-distended MUSCULOSKELETAL:  No edema; No deformity  SKIN: Warm and dry NEUROLOGIC:  Alert and oriented x 3 PSYCHIATRIC:  Normal affect    Signed, Shirlee More, MD  03/15/2018 9:34 AM    [place]

## 2018-03-15 ENCOUNTER — Encounter: Payer: Self-pay | Admitting: Cardiology

## 2018-03-15 ENCOUNTER — Ambulatory Visit (INDEPENDENT_AMBULATORY_CARE_PROVIDER_SITE_OTHER): Payer: 59 | Admitting: Cardiology

## 2018-03-15 VITALS — BP 128/86 | HR 70 | Ht 73.0 in | Wt 182.8 lb

## 2018-03-15 DIAGNOSIS — Z01818 Encounter for other preprocedural examination: Secondary | ICD-10-CM

## 2018-03-15 DIAGNOSIS — I209 Angina pectoris, unspecified: Secondary | ICD-10-CM

## 2018-03-15 DIAGNOSIS — I25118 Atherosclerotic heart disease of native coronary artery with other forms of angina pectoris: Secondary | ICD-10-CM | POA: Diagnosis not present

## 2018-03-15 DIAGNOSIS — E7849 Other hyperlipidemia: Secondary | ICD-10-CM | POA: Diagnosis not present

## 2018-03-15 LAB — CBC
HEMOGLOBIN: 15.9 g/dL (ref 13.0–17.7)
Hematocrit: 44.6 % (ref 37.5–51.0)
MCH: 32.3 pg (ref 26.6–33.0)
MCHC: 35.7 g/dL (ref 31.5–35.7)
MCV: 91 fL (ref 79–97)
Platelets: 284 10*3/uL (ref 150–450)
RBC: 4.93 x10E6/uL (ref 4.14–5.80)
RDW: 13.6 % (ref 11.6–15.4)
WBC: 7.6 10*3/uL (ref 3.4–10.8)

## 2018-03-15 LAB — BASIC METABOLIC PANEL
BUN / CREAT RATIO: 14 (ref 9–20)
BUN: 13 mg/dL (ref 6–24)
CO2: 27 mmol/L (ref 20–29)
CREATININE: 0.96 mg/dL (ref 0.76–1.27)
Calcium: 9.6 mg/dL (ref 8.7–10.2)
Chloride: 106 mmol/L (ref 96–106)
GFR calc Af Amer: 105 mL/min/{1.73_m2} (ref >59)
GFR, EST NON AFRICAN AMERICAN: 91 mL/min/{1.73_m2} (ref >59)
GLUCOSE: 108 mg/dL — AB (ref 65–99)
Potassium: 4.8 mmol/L (ref 3.5–5.2)
Sodium: 138 mmol/L (ref 134–144)

## 2018-03-15 MED ORDER — EVOLOCUMAB 140 MG/ML ~~LOC~~ SOAJ: 140.0000 mg | SUBCUTANEOUS | Status: DC

## 2018-03-15 MED ORDER — PITAVASTATIN CALCIUM 2 MG PO TABS: 2.0000 mg | ORAL_TABLET | Freq: Every day | ORAL | 3 refills | Status: DC

## 2018-03-15 NOTE — Patient Instructions (Addendum)
Medication Instructions:  Your physician has recommended you make the following change in your medication:   START repatha 140 mg: inject 1 syringe (140 mg) into the subcutaneous tissue every 14 days-you will wait to see the lipid clinic before starting START pitavastatin (livalo) 2 mg: Take 1 tablet daily   If you need a refill on your cardiac medications before your next appointment, please call your pharmacy.   Lab work: Your physician recommends that you return for lab work today: STAT CBC, BMP.   If you have labs (blood work) drawn today and your tests are completely normal, you will receive your results only by: Marland Kitchen MyChart Message (if you have MyChart) OR . A paper copy in the mail If you have any lab test that is abnormal or we need to change your treatment, we will call you to review the results.  Testing/Procedures: You had an EKG today.   You have been referred to the lipid clinic for new start of repatha. You will be contacted to schedule this appointment.      [facility] [address] [place], [place] [address] Dept: [phone] Loc: [phone]  Tim Strickland  03/15/2018  You are scheduled for a Cardiac Catheterization on Friday, January 10 with Dr. Larae Strickland.  1. Please arrive at the [facility] (Main Entrance A) at [facility]: [address] [place], [place] [address] at 8:30 AM (This time is two hours before your procedure to ensure your preparation). Free valet parking service is available.   Special note: Every effort is made to have your procedure done on time. Please understand that emergencies sometimes delay scheduled procedures.  2. Diet: Do not eat solid foods after midnight.  The patient may have clear liquids until 5am upon the day of the procedure.  3. Labs: None needed.  4. Medication instructions in preparation for your procedure:    Contrast Allergy: No   On the morning of your procedure, take your Aspirin and any morning medicines NOT listed above.  You may use sips of water.  5. Plan for one night stay--bring personal belongings. 6. Bring a current list of your medications and current insurance cards. 7. You MUST have a responsible person to drive you home. 8. Someone MUST be with you the first 24 hours after you arrive home or your discharge will be delayed. 9. Please wear clothes that are easy to get on and off and wear slip-on shoes.  Thank you for allowing [place] to care for you!   -- [facility]    Follow-Up: At [facility], you and your health needs are our priority.  As part of our continuing mission to provide you with exceptional heart care, we have created designated Provider Care Teams.  These Care Teams include your primary Cardiologist (physician) and Advanced Practice Providers (APPs -  Physician Assistants and Nurse Practitioners) who all work together to provide you with the care you need, when you need it. You will need a follow up appointment in 4 weeks.       [email or web address] [email or web address]        FDA-cleared, clinical grade mobile EKG monitor: Tim Strickland is the most clinically-validated mobile EKG used by the world's leading cardiac care medical professionals With Basic service, know instantly if your heart rhythm is normal or if atrial fibrillation is detected, and email the last single EKG recording to yourself or  your doctor Premium service, available for purchase through the [employer] app for $9.99 per month or $99 per year, includes unlimited history and storage of your EKG recordings, a monthly EKG summary report to share with your doctor, along with the ability to track your blood pressure, activity and weight Includes one K[employer]iaMobile phone clip FREE SHIPPING: Standard delivery 1-3 business days. Orders placed by 11:00am PST will  ship that afternoon. Otherwise, will ship next business day. All orders ship via [employer] from [place], [place]     Coronary Angiogram With Stent Coronary angiogram with stent placement is a procedure to widen or open a narrow blood vessel of the heart (coronary artery). Arteries may become blocked by cholesterol buildup (plaques) in the lining of the wall. When a coronary artery becomes partially blocked, blood flow to that area decreases. This may lead to chest pain or a heart attack (myocardial infarction). A stent is a small piece of metal that looks like mesh or a spring. Stent placement may be done as treatment for a heart attack or right after a coronary angiogram in which a blocked artery is found. Let your health care provider know about:  Any allergies you have.  All medicines you are taking, including vitamins, herbs, eye drops, creams, and over-the-counter medicines.  Any problems you or family members have had with anesthetic medicines.  Any blood disorders you have.  Any surgeries you have had.  Any medical conditions you have.  Whether you are pregnant or may be pregnant. What are the risks? Generally, this is a safe procedure. However, problems may occur, including:  Damage to the heart or its blood vessels.  A return of blockage.  Bleeding, infection, or bruising at the insertion site.  A collection of blood under the skin (hematoma) at the insertion site.  A blood clot in another part of the body.  Kidney injury.  Allergic reaction to the dye or contrast that is used.  Bleeding into the abdomen (retroperitoneal bleeding). What happens before the procedure? Staying hydrated Follow instructions from your health care provider about hydration, which may include:  Up to 2 hours before the procedure - you may continue to drink clear liquids, such as water, clear fruit juice, black coffee, and plain tea.  Eating and drinking restrictions Follow  instructions from your health care provider about eating and drinking, which may include:  8 hours before the procedure - stop eating heavy meals or foods such as meat, fried foods, or fatty foods.  6 hours before the procedure - stop eating light meals or foods, such as toast or cereal.  2 hours before the procedure - stop drinking clear liquids. Ask your health care provider about:  Changing or stopping your regular medicines. This is especially important if you are taking diabetes medicines or blood thinners.  Taking medicines such as ibuprofen. These medicines can thin your blood. Do not take these medicines before your procedure if your health care provider instructs you not to. Generally, aspirin is recommended before a procedure of passing a small, thin tube (catheter) through a blood vessel and into the heart (cardiac catheterization). What happens during the procedure?   An IV tube will be inserted into one of your veins.  You will be given one or more of the following: ? A medicine to help you relax (sedative). ? A medicine to numb the area where the catheter will be inserted into an artery (local anesthetic).  To reduce your risk of infection: ?  Your health care team will wash or sanitize their hands. ? Your skin will be washed with soap. ? Hair may be removed from the area where the catheter will be inserted.  Using a guide wire, the catheter will be inserted into an artery. The location may be in your groin, in your wrist, or in the fold of your arm (near your elbow).  A type of X-ray (fluoroscopy) will be used to help guide the catheter to the opening of the arteries in the heart.  A dye will be injected into the catheter, and X-rays will be taken. The dye will help to show where any narrowing or blockages are located in the arteries.  A tiny wire will be guided to the blocked spot, and a balloon will be inflated to make the artery wider.  The stent will be expanded  and will crush the plaques into the wall of the vessel. The stent will hold the area open and improve the blood flow. Most stents have a drug coating to reduce the risk of the stent narrowing over time.  The artery may be made wider using a drill, laser, or other tools to remove plaques.  When the blood flow is better, the catheter will be removed. The lining of the artery will grow over the stent, which stays where it was placed. This procedure may vary among health care providers and hospitals. What happens after the procedure?  If the procedure is done through the leg, you will be kept in bed lying flat for about 6 hours. You will be instructed to not bend and not cross your legs.  The insertion site will be checked frequently.  The pulse in your foot or wrist will be checked frequently.  You may have additional blood tests, X-rays, and a test that records the electrical activity of your heart (electrocardiogram, or ECG). This information is not intended to replace advice given to you by your health care provider. Make sure you discuss any questions you have with your health care provider. Document Released: 08/28/2002 Document Revised: 06/02/2017 Document Reviewed: 09/27/2015 Elsevier Interactive Patient Education  2019 [place] injection What is this medicine? EVOLOCUMAB (e voe LOK ue mab) is known as a PCSK9 inhibitor. It is used to lower the level of cholesterol in the blood. It may be used alone or in combination with other cholesterol-lowering drugs. This drug may also be used to reduce the risk of heart attack, stroke, and certain types of heart surgery in patients with heart disease. This medicine may be used for other purposes; ask your health care provider or pharmacist if you have questions. COMMON BRAND NAME(S): Repatha What should I tell my health care provider before I take this medicine? They need to know if you have any of these conditions: -an  unusual or allergic reaction to evolocumab, other medicines, foods, dyes, or preservatives -pregnant or trying to get pregnant -breast-feeding How should I use this medicine? This medicine is for injection under the skin. You will be taught how to prepare and give this medicine. Use exactly as directed. Take your medicine at regular intervals. Do not take your medicine more often than directed. It is important that you put your used needles and syringes in a special sharps container. Do not put them in a trash can. If you do not have a sharps container, call your pharmacist or health care provider to get one. Talk to your pediatrician regarding the use of this  medicine in children. While this drug may be prescribed for children as young as 13 years for selected conditions, precautions do apply. Overdosage: If you think you have taken too much of this medicine contact a poison control center or emergency room at once. NOTE: This medicine is only for you. Do not share this medicine with others. What if I miss a dose? If you miss a dose, take it as soon as you can if there are more than 7 days until the next scheduled dose, or skip the missed dose and take the next dose according to your original schedule. Do not take double or extra doses. What may interact with this medicine? Interactions are not expected. This list may not describe all possible interactions. Give your health care provider a list of all the medicines, herbs, non-prescription drugs, or dietary supplements you use. Also tell them if you smoke, drink alcohol, or use illegal drugs. Some items may interact with your medicine. What should I watch for while using this medicine? You may need blood work while you are taking this medicine. What side effects may I notice from receiving this medicine? Side effects that you should report to your doctor or health care professional as soon as possible: -allergic reactions like skin rash, itching  or hives, swelling of the face, lips, or tongue -signs and symptoms of high blood sugar such as dizziness; dry mouth; dry skin; fruity breath; nausea; stomach pain; increased hunger or thirst; increased urination -signs and symptoms of infection like fever or chills; cough; sore throat; pain or trouble passing urine Side effects that usually do not require medical attention (report to your doctor or health care professional if they continue or are bothersome): -diarrhea -nausea -muscle pain -pain, redness, or irritation at site where injected This list may not describe all possible side effects. Call your doctor for medical advice about side effects. You may report side effects to FDA at 1-800-FDA-1088. Where should I keep my medicine? Keep out of the reach of children. You will be instructed on how to store this medicine. Throw away any unused medicine after the expiration date on the label. NOTE: This sheet is a summary. It may not cover all possible information. If you have questions about this medicine, talk to your doctor, pharmacist, or health care provider.  2019 Elsevier/Gold Standard (2016-10-03 13:31:00)    Pitavastatin oral tablets What is this medicine? PITAVASTATIN (pit A va STAT in) is known as a HMG-CoA reductase inhibitor or 'statin'. It lowers the level of cholesterol and triglycerides in the blood. Diet and lifestyle changes are often used with this drug. This medicine may be used for other purposes; ask your health care provider or pharmacist if you have questions. COMMON BRAND NAME(S): Livalo, Zypitamag What should I tell my health care provider before I take this medicine? They need to know if you have any of these conditions: -diabetes -if you often drink alcohol -history of stroke -kidney disease -liver disease -muscle aches or weakness -thyroid disease -an unusual or allergic reaction to pitavastatin, other medicines, foods, dyes, or preservatives -pregnant or  trying to get pregnant -breast-feeding How should I use this medicine? Take this medicine by mouth with a glass of water. Follow the directions on the prescription label. You can take it with or without food. If it upsets your stomach, take it with food. Take your medicine at regular intervals. Do not take it more often than directed. Do not stop taking except on your doctor's advice.  Talk to your pediatrician regarding the use of this medicine in children. While this drug may be prescribed for children as young as 8 years for selected conditions, precautions do apply. Overdosage: If you think you have taken too much of this medicine contact a poison control center or emergency room at once. NOTE: This medicine is only for you. Do not share this medicine with others. What if I miss a dose? If you miss a dose, take it as soon as you can. If it is almost time for your next dose, take only that dose. Do not take double or extra doses. What may interact with this medicine? Do not take this medicine with any of the following medications: - cyclosporine - gemfibrozil - herbal medicines like Strickland yeast rice This medicine may also interact with the following medications: - alcohol - antiviral medicines for HIV or AIDS - erythromycin - other medicines for cholesterol - rifampin - warfarin This list may not describe all possible interactions. Give your health care provider a list of all the medicines, herbs, non-prescription drugs, or dietary supplements you use. Also tell them if you smoke, drink alcohol, or use illegal drugs. Some items may interact with your medicine. What should I watch for while using this medicine? Visit your doctor or health care professional for regular check-ups. You may need regular tests to make sure your liver is working properly. Your health care professional may tell you to stop taking this medicine if you develop muscle problems. If your muscle problems do not go away  after stopping this medicine, contact your health care professional. Do not become pregnant while taking this medicine. Women should inform their health care professional if they wish to become pregnant or think they might be pregnant. There is a potential for serious side effects to an unborn child. Talk to your health care professional or pharmacist for more information. Do not breast-feed an infant while taking this medicine. This medicine may affect blood sugar levels. If you have diabetes, check with your doctor or health care professional before you change your diet or the dose of your diabetic medicine. If you are going to need surgery or other procedure, tell your doctor that you are using this medicine. This drug is only part of a total heart-health program. Your doctor or a dietician can suggest a low-cholesterol and low-fat diet to help. Avoid alcohol and smoking, and keep a proper exercise schedule. This medicine may cause a decrease in Co-Enzyme Q-10. You should make sure that you get enough Co-Enzyme Q-10 while you are taking this medicine. Discuss the foods you eat and the vitamins you take with your health care professional. What side effects may I notice from receiving this medicine? Side effects that you should report to your doctor or health care professional as soon as possible: - allergic reactions like skin rash, itching or hives, swelling of the face, lips, or tongue - dark urine - fever - joint pain - muscle cramps or pain - redness, blistering, peeling or loosening of the skin, including inside the mouth - trouble passing urine or change in the amount of urine - unusually weak or tired - yellowing of the eyes or skin Side effects that usually do not require medical attention (report to your doctor or health care professional if they continue or are bothersome): - constipation - heartburn - nausea This list may not describe all possible side effects. Call your doctor for  medical advice about side effects. You may  report side effects to FDA at 1-800-FDA-1088. Where should I keep my medicine? Keep out of the reach of children. Store at room temperature between 15 and 30 degrees C (59 and 86 degrees F). Protect from light. Throw away any unused medicine after the expiration date. NOTE: This sheet is a summary. It may not cover all possible information. If you have questions about this medicine, talk to your doctor, pharmacist, or health care provider.  2019 Elsevier/Gold Standard (2016-10-25 12:48:36)

## 2018-03-16 ENCOUNTER — Inpatient Hospital Stay (HOSPITAL_COMMUNITY): Payer: 59

## 2018-03-16 ENCOUNTER — Inpatient Hospital Stay (HOSPITAL_COMMUNITY)
Admission: RE | Admit: 2018-03-16 | Discharge: 2018-03-23 | DRG: 234 | Disposition: A | Discharge status: Home / Self Care | Payer: 59 | Attending: Surgery | Admitting: Surgery

## 2018-03-16 ENCOUNTER — Encounter (HOSPITAL_COMMUNITY)
Admission: RE | Disposition: A | Discharge status: Home / Self Care | Payer: Self-pay | Source: Home / Self Care | Attending: Cardiovascular Disease

## 2018-03-16 ENCOUNTER — Other Ambulatory Visit: Payer: Self-pay

## 2018-03-16 ENCOUNTER — Other Ambulatory Visit: Payer: Self-pay | Admitting: *Deleted

## 2018-03-16 ENCOUNTER — Encounter (HOSPITAL_COMMUNITY): Payer: Self-pay | Admitting: Cardiovascular Disease

## 2018-03-16 DIAGNOSIS — Z832 Family history of diseases of the blood and blood-forming organs and certain disorders involving the immune mechanism: Secondary | ICD-10-CM

## 2018-03-16 DIAGNOSIS — I2510 Atherosclerotic heart disease of native coronary artery without angina pectoris: Secondary | ICD-10-CM

## 2018-03-16 DIAGNOSIS — Z01818 Encounter for other preprocedural examination: Secondary | ICD-10-CM

## 2018-03-16 DIAGNOSIS — I25110 Atherosclerotic heart disease of native coronary artery with unstable angina pectoris: Principal | ICD-10-CM

## 2018-03-16 DIAGNOSIS — Z833 Family history of diabetes mellitus: Secondary | ICD-10-CM

## 2018-03-16 DIAGNOSIS — E8770 Fluid overload, unspecified: Secondary | ICD-10-CM | POA: Diagnosis not present

## 2018-03-16 DIAGNOSIS — Z951 Presence of aortocoronary bypass graft: Secondary | ICD-10-CM

## 2018-03-16 DIAGNOSIS — Z8249 Family history of ischemic heart disease and other diseases of the circulatory system: Secondary | ICD-10-CM

## 2018-03-16 DIAGNOSIS — R000 Tachycardia, unspecified: Secondary | ICD-10-CM | POA: Diagnosis not present

## 2018-03-16 DIAGNOSIS — E7849 Other hyperlipidemia: Secondary | ICD-10-CM | POA: Diagnosis present

## 2018-03-16 DIAGNOSIS — Z7982 Long term (current) use of aspirin: Secondary | ICD-10-CM

## 2018-03-16 DIAGNOSIS — Z79899 Other long term (current) drug therapy: Secondary | ICD-10-CM | POA: Diagnosis not present

## 2018-03-16 DIAGNOSIS — I10 Essential (primary) hypertension: Secondary | ICD-10-CM | POA: Diagnosis present

## 2018-03-16 DIAGNOSIS — Z823 Family history of stroke: Secondary | ICD-10-CM

## 2018-03-16 DIAGNOSIS — I200 Unstable angina: Secondary | ICD-10-CM

## 2018-03-16 DIAGNOSIS — Z888 Allergy status to other drugs, medicaments and biological substances status: Secondary | ICD-10-CM

## 2018-03-16 DIAGNOSIS — Z01810 Encounter for preprocedural cardiovascular examination: Secondary | ICD-10-CM | POA: Diagnosis not present

## 2018-03-16 DIAGNOSIS — I209 Angina pectoris, unspecified: Secondary | ICD-10-CM

## 2018-03-16 HISTORY — PX: LEFT HEART CATH AND CORONARY ANGIOGRAPHY: CATH118249

## 2018-03-16 HISTORY — DX: Unspecified asthma, uncomplicated: J45.909

## 2018-03-16 HISTORY — DX: Essential (primary) hypertension: I10

## 2018-03-16 HISTORY — DX: Atherosclerotic heart disease of native coronary artery without angina pectoris: I25.10

## 2018-03-16 LAB — PULMONARY FUNCTION TEST
DL/VA % pred: 95 %
DL/VA: 4.58 ml/min/mmHg/L
DLCO unc % pred: 78 %
DLCO unc: 28.53 ml/min/mmHg
FEF 25-75 Post: 2.87 L/sec
FEF 25-75 Pre: 1.88 L/sec
FEF2575-%Change-Post: 52 %
FEF2575-%Pred-Post: 78 %
FEF2575-%Pred-Pre: 51 %
FEV1-%Change-Post: 17 %
FEV1-%Pred-Post: 78 %
FEV1-%Pred-Pre: 66 %
FEV1-Post: 3.33 L
FEV1-Pre: 2.84 L
FEV1FVC-%Change-Post: 1 %
FEV1FVC-%PRED-PRE: 88 %
FEV6-%Change-Post: 14 %
FEV6-%Pred-Post: 88 %
FEV6-%Pred-Pre: 76 %
FEV6-PRE: 4.09 L
FEV6-Post: 4.71 L
FEV6FVC-%Change-Post: -1 %
FEV6FVC-%Pred-Post: 101 %
FEV6FVC-%Pred-Pre: 102 %
FVC-%Change-Post: 15 %
FVC-%Pred-Post: 87 %
FVC-%Pred-Pre: 75 %
FVC-Post: 4.82 L
FVC-Pre: 4.16 L
Post FEV1/FVC ratio: 69 %
Post FEV6/FVC ratio: 98 %
Pre FEV1/FVC ratio: 68 %
Pre FEV6/FVC Ratio: 99 %
RV % pred: 126 %
RV: 2.83 L
TLC % pred: 92 %
TLC: 7 L

## 2018-03-16 LAB — ECHOCARDIOGRAM COMPLETE
Height: 73 in
Weight: 2912 oz

## 2018-03-16 SURGERY — LEFT HEART CATH AND CORONARY ANGIOGRAPHY
Anesthesia: LOCAL

## 2018-03-16 MED ORDER — HEPARIN (PORCINE) IN NACL 1000-0.9 UT/500ML-% IV SOLN
INTRAVENOUS | Status: DC | PRN
  Administered 2018-03-16 (×2): 500 mL

## 2018-03-16 MED ORDER — HEPARIN SODIUM (PORCINE) 1000 UNIT/ML IJ SOLN
INTRAMUSCULAR | Status: DC | PRN
  Administered 2018-03-16: 4000 [IU] via INTRAVENOUS

## 2018-03-16 MED ORDER — LABETALOL HCL 5 MG/ML IV SOLN
10.0000 mg | Freq: Once | INTRAVENOUS | Status: AC
  Administered 2018-03-16: 10 mg via INTRAVENOUS

## 2018-03-16 MED ORDER — MIDAZOLAM HCL 2 MG/2ML IJ SOLN
INTRAMUSCULAR | Status: DC | PRN
  Administered 2018-03-16: 1 mg via INTRAVENOUS

## 2018-03-16 MED ORDER — EVOLOCUMAB 140 MG/ML ~~LOC~~ SOAJ: 140.0000 mg | SUBCUTANEOUS | Status: DC

## 2018-03-16 MED ORDER — SODIUM CHLORIDE 0.9% FLUSH: 3.0000 mL | Freq: Two times a day (BID) | INTRAVENOUS | Status: DC

## 2018-03-16 MED ORDER — ENSURE ENLIVE PO LIQD
237.0000 mL | Freq: Two times a day (BID) | ORAL | Status: DC
  Administered 2018-03-17 – 2018-03-18 (×3): 237 mL via ORAL

## 2018-03-16 MED ORDER — LIDOCAINE HCL (PF) 1 % IJ SOLN
INTRAMUSCULAR | Status: AC
  Filled 2018-03-16: qty 30

## 2018-03-16 MED ORDER — NITROGLYCERIN 0.4 MG SL SUBL: 0.4000 mg | SUBLINGUAL_TABLET | SUBLINGUAL | Status: DC | PRN

## 2018-03-16 MED ORDER — ALBUTEROL SULFATE (2.5 MG/3ML) 0.083% IN NEBU
2.5000 mg | INHALATION_SOLUTION | Freq: Four times a day (QID) | RESPIRATORY_TRACT | Status: DC | PRN
  Administered 2018-03-16: 2.5 mg via RESPIRATORY_TRACT

## 2018-03-16 MED ORDER — HEPARIN (PORCINE) IN NACL 1000-0.9 UT/500ML-% IV SOLN
INTRAVENOUS | Status: AC
  Filled 2018-03-16: qty 500

## 2018-03-16 MED ORDER — HEPARIN (PORCINE) 25000 UT/250ML-% IV SOLN
1150.0000 [IU]/h | INTRAVENOUS | Status: DC
  Administered 2018-03-16: 1000 [IU]/h via INTRAVENOUS
  Administered 2018-03-18: 1150 [IU]/h via INTRAVENOUS
  Filled 2018-03-16 (×3): qty 250

## 2018-03-16 MED ORDER — IOHEXOL 350 MG/ML SOLN
INTRAVENOUS | Status: DC | PRN
  Administered 2018-03-16: 90 mL via INTRA_ARTERIAL

## 2018-03-16 MED ORDER — ACETAMINOPHEN 500 MG PO TABS
1000.0000 mg | ORAL_TABLET | Freq: Every day | ORAL | Status: DC | PRN
  Administered 2018-03-16 – 2018-03-17 (×2): 1000 mg via ORAL
  Filled 2018-03-16 (×2): qty 2

## 2018-03-16 MED ORDER — IRBESARTAN 150 MG PO TABS
150.0000 mg | ORAL_TABLET | Freq: Every day | ORAL | Status: DC
  Administered 2018-03-16 – 2018-03-18 (×3): 150 mg via ORAL
  Filled 2018-03-16 (×3): qty 1

## 2018-03-16 MED ORDER — FENTANYL CITRATE (PF) 100 MCG/2ML IJ SOLN
INTRAMUSCULAR | Status: DC | PRN
  Administered 2018-03-16: 25 ug via INTRAVENOUS

## 2018-03-16 MED ORDER — SODIUM CHLORIDE 0.9 % IV SOLN: 250.0000 mL | INTRAVENOUS | Status: DC | PRN

## 2018-03-16 MED ORDER — SODIUM CHLORIDE 0.9% FLUSH: 3.0000 mL | INTRAVENOUS | Status: DC | PRN

## 2018-03-16 MED ORDER — SODIUM CHLORIDE 0.9 % IV SOLN: INTRAVENOUS | Status: AC

## 2018-03-16 MED ORDER — ASPIRIN EC 81 MG PO TBEC
81.0000 mg | DELAYED_RELEASE_TABLET | Freq: Every day | ORAL | Status: DC
  Administered 2018-03-17 – 2018-03-18 (×2): 81 mg via ORAL
  Filled 2018-03-16 (×2): qty 1

## 2018-03-16 MED ORDER — ASPIRIN 81 MG PO CHEW: 81.0000 mg | CHEWABLE_TABLET | ORAL | Status: DC

## 2018-03-16 MED ORDER — CARVEDILOL 3.125 MG PO TABS
3.1250 mg | ORAL_TABLET | Freq: Two times a day (BID) | ORAL | Status: DC
  Administered 2018-03-16 – 2018-03-18 (×5): 3.125 mg via ORAL
  Filled 2018-03-16 (×5): qty 1

## 2018-03-16 MED ORDER — LIDOCAINE HCL (PF) 1 % IJ SOLN
INTRAMUSCULAR | Status: DC | PRN
  Administered 2018-03-16: 2 mL

## 2018-03-16 MED ORDER — HEPARIN SODIUM (PORCINE) 1000 UNIT/ML IJ SOLN
INTRAMUSCULAR | Status: AC
  Filled 2018-03-16: qty 1

## 2018-03-16 MED ORDER — MIDAZOLAM HCL 2 MG/2ML IJ SOLN
INTRAMUSCULAR | Status: AC
  Filled 2018-03-16: qty 2

## 2018-03-16 MED ORDER — SODIUM CHLORIDE 0.9 % WEIGHT BASED INFUSION: 1.0000 mL/kg/h | INTRAVENOUS | Status: DC

## 2018-03-16 MED ORDER — LABETALOL HCL 5 MG/ML IV SOLN
INTRAVENOUS | Status: AC
  Filled 2018-03-16: qty 4

## 2018-03-16 MED ORDER — VERAPAMIL HCL 2.5 MG/ML IV SOLN
INTRAVENOUS | Status: DC | PRN
  Administered 2018-03-16: 10 mL via INTRA_ARTERIAL

## 2018-03-16 MED ORDER — ONDANSETRON HCL 4 MG/2ML IJ SOLN: 4.0000 mg | Freq: Four times a day (QID) | INTRAMUSCULAR | Status: DC | PRN

## 2018-03-16 MED ORDER — FENTANYL CITRATE (PF) 100 MCG/2ML IJ SOLN
INTRAMUSCULAR | Status: AC
  Filled 2018-03-16: qty 2

## 2018-03-16 MED ORDER — SODIUM CHLORIDE 0.9 % WEIGHT BASED INFUSION
3.0000 mL/kg/h | INTRAVENOUS | Status: DC
  Administered 2018-03-16: 3 mL/kg/h via INTRAVENOUS

## 2018-03-16 SURGICAL SUPPLY — 11 items
CATH INFINITI 5FR ANG PIGTAIL (CATHETERS) ×1 IMPLANT
CATH INFINITI 5FR JK (CATHETERS) ×1 IMPLANT
DEVICE RAD COMP TR BAND LRG (VASCULAR PRODUCTS) ×1 IMPLANT
GLIDESHEATH SLEND SS 6F .021 (SHEATH) ×1 IMPLANT
GUIDEWIRE INQWIRE 1.5J.035X260 (WIRE) IMPLANT
INQWIRE 1.5J .035X260CM (WIRE) ×2
KIT HEART LEFT (KITS) ×2 IMPLANT
PACK CARDIAC CATHETERIZATION (CUSTOM PROCEDURE TRAY) ×2 IMPLANT
SYR MEDRAD MARK 7 150ML (SYRINGE) ×2 IMPLANT
TRANSDUCER W/STOPCOCK (MISCELLANEOUS) ×2 IMPLANT
TUBING CIL FLEX 10 FLL-RA (TUBING) ×2 IMPLANT

## 2018-03-16 NOTE — Interval H&P Note (Signed)
Cath Lab Visit (complete for each Cath Lab visit)  Clinical Evaluation Leading to the Procedure:   ACS: No.  Non-ACS:    Anginal Classification: CCS III  Anti-ischemic medical therapy: No Therapy  Non-Invasive Test Results: High-risk stress test findings: cardiac mortality >3%/year  Prior CABG: No previous CABG      History and Physical Interval Note:  03/16/2018 11:05 AM  Tim Strickland  has presented today for surgery, with the diagnosis of ACTA  The various methods of treatment have been discussed with the patient and family. After consideration of risks, benefits and other options for treatment, the patient has consented to  Procedure(s): LEFT HEART CATH AND CORONARY ANGIOGRAPHY (N/A) as a surgical intervention .  The patient's history has been reviewed, patient examined, no change in status, stable for surgery.  I have reviewed the patient's chart and labs.  Questions were answered to the patient's satisfaction.     Kathlyn Sacramento

## 2018-03-16 NOTE — Progress Notes (Signed)
[facility] called for CABG consult.

## 2018-03-16 NOTE — Progress Notes (Signed)
ANTICOAGULATION CONSULT NOTE  Pharmacy Consult for heparin Indication: CP/ACS  Heparin Dosing Weight: 82.6 kg  Labs: Recent Labs    03/15/18 0945  HGB 15.9  HCT 44.6  PLT 284  CREATININE 0.96    Assessment: 1 yom admitted with CP, now s/p cath 1/10 found to have severe two-vessel. Pharmacy consulted to start heparin 8 hrs post-sheath removal and CVTS consulted - sheath removed at 1144 per cath procedure log. Not on anticoagulation PTA. CBC wnl. No bleed documented.  Goal of Therapy:  Heparin level 0.3-0.7 units/ml Monitor platelets by anticoagulation protocol: Yes   Plan:  Start heparin at 1000 units/hr at [facility] (8 hrs post-sheath removal) 6h heparin level Monitor daily heparin level and CBC, s/sx bleeding F/u Cardiology plans  Elicia Lamp, PharmD, BCPS Clinical Pharmacist 03/16/2018 1:08 PM

## 2018-03-16 NOTE — Consult Note (Signed)
[address]       [place] [address]             [phone]        Jama L Tomassetti [place] Medical Record #[id number] Date of Birth: 08/18/1965  Referring: Fletcher Anon Primary Care: Lance Sell, NP Primary Cardiologist: Bettina Gavia  Chief Complaint:  CAD  History of Present Illness:      Mr. Blass is a 53 yo white male with known history of Hyperlipidemia, HTN, and positive family history for CAD.  The patient noticed over the summer episodes of nausea, increased saliva production, and a band of chest tightness across his chest.  He states this would occur with exertion and would occasionally radiate down his left arm.  He was evaluated by Dr. Bettina Gavia who put patient on ASA therapy.  He was started on Telmisartan for BP control.  He also underwent a cardiac CT which showed evidence of CAD in the LAD.  It was felt with patient's angina and family history he should undergo cardiac catheterization.  This was done electively today and he was found to have a preserved EF and severe 2 vessel CAD.  It was felt coronary bypass grafting would be indicated and TCTS consult was requested.  Currently the patient is chest pain free.  He denies any history of nicotine abuse.  He works as a Chief Strategy Officer and is very active, but has slowed down at times due to above mentioned symptoms with activity.  He is agreeable to proceed with coronary bypass.   Current Activity/ Functional Status: Patient is independent with mobility/ambulation, transfers, ADL's, IADL's.   Zubrod Score: At the time of surgery this patient's most appropriate activity status/level should be described as: []     0    Normal activity, no symptoms []     1    Restricted in physical strenuous activity but ambulatory, able to do out light work []     2    Ambulatory and capable of self care, unable to do work activities, up and about                 more than 50%  Of the time                            []     3    Only  limited self care, in bed greater than 50% of waking hours []     4    Completely disabled, no self care, confined to bed or chair []     5    Moribund  Past Medical History:  Diagnosis Date  . Asthma   . Elevated liver enzymes    PMH of  on Pravachol  . Hyperglycemia    A1c 5.2% in 2010  . Hyperlipidemia     Past Surgical History:  Procedure Laterality Date  . APPENDECTOMY    . LEFT HEART CATH AND CORONARY ANGIOGRAPHY N/A 03/16/2018   Procedure: LEFT HEART CATH AND CORONARY ANGIOGRAPHY;  Surgeon: Wellington Hampshire, MD;  Location: [facility];  Service: Cardiovascular;  Laterality: N/A;  . TONSILLECTOMY    . VASECTOMY    . WISDOM TOOTH EXTRACTION      Social History   Tobacco Use  Smoking Status Never Smoker  Smokeless Tobacco Never Used    Social History   Substance and Sexual Activity  Alcohol Use Yes  . Alcohol/week: 2.0 -  3.0 standard drinks  . Types: 2 - 3 Cans of beer per week   Comment: 2-3 beers daily     Allergies  Allergen Reactions  . Pravastatin Other (See Comments)    04/03/2009: AST 51 and ALT 101 on pravastatin 20 mg daily. LDL at that time was 192.6.    Current Facility-Administered Medications  Medication Dose Route Frequency Provider Last Rate Last Dose  . 0.9 %  sodium chloride infusion   Intravenous Continuous Arida, Muhammad A, MD      . 0.9 %  sodium chloride infusion  250 mL Intravenous PRN Wellington Hampshire, MD      . acetaminophen (TYLENOL) tablet 1,000 mg  1,000 mg Oral Daily PRN Kathlyn Sacramento A, MD      . albuterol (PROVENTIL) (2.5 MG/3ML) 0.083% nebulizer solution 2.5 mg  2.5 mg Inhalation Q6H PRN Wellington Hampshire, MD      . Derrill Memo ON 03/17/2018] aspirin EC tablet 81 mg  81 mg Oral Q1200 Arida, Muhammad A, MD      . carvedilol (COREG) tablet 3.125 mg  3.125 mg Oral BID WC Arida, Muhammad A, MD      . heparin ADULT infusion 100 units/mL (25000 units/233mL sodium chloride 0.45%)  1,000 Units/hr Intravenous Continuous Romona Curls, RPH      . irbesartan (AVAPRO) tablet 150 mg  150 mg Oral Daily Kathlyn Sacramento A, MD      . nitroGLYCERIN (NITROSTAT) SL tablet 0.4 mg  0.4 mg Sublingual Q5 min PRN Wellington Hampshire, MD      . ondansetron (ZOFRAN) injection 4 mg  4 mg Intravenous Q6H PRN Kathlyn Sacramento A, MD      . sodium chloride flush (NS) 0.9 % injection 3 mL  3 mL Intravenous Q12H Arida, Muhammad A, MD      . sodium chloride flush (NS) 0.9 % injection 3 mL  3 mL Intravenous PRN Wellington Hampshire, MD        Medications Prior to Admission  Medication Sig Dispense Refill Last Dose  . acetaminophen (TYLENOL) 500 MG tablet Take 1,000 mg by mouth daily as needed for moderate pain or headache.   03/15/2018 at Unknown time  . albuterol (PROVENTIL HFA;VENTOLIN HFA) 108 (90 Base) MCG/ACT inhaler Inhale 2 puffs into the lungs every 6 (six) hours as needed for wheezing or shortness of breath. 1 Inhaler 3 03/15/2018 at Unknown time  . aspirin 81 MG tablet Take 81 mg by mouth daily at 12 noon.    03/16/2018 at Unknown time  . montelukast (SINGULAIR) 10 MG tablet TAKE 1 TABLET (10 MG TOTAL) BY MOUTH AT BEDTIME. MUST KEEP JAN APP FOR MORE (Patient taking differently: Take 10 mg by mouth at bedtime as needed (allergies). ) 90 tablet 1 03/15/2018 at Unknown time  . nitroGLYCERIN (NITROSTAT) 0.4 MG SL tablet Place 1 tablet (0.4 mg total) under the tongue every 5 (five) minutes as needed for chest pain. 25 tablet 11 Taking  . telmisartan (MICARDIS) 20 MG tablet Take 1 tablet (20 mg total) by mouth daily. (Patient taking differently: Take 20 mg by mouth at bedtime. ) 30 tablet 0 03/15/2018 at Unknown time  . Evolocumab (REPATHA SURECLICK) 937 MG/ML SOAJ Inject 140 mg into the skin every 14 (fourteen) days.     . Pitavastatin Calcium (LIVALO) 2 MG TABS Take 1 tablet (2 mg total) by mouth daily. 30 tablet 3     Family History  Problem Relation Age of Onset  .  Heart attack Father        pre 97  . Diabetes Sister        DM 1  . Heart attack  Paternal Grandfather         in 54s  . Gout Paternal Grandfather   . Cancer Paternal Uncle 41       bone   . Mental illness Maternal Grandmother        "breakdown"  . Healthy Mother   . Autoimmune disease Mother   . Stroke Paternal Grandmother    Review of Systems:   ROS     Cardiac Review of Systems: Y or  [    ]= no  Chest Pain [ Y, exertional   ]  Resting SOB [   ] Exertional SOB  [Y  ]  Orthopnea [  ]   Pedal Edema [ N  ]    Palpitations [ N ] Syncope  [  ]   Presyncope [   ]  General Review of Systems: [Y] = yes [  ]=no Constitional: recent weight change [  ]; anorexia [  ]; fatigue [Y  ]; nausea [  ]; night sweats [ N ]; fever [  ]; or chills [  ]                                                               Dental: Last Dentist visit:   Eye : blurred vision [  ]; diplopia [   ]; vision changes [  ];  Amaurosis fugax[  ]; Resp: cough [ N ];  wheezing[ N ];  hemoptysis[  ]; shortness of breath[ N ]; paroxysmal nocturnal dyspnea[  ]; dyspnea on exertion[Y  ]; or orthopnea[  ];  GI:  gallstones[  ], vomiting[  ];  dysphagia[  ]; melena[  ];  hematochezia [  ]; heartburn[  ];   Hx of  Colonoscopy[  ]; GU: kidney stones [  ]; hematuria[  ];   dysuria [  ];  nocturia[  ];  history of     obstruction [  ]; urinary frequency [  ]             Skin: rash, swelling[ N ];, hair loss[  ];  peripheral edema[ N ];  or itching[  ]; Musculosketetal: myalgias[  ];  joint swelling[  ];  joint erythema[  ];  joint pain[  ];  back pain[  ];  Heme/Lymph: bruising[  ];  bleeding[  ];  anemia[  ];  Neuro: TIA[  ];  headaches[  ];  stroke[  ];  vertigo[  ];  seizures[  ];   paresthesias[  ];  difficulty walking[  ];  Psych:depression[ N ]; anxiety[ N ];  Endocrine: diabetes[N ];  thyroid dysfunction[ N ];  Physical Exam: BP (!) 165/103   Pulse 71   Temp 98.3 F (36.8 C) (Oral)   Resp (!) 22   Ht 6\' 1"  (1.854 m)   Wt 82.6 kg   SpO2 99%   BMI 24.01 kg/m    General appearance: alert,  cooperative and no distress Neck: no adenopathy, no carotid bruit, no JVD, supple, symmetrical, trachea midline and thyroid not enlarged, symmetric, no tenderness/mass/nodules Resp: clear to auscultation bilaterally Cardio:  regular rate and rhythm, S1, S2 normal, no murmur, click, rub or gallop GI: soft, non-tender; bowel sounds normal; no masses,  no organomegaly Extremities: extremities normal, atraumatic, no cyanosis or edema Neurologic: Grossly normal  Diagnostic Studies & Laboratory data:     Recent Radiology Findings:   No results found.   I have independently reviewed the above radiologic studies and discussed with the patient   Recent Lab Findings: Lab Results  Component Value Date   WBC 7.6 03/15/2018   HGB 15.9 03/15/2018   HCT 44.6 03/15/2018   PLT 284 03/15/2018   GLUCOSE 108 (H) 03/15/2018   CHOL 239 (H) 01/25/2018   TRIG 87 01/25/2018   HDL 68 01/25/2018   LDLDIRECT 205.1 12/26/2012   LDLCALC 154 (H) 01/25/2018   ALT 36 01/25/2018   AST 27 01/25/2018   NA 138 03/15/2018   K 4.8 03/15/2018   CL 106 03/15/2018   CREATININE 0.96 03/15/2018   BUN 13 03/15/2018   CO2 27 03/15/2018   TSH 3.69 01/15/2014   HGBA1C 5.4 01/02/2013   Assessment / Plan:      1. CAD- severe 2 vessel disease, preserved EF, possible CABG Monday 2. Hyperlipidemia- intolerant of statins, possible for PCSK9 inhibitor after surgery 3. HTN- on Coreg, Avapro currently 4. Dispo- patient stable, chest pain free currently, Dr. Cyndia Bent to evaluate later this afternoon.  Tentative for CABG Monday  I  spent 60 minutes counseling the patient face to face.   Ellwood Handler, PA-C  03/16/2018 2:03 PM

## 2018-03-16 NOTE — Progress Notes (Signed)
*  PRELIMINARY RESULTS* Echocardiogram 2D Echocardiogram has been performed.  Leavy Cella 03/16/2018, 3:57 PM

## 2018-03-17 ENCOUNTER — Other Ambulatory Visit (HOSPITAL_COMMUNITY): Payer: 59

## 2018-03-17 DIAGNOSIS — I200 Unstable angina: Secondary | ICD-10-CM

## 2018-03-17 LAB — CBC
HEMATOCRIT: 44.2 % (ref 39.0–52.0)
Hemoglobin: 15.2 g/dL (ref 13.0–17.0)
MCH: 31 pg (ref 26.0–34.0)
MCHC: 34.4 g/dL (ref 30.0–36.0)
MCV: 90 fL (ref 80.0–100.0)
NRBC: 0 % (ref 0.0–0.2)
Platelets: 274 10*3/uL (ref 150–400)
RBC: 4.91 MIL/uL (ref 4.22–5.81)
RDW: 12.3 % (ref 11.5–15.5)
WBC: 7.9 10*3/uL (ref 4.0–10.5)

## 2018-03-17 LAB — HEPARIN LEVEL (UNFRACTIONATED)
Heparin Unfractionated: 0.26 IU/mL — ABNORMAL LOW (ref 0.30–0.70)
Heparin Unfractionated: 0.47 IU/mL (ref 0.30–0.70)

## 2018-03-17 LAB — SURGICAL PCR SCREEN
MRSA, PCR: NEGATIVE
STAPHYLOCOCCUS AUREUS: POSITIVE — AB

## 2018-03-17 MED ORDER — PLASMA-LYTE 148 IV SOLN
INTRAVENOUS | Status: DC
  Filled 2018-03-17: qty 2.5

## 2018-03-17 MED ORDER — PHENYLEPHRINE HCL-NACL 20-0.9 MG/250ML-% IV SOLN
30.0000 ug/min | INTRAVENOUS | Status: DC
  Administered 2018-03-19: 30 ug/min via INTRAVENOUS
  Filled 2018-03-17: qty 250

## 2018-03-17 MED ORDER — INSULIN REGULAR(HUMAN) IN NACL 100-0.9 UT/100ML-% IV SOLN
INTRAVENOUS | Status: DC
  Administered 2018-03-19: 1 [IU]/h via INTRAVENOUS
  Filled 2018-03-17: qty 100

## 2018-03-17 MED ORDER — VANCOMYCIN HCL 10 G IV SOLR
1250.0000 mg | INTRAVENOUS | Status: DC
  Administered 2018-03-19: 1250 mg via INTRAVENOUS
  Filled 2018-03-17: qty 1250

## 2018-03-17 MED ORDER — TRANEXAMIC ACID (OHS) PUMP PRIME SOLUTION
2.0000 mg/kg | INTRAVENOUS | Status: DC
  Filled 2018-03-17: qty 1.56

## 2018-03-17 MED ORDER — SODIUM CHLORIDE 0.9 % IV SOLN
INTRAVENOUS | Status: DC
  Filled 2018-03-17: qty 30

## 2018-03-17 MED ORDER — TRANEXAMIC ACID 1000 MG/10ML IV SOLN
1.5000 mg/kg/h | INTRAVENOUS | Status: DC
  Administered 2018-03-19: 1.5 mg/kg/h via INTRAVENOUS
  Filled 2018-03-17: qty 25

## 2018-03-17 MED ORDER — POTASSIUM CHLORIDE 2 MEQ/ML IV SOLN
80.0000 meq | INTRAVENOUS | Status: DC
  Filled 2018-03-17: qty 40

## 2018-03-17 MED ORDER — NOREPINEPHRINE-SODIUM CHLORIDE 4-0.9 MG/250ML-% IV SOLN
0.0000 ug/min | INTRAVENOUS | Status: DC
  Filled 2018-03-17: qty 250

## 2018-03-17 MED ORDER — DEXMEDETOMIDINE HCL IN NACL 400 MCG/100ML IV SOLN
0.1000 ug/kg/h | INTRAVENOUS | Status: DC
  Administered 2018-03-19: .5 ug/kg/h via INTRAVENOUS
  Filled 2018-03-17: qty 100

## 2018-03-17 MED ORDER — MUPIROCIN 2 % EX OINT
1.0000 "application " | TOPICAL_OINTMENT | Freq: Two times a day (BID) | CUTANEOUS | Status: DC
  Administered 2018-03-17 – 2018-03-18 (×3): 1 via NASAL
  Filled 2018-03-17 (×2): qty 22

## 2018-03-17 MED ORDER — EPINEPHRINE PF 1 MG/ML IJ SOLN
0.0000 ug/min | INTRAVENOUS | Status: DC
  Filled 2018-03-17: qty 4

## 2018-03-17 MED ORDER — DOPAMINE-DEXTROSE 3.2-5 MG/ML-% IV SOLN
0.0000 ug/kg/min | INTRAVENOUS | Status: DC
  Filled 2018-03-17: qty 250

## 2018-03-17 MED ORDER — NITROGLYCERIN IN D5W 200-5 MCG/ML-% IV SOLN
2.0000 ug/min | INTRAVENOUS | Status: DC
  Filled 2018-03-17: qty 250

## 2018-03-17 MED ORDER — SODIUM CHLORIDE 0.9 % IV SOLN
1.5000 g | INTRAVENOUS | Status: DC
  Administered 2018-03-19: 1.5 g via INTRAVENOUS
  Filled 2018-03-17: qty 1.5

## 2018-03-17 MED ORDER — SODIUM CHLORIDE 0.9 % IV SOLN
750.0000 mg | INTRAVENOUS | Status: DC
  Filled 2018-03-17: qty 750

## 2018-03-17 MED ORDER — TRANEXAMIC ACID (OHS) BOLUS VIA INFUSION
15.0000 mg/kg | INTRAVENOUS | Status: DC
  Administered 2018-03-19: 1173 mg via INTRAVENOUS
  Filled 2018-03-17: qty 1173

## 2018-03-17 MED ORDER — MAGNESIUM SULFATE 50 % IJ SOLN
40.0000 meq | INTRAMUSCULAR | Status: DC
  Filled 2018-03-17: qty 9.85

## 2018-03-17 MED ORDER — MILRINONE LACTATE IN DEXTROSE 20-5 MG/100ML-% IV SOLN
0.3000 ug/kg/min | INTRAVENOUS | Status: DC
  Filled 2018-03-17: qty 100

## 2018-03-17 NOTE — Progress Notes (Signed)
ANTICOAGULATION CONSULT NOTE  Pharmacy Consult for heparin Indication: CP/ACS  Heparin Dosing Weight: 82.6 kg  Labs: Recent Labs    03/15/18 0945 03/17/18 0223  HGB 15.9 15.2  HCT 44.6 44.2  PLT 284 274  HEPARINUNFRC  --  0.26*  CREATININE 0.96  --     Assessment: 68 yom admitted with CP, now s/p cath 1/10 found to have severe two-vessel. Heparin restarted post cath and CVTS consulted. Probable CABG on Monday.  Heparin level slightly subtherapeutic (0.26) on gtt at 1000 units/hr. No issues with line or bleeding reported per RN.  Goal of Therapy:  Heparin level 0.3-0.7 units/ml Monitor platelets by anticoagulation protocol: Yes   Plan:  Increase heparin to 1150 units/hr 6h heparin level Monitor daily heparin level and CBC, s/sx bleeding  Sherlon Handing, PharmD, BCPS Clinical pharmacist  **Pharmacist phone directory can now be found on [email or web address] ([email or web address]).  Listed under [place]. 03/17/2018 3:16 AM

## 2018-03-17 NOTE — Progress Notes (Signed)
CARDIAC REHAB PHASE I   PRE:  Rate/Rhythm: 76 SR  BP:  Supine:   Sitting: 115/88   Standing:    SaO2:   MODE:  Ambulation: 1000 ft   POST:  Rate/Rhythm: 96 SR  BP:  Supine:   Sitting: 150/100  Standing:    SaO2: 0940-1040 Pt tolerated ambulation well without c/o of pain or SOB. BP after walk 150/100. Completed pre-op education with pt. Gave him going for heart surgery booklet and pt care guide. Encouraged him to watch going for heart surgery video. Gave pt IS and instructed in the proper use. We discussed sternal precautions, activity after surgery and a pain control after surgery. He voices understanding. Pt states that he will have 24/7 care at home for a week after surgery.  Rodney Langton RN 03/17/2018 10:54 AM

## 2018-03-17 NOTE — Progress Notes (Signed)
Progress Note  Patient Name: Tim Strickland Date of Encounter: 03/17/2018  Primary Cardiologist:   No primary care provider on file.   Subjective   No chest pain.  No SOB.    Inpatient Medications    Scheduled Meds: . aspirin EC  81 mg Oral Q1200  . carvedilol  3.125 mg Oral BID WC  . feeding supplement (ENSURE ENLIVE)  237 mL Oral BID BM  . irbesartan  150 mg Oral Daily  . mupirocin ointment  1 application Nasal BID  . sodium chloride flush  3 mL Intravenous Q12H   Continuous Infusions: . sodium chloride    . heparin 1,150 Units/hr (03/17/18 0330)   PRN Meds: sodium chloride, acetaminophen, albuterol, nitroGLYCERIN, ondansetron (ZOFRAN) IV, sodium chloride flush   Vital Signs    Vitals:   03/16/18 1701 03/16/18 2143 03/17/18 0456 03/17/18 0917  BP: (!) 132/98 (!) 132/97 (!) 132/98 115/88  Pulse: 69 67 64 78  Resp:  18 19   Temp:  98.1 F (36.7 C) 98.5 F (36.9 C)   TempSrc:  Oral Oral   SpO2:  96% 98%   Weight:   78.2 kg   Height:        Intake/Output Summary (Last 24 hours) at 03/17/2018 1257 Last data filed at 03/17/2018 0330 Gross per 24 hour  Intake 276.79 ml  Output 500 ml  Net -223.21 ml   Filed Weights   03/16/18 0837 03/17/18 0456  Weight: 82.6 kg 78.2 kg    Telemetry    NSR - Personally Reviewed  ECG    NA - Personally Reviewed  Physical Exam   GEN: No acute distress.   Neck: No  JVD Cardiac: RRR, no murmurs, rubs, or gallops.  Respiratory: Clear  to auscultation bilaterally. GI: Soft, nontender, non-distended  MS: No  edema; No deformity. Neuro:  Nonfocal  Psych: Normal affect   Labs    Chemistry Recent Labs  Lab 03/15/18 0945  NA 138  K 4.8  CL 106  CO2 27  GLUCOSE 108*  BUN 13  CREATININE 0.96  CALCIUM 9.6  GFRNONAA 91  GFRAA 105     Hematology Recent Labs  Lab 03/15/18 0945 03/17/18 0223  WBC 7.6 7.9  RBC 4.93 4.91  HGB 15.9 15.2  HCT 44.6 44.2  MCV 91 90.0  MCH 32.3 31.0  MCHC 35.7 34.4  RDW  13.6 12.3  PLT 284 274    Cardiac EnzymesNo results for input(s): TROPONINI in the last 168 hours. No results for input(s): TROPIPOC in the last 168 hours.   BNPNo results for input(s): BNP, PROBNP in the last 168 hours.   DDimer No results for input(s): DDIMER in the last 168 hours.   Radiology    No results found.  Cardiac Studies   ECHO  - Left ventricle: The cavity size was normal. There was mild   concentric hypertrophy. Systolic function was at the lower limits   of normal. The estimated ejection fraction was in the range of   50% to 55%. Hypokinesis of the apicallateral and apical   myocardium; consistent with ischemia in the distribution of the   left anterior descending coronary artery. Left ventricular   diastolic function parameters were normal.   CATH  The left ventricular systolic function is normal.  LV end diastolic pressure is normal.  The left ventricular ejection fraction is 55-65% by visual estimate.  Prox RCA lesion is 40% stenosed.  Dist RCA lesion is 65% stenosed.  Post Atrio lesion is 40% stenosed.  Prox Cx lesion is 40% stenosed.  Ost LAD lesion is 90% stenosed.  Prox LAD-1 lesion is 99% stenosed.  Prox LAD-2 lesion is 50% stenosed. Ost 1st Diag to 1st Diag lesion is 50% stenosed.  Patient Profile     53 y.o. male with a hx of angina and abnormal cardiac CTA last seen 01/25/18.  Assessment & Plan    CAD:  Results as above.   CABG planned.  I reviewed the films with the patient and his wife.    HTN: BP OK.  Continue current therapy.    DYSLIPIDEMIA:  He is intolerant of statin and the plan is to start PCSK9 as an outpatient.    For questions or updates, please contact [place] Please consult [email or web address] for contact info under Cardiology/STEMI.   Signed, Minus Breeding, MD  03/17/2018, 12:57 PM

## 2018-03-17 NOTE — Progress Notes (Signed)
ANTICOAGULATION CONSULT NOTE  Pharmacy Consult for heparin Indication: CP/ACS  Heparin Dosing Weight: 82.6 kg  Labs: Recent Labs    03/15/18 0945 03/17/18 0223 03/17/18 0855  HGB 15.9 15.2  --   HCT 44.6 44.2  --   PLT 284 274  --   HEPARINUNFRC  --  0.26* 0.47  CREATININE 0.96  --   --     Assessment: 63 yom admitted with CP, now s/p cath 1/10 found to have severe two-vessel. Heparin restarted post cath and CVTS consulted. Probable CABG on Monday.  Heparin level therapeutic (0.47) after rate increase. No issues with bleeding documented.  Goal of Therapy:  Heparin level 0.3-0.7 units/ml Monitor platelets by anticoagulation protocol: Yes   Plan:  Continue heparin at 1150 units/hr Monitor daily heparin level and CBC, s/sx bleeding Planning CABG tentatively Monday  Elicia Lamp, PharmD, BCPS Clinical Pharmacist Clinical phone [phone] Please check AMION for all [place] contact numbers 03/17/2018 10:58 AM

## 2018-03-17 NOTE — Progress Notes (Signed)
Nutrition Brief Note  Patient identified on the Malnutrition Screening Tool (MST) Report. Patient reports minor weight fluctuations over the past few months. Weight changes have not been significant. He has been eating well with no changes in his diet.   Nutrition focused physical exam completed.  No muscle or subcutaneous fat depletion noticed.  Plans for CABG on Monday.  Wt Readings from Last 15 Encounters:  03/17/18 78.2 kg  03/15/18 82.9 kg  01/25/18 80.6 kg  01/09/18 80.3 kg  11/24/17 82.1 kg  03/29/17 81.6 kg  11/04/16 81.6 kg  06/21/16 80.9 kg  04/26/16 80.7 kg  02/23/16 78.9 kg  01/06/15 80.3 kg  02/05/14 83.5 kg  01/27/14 83.2 kg  01/15/14 83.9 kg  01/07/14 84.9 kg    Body mass index is 22.76 kg/m. Patient meets criteria for normal weight based on current BMI.   Current diet order is heart healthy, patient is consuming approximately 75-100% of meals at this time. Labs and medications reviewed.   No nutrition interventions warranted at this time. If nutrition issues arise, please consult RD.   Molli Barrows, RD, LDN, [place] Pager [phone] After Hours Pager [phone]

## 2018-03-18 ENCOUNTER — Inpatient Hospital Stay (HOSPITAL_COMMUNITY): Payer: 59

## 2018-03-18 DIAGNOSIS — I10 Essential (primary) hypertension: Secondary | ICD-10-CM

## 2018-03-18 DIAGNOSIS — Z01810 Encounter for preprocedural cardiovascular examination: Secondary | ICD-10-CM

## 2018-03-18 HISTORY — DX: Essential (primary) hypertension: I10

## 2018-03-18 LAB — HEPARIN LEVEL (UNFRACTIONATED): Heparin Unfractionated: 0.47 IU/mL (ref 0.30–0.70)

## 2018-03-18 LAB — BLOOD GAS, ARTERIAL
Acid-Base Excess: 1 mmol/L (ref 0.0–2.0)
Bicarbonate: 24.9 mmol/L (ref 20.0–28.0)
Drawn by: 244801
FIO2: 21
O2 Saturation: 96.9 %
PATIENT TEMPERATURE: 98.6
pCO2 arterial: 38.2 mmHg (ref 32.0–48.0)
pH, Arterial: 7.429 (ref 7.350–7.450)
pO2, Arterial: 89.1 mmHg (ref 83.0–108.0)

## 2018-03-18 LAB — URINALYSIS, ROUTINE W REFLEX MICROSCOPIC
Bacteria, UA: NONE SEEN
Bilirubin Urine: NEGATIVE
Glucose, UA: NEGATIVE mg/dL
Ketones, ur: NEGATIVE mg/dL
Leukocytes, UA: NEGATIVE
Nitrite: NEGATIVE
Protein, ur: NEGATIVE mg/dL
Specific Gravity, Urine: 1.011 (ref 1.005–1.030)
pH: 7 (ref 5.0–8.0)

## 2018-03-18 LAB — APTT: aPTT: 85 seconds — ABNORMAL HIGH (ref 24–36)

## 2018-03-18 LAB — COMPREHENSIVE METABOLIC PANEL
ALT: 50 U/L — ABNORMAL HIGH (ref 0–44)
AST: 32 U/L (ref 15–41)
Albumin: 4.2 g/dL (ref 3.5–5.0)
Alkaline Phosphatase: 52 U/L (ref 38–126)
Anion gap: 10 (ref 5–15)
BUN: 12 mg/dL (ref 6–20)
CO2: 25 mmol/L (ref 22–32)
Calcium: 9.7 mg/dL (ref 8.9–10.3)
Chloride: 102 mmol/L (ref 98–111)
Creatinine, Ser: 1.04 mg/dL (ref 0.61–1.24)
GFR calc Af Amer: >60 mL/min (ref >60)
Glucose, Bld: 103 mg/dL — ABNORMAL HIGH (ref 70–99)
Potassium: 4.1 mmol/L (ref 3.5–5.1)
Sodium: 137 mmol/L (ref 135–145)
Total Bilirubin: 1 mg/dL (ref 0.3–1.2)
Total Protein: 7.2 g/dL (ref 6.5–8.1)

## 2018-03-18 LAB — CBC
HCT: 45.8 % (ref 39.0–52.0)
Hemoglobin: 16.1 g/dL (ref 13.0–17.0)
MCH: 31.9 pg (ref 26.0–34.0)
MCHC: 35.2 g/dL (ref 30.0–36.0)
MCV: 90.9 fL (ref 80.0–100.0)
Platelets: 284 10*3/uL (ref 150–400)
RBC: 5.04 MIL/uL (ref 4.22–5.81)
RDW: 12.5 % (ref 11.5–15.5)
WBC: 8.9 10*3/uL (ref 4.0–10.5)
nRBC: 0 % (ref 0.0–0.2)

## 2018-03-18 LAB — PROTIME-INR
INR: 0.93
Prothrombin Time: 12.4 seconds (ref 11.4–15.2)

## 2018-03-18 LAB — TYPE AND SCREEN
ABO/RH(D): O NEG
Antibody Screen: NEGATIVE

## 2018-03-18 LAB — HEMOGLOBIN A1C
Hgb A1c MFr Bld: 5.5 % (ref 4.8–5.6)
Mean Plasma Glucose: 111.15 mg/dL

## 2018-03-18 LAB — ABO/RH: ABO/RH(D): O NEG

## 2018-03-18 MED ORDER — CHLORHEXIDINE GLUCONATE CLOTH 2 % EX PADS
6.0000 | MEDICATED_PAD | Freq: Once | CUTANEOUS | Status: AC
  Administered 2018-03-18: 6 via TOPICAL

## 2018-03-18 MED ORDER — EZETIMIBE 10 MG PO TABS
10.0000 mg | ORAL_TABLET | Freq: Every day | ORAL | Status: DC
  Administered 2018-03-18 – 2018-03-23 (×5): 10 mg via ORAL
  Filled 2018-03-18 (×5): qty 1

## 2018-03-18 MED ORDER — TEMAZEPAM 15 MG PO CAPS: 15.0000 mg | ORAL_CAPSULE | Freq: Once | ORAL | Status: DC | PRN

## 2018-03-18 MED ORDER — CHLORHEXIDINE GLUCONATE CLOTH 2 % EX PADS: 6.0000 | MEDICATED_PAD | Freq: Once | CUTANEOUS | Status: DC

## 2018-03-18 MED ORDER — BISACODYL 5 MG PO TBEC
5.0000 mg | DELAYED_RELEASE_TABLET | Freq: Once | ORAL | Status: DC
  Filled 2018-03-18: qty 1

## 2018-03-18 MED ORDER — CHLORHEXIDINE GLUCONATE 0.12 % MT SOLN
15.0000 mL | Freq: Once | OROMUCOSAL | Status: AC
  Administered 2018-03-19: 15 mL via OROMUCOSAL
  Filled 2018-03-18: qty 15

## 2018-03-18 MED ORDER — METOPROLOL TARTRATE 12.5 MG HALF TABLET
12.5000 mg | ORAL_TABLET | Freq: Once | ORAL | Status: AC
  Administered 2018-03-19: 12.5 mg via ORAL
  Filled 2018-03-18: qty 1

## 2018-03-18 MED ORDER — DIAZEPAM 5 MG PO TABS
5.0000 mg | ORAL_TABLET | Freq: Once | ORAL | Status: AC
  Administered 2018-03-19: 5 mg via ORAL
  Filled 2018-03-18: qty 1

## 2018-03-18 NOTE — Progress Notes (Addendum)
Progress Note  Patient Name: Tim Strickland Date of Encounter: 03/18/2018  Primary Cardiologist:   Shirlee More, MD   Subjective   Looking forward to getting the surgery over with.  No chest pain or shortness of breath.  Inpatient Medications    Scheduled Meds: . aspirin EC  81 mg Oral Q1200  . carvedilol  3.125 mg Oral BID WC  . feeding supplement (ENSURE ENLIVE)  237 mL Oral BID BM  . [START ON 03/19/2018] heparin-papaverine-plasmalyte irrigation   Irrigation To OR  . [START ON 03/19/2018] insulin   Intravenous To OR  . irbesartan  150 mg Oral Daily  . [START ON 03/19/2018] magnesium sulfate  40 mEq Other To OR  . mupirocin ointment  1 application Nasal BID  . [START ON 03/19/2018] norepinephrine  0-40 mcg/min Intravenous To OR  . [START ON 03/19/2018] phenylephrine  30-200 mcg/min Intravenous To OR  . [START ON 03/19/2018] potassium chloride  80 mEq Other To OR  . sodium chloride flush  3 mL Intravenous Q12H  . [START ON 03/19/2018] tranexamic acid  15 mg/kg Intravenous To OR  . [START ON 03/19/2018] tranexamic acid  2 mg/kg Intracatheter To OR   Continuous Infusions: . sodium chloride    . [START ON 03/19/2018] cefUROXime (ZINACEF)  IV    . [START ON 03/19/2018] cefUROXime (ZINACEF)  IV    . [START ON 03/19/2018] dexmedetomidine    . [START ON 03/19/2018] DOPamine    . [START ON 03/19/2018] epinephrine    . [START ON 03/19/2018] heparin 30,000 units/NS 1000 mL solution for CELLSAVER    . heparin 1,150 Units/hr (03/17/18 0330)  . [START ON 03/19/2018] milrinone    . [START ON 03/19/2018] nitroGLYCERIN    . [START ON 03/19/2018] tranexamic acid (CYKLOKAPRON) infusion ([facility])    . [START ON 03/19/2018] vancomycin     PRN Meds: sodium chloride, acetaminophen, albuterol, nitroGLYCERIN, ondansetron (ZOFRAN) IV, sodium chloride flush   Vital Signs    Vitals:   03/17/18 0917 03/17/18 1424 03/17/18 2021 03/18/18 0425  BP: 115/88 (!) 123/93 118/86 (!) 123/96  Pulse: 78 63 76 77  Resp:    20 20  Temp:  97.9 F (36.6 C) 98.2 F (36.8 C) 97.9 F (36.6 C)  TempSrc:  Oral Oral Oral  SpO2:  98% 97% 98%  Weight:    77.8 kg  Height:        Intake/Output Summary (Last 24 hours) at 03/18/2018 0853 Last data filed at 03/18/2018 0800 Gross per 24 hour  Intake 389.52 ml  Output 1075 ml  Net -685.48 ml   Filed Weights   03/16/18 0837 03/17/18 0456 03/18/18 0425  Weight: 82.6 kg 78.2 kg 77.8 kg    Telemetry    Sinus rhythm, no significant ectopy- Personally Reviewed  ECG    None today- Personally Reviewed  Physical Exam   General: Well developed, well nourished, male in no acute distress Head: Eyes PERRLA, No xanthomas.   Normocephalic and atraumatic Lungs: Clear bilaterally to auscultation. Heart: HRRR S1 S2, without MRG.  Pulses are 2+ & equal. No JVD. Abdomen: Bowel sounds are present, abdomen soft and non-tender without masses or  hernias noted. Msk: Normal strength and tone for age. Extremities: No clubbing, cyanosis or edema.    Skin:  No rashes or lesions noted. Neuro: Alert and oriented X 3. Psych:  Good affect, responds appropriately   Labs    Chemistry Recent Labs  Lab 03/15/18 0945 03/18/18 0336  NA 138  137  K 4.8 4.1  CL 106 102  CO2 27 25  GLUCOSE 108* 103*  BUN 13 12  CREATININE 0.96 1.04  CALCIUM 9.6 9.7  PROT  --  7.2  ALBUMIN  --  4.2  AST  --  32  ALT  --  50*  ALKPHOS  --  52  BILITOT  --  1.0  GFRNONAA 91 >60  GFRAA 105 >60  ANIONGAP  --  10     Hematology Recent Labs  Lab 03/15/18 0945 03/17/18 0223 03/18/18 0336  WBC 7.6 7.9 8.9  RBC 4.93 4.91 5.04  HGB 15.9 15.2 16.1  HCT 44.6 44.2 45.8  MCV 91 90.0 90.9  MCH 32.3 31.0 31.9  MCHC 35.7 34.4 35.2  RDW 13.6 12.3 12.5  PLT 284 274 284    Lab Results  Component Value Date   CHOL 239 (H) 01/25/2018   HDL 68 01/25/2018   LDLCALC 154 (H) 01/25/2018   LDLDIRECT 205.1 12/26/2012   TRIG 87 01/25/2018   CHOLHDL 3.5 01/25/2018     Radiology    No results  found.  Cardiac Studies   ECHO  - Left ventricle: The cavity size was normal. There was mild   concentric hypertrophy. Systolic function was at the lower limits   of normal. The estimated ejection fraction was in the range of   50% to 55%. Hypokinesis of the apicallateral and apical   myocardium; consistent with ischemia in the distribution of the   left anterior descending coronary artery. Left ventricular   diastolic function parameters were normal.   CATH  The left ventricular systolic function is normal.  LV end diastolic pressure is normal.  The left ventricular ejection fraction is 55-65% by visual estimate.  Prox RCA lesion is 40% stenosed.  Dist RCA lesion is 65% stenosed.  Post Atrio lesion is 40% stenosed.  Prox Cx lesion is 40% stenosed.  Ost LAD lesion is 90% stenosed.  Prox LAD-1 lesion is 99% stenosed.  Prox LAD-2 lesion is 50% stenosed. Ost 1st Diag to 1st Diag lesion is 50% stenosed.  Patient Profile     53 y.o. male with a hx of angina and abnormal cardiac CTA last seen 01/25/18.  Admitted 03/16/2018 for cath>> CABG planned 03/19/2018.  Assessment & Plan    1. CAD:  -CABG planned for Monday a.m. - On ASA and beta-blocker, not on statin due to allergy to pravastatin  2.  Hypertension: -SBP 110s -120s on current therapy  3.  Hyperlipidemia: -Last lipid profile as above. - Not on statin due to intolerance - Plan is for PCSK9 as outpatient, MD advise on adding Zetia here    For questions or updates, please contact [place] Please consult [email or web address] for contact info under Cardiology/STEMI.   Signed, Rosaria Ferries, PA-C  03/18/2018, 8:53 AM    History and all data above reviewed.  Patient examined.  I agree with the findings as above.  No chest pain.  No SOB. The patient exam reveals COR:RRR  ,  Lungs: Clear  ,  Abd: Positive bowel sounds, no rebound no guarding, Ext No edema  .  All available labs, radiology testing, previous  records reviewed. Agree with documented assessment and plan.  OK to start Zetia.  Post hospital he is to be followed in the lipid clinic.  CAD:  CABG tomorrow.    Jeneen Rinks Sallye Lunz  11:11 AM  03/18/2018

## 2018-03-18 NOTE — Progress Notes (Signed)
ANTICOAGULATION CONSULT NOTE  Pharmacy Consult for heparin Indication: CP/ACS  Heparin Dosing Weight: 82.6 kg  Labs: Recent Labs    03/17/18 0223 03/17/18 0855 03/18/18 0336  HGB 15.2  --  16.1  HCT 44.2  --  45.8  PLT 274  --  284  APTT  --   --  85*  LABPROT  --   --  12.4  INR  --   --  0.93  HEPARINUNFRC 0.26* 0.47 0.47  CREATININE  --   --  1.04    Assessment: 36 yom admitted with CP, now s/p cath 1/10 found to have severe two-vessel. Heparin restarted post cath and CVTS consulted. Probable CABG on Monday.  Heparin level remains therapeutic (0.47). No issues with bleeding documented.  Goal of Therapy:  Heparin level 0.3-0.7 units/ml Monitor platelets by anticoagulation protocol: Yes   Plan:  Continue heparin at 1150 units/hr Monitor daily heparin level and CBC, s/sx bleeding Planning CABG tentatively Monday  Elicia Lamp, PharmD, BCPS Clinical Pharmacist Clinical phone [phone] Please check AMION for all [place] contact numbers 03/18/2018 10:38 AM

## 2018-03-18 NOTE — Progress Notes (Signed)
VASCULAR LAB PRELIMINARY  PRELIMINARY  PRELIMINARY  PRELIMINARY  Pre CABG Dopplers completed.    Preliminary report:  1-39% ICA plaquing.  Vertebral artery flow is antegrade.  Triphasic lower extremity waveforms.  Right: Doppler waveforms remain normal with radial and ulnar compression. Left: Doppler waveforms remain normal with radial compression and diminish >50% with ulnar compression.    Ky Rumple, RVT 03/18/2018, 3:16 PM

## 2018-03-19 ENCOUNTER — Inpatient Hospital Stay (HOSPITAL_COMMUNITY)
Admission: RE | Disposition: A | Discharge status: Home / Self Care | Payer: Self-pay | Source: Home / Self Care | Attending: Cardiovascular Disease

## 2018-03-19 ENCOUNTER — Inpatient Hospital Stay (HOSPITAL_COMMUNITY): Payer: 59 | Admitting: Certified Registered Nurse Anesthetist

## 2018-03-19 ENCOUNTER — Inpatient Hospital Stay (HOSPITAL_COMMUNITY): Payer: 59

## 2018-03-19 HISTORY — PX: CORONARY ARTERY BYPASS GRAFT: SHX141

## 2018-03-19 HISTORY — PX: TEE WITHOUT CARDIOVERSION: SHX5443

## 2018-03-19 LAB — CBC
HCT: 47.2 % (ref 39.0–52.0)
HCT: 34.3 % — ABNORMAL LOW (ref 39.0–52.0)
HCT: 35.6 % — ABNORMAL LOW (ref 39.0–52.0)
Hemoglobin: 16.5 g/dL (ref 13.0–17.0)
Hemoglobin: 11.7 g/dL — ABNORMAL LOW (ref 13.0–17.0)
Hemoglobin: 12.4 g/dL — ABNORMAL LOW (ref 13.0–17.0)
MCH: 31.9 pg (ref 26.0–34.0)
MCH: 31 pg (ref 26.0–34.0)
MCH: 31.9 pg (ref 26.0–34.0)
MCHC: 35 g/dL (ref 30.0–36.0)
MCHC: 34.1 g/dL (ref 30.0–36.0)
MCHC: 34.8 g/dL (ref 30.0–36.0)
MCV: 91.1 fL (ref 80.0–100.0)
MCV: 91 fL (ref 80.0–100.0)
MCV: 91.5 fL (ref 80.0–100.0)
Platelets: 290 10*3/uL (ref 150–400)
Platelets: 188 10*3/uL (ref 150–400)
Platelets: 213 10*3/uL (ref 150–400)
RBC: 5.18 MIL/uL (ref 4.22–5.81)
RBC: 3.77 MIL/uL — ABNORMAL LOW (ref 4.22–5.81)
RBC: 3.89 MIL/uL — ABNORMAL LOW (ref 4.22–5.81)
RDW: 12.4 % (ref 11.5–15.5)
RDW: 12.3 % (ref 11.5–15.5)
RDW: 12.4 % (ref 11.5–15.5)
WBC: 8.6 10*3/uL (ref 4.0–10.5)
WBC: 16.6 10*3/uL — ABNORMAL HIGH (ref 4.0–10.5)
WBC: 16.6 10*3/uL — ABNORMAL HIGH (ref 4.0–10.5)
nRBC: 0 % (ref 0.0–0.2)
nRBC: 0 % (ref 0.0–0.2)
nRBC: 0 % (ref 0.0–0.2)

## 2018-03-19 LAB — POCT I-STAT, CHEM 8
BUN: 15 mg/dL (ref 6–20)
BUN: 16 mg/dL (ref 6–20)
BUN: 13 mg/dL (ref 6–20)
BUN: 14 mg/dL (ref 6–20)
BUN: 14 mg/dL (ref 6–20)
BUN: 16 mg/dL (ref 6–20)
CALCIUM ION: 1.06 mmol/L — AB (ref 1.15–1.40)
CHLORIDE: 104 mmol/L (ref 98–111)
CHLORIDE: 100 mmol/L (ref 98–111)
CHLORIDE: 102 mmol/L (ref 98–111)
CHLORIDE: 104 mmol/L (ref 98–111)
CREATININE: 0.9 mg/dL (ref 0.61–1.24)
Calcium, Ion: 1.29 mmol/L (ref 1.15–1.40)
Calcium, Ion: 1.21 mmol/L (ref 1.15–1.40)
Calcium, Ion: 1.1 mmol/L — ABNORMAL LOW (ref 1.15–1.40)
Calcium, Ion: 1.06 mmol/L — ABNORMAL LOW (ref 1.15–1.40)
Calcium, Ion: 1.14 mmol/L — ABNORMAL LOW (ref 1.15–1.40)
Chloride: 104 mmol/L (ref 98–111)
Chloride: 104 mmol/L (ref 98–111)
Creatinine, Ser: 0.8 mg/dL (ref 0.61–1.24)
Creatinine, Ser: 0.6 mg/dL — ABNORMAL LOW (ref 0.61–1.24)
Creatinine, Ser: 0.8 mg/dL (ref 0.61–1.24)
Creatinine, Ser: 0.7 mg/dL (ref 0.61–1.24)
Creatinine, Ser: 0.8 mg/dL (ref 0.61–1.24)
GLUCOSE: 117 mg/dL — AB (ref 70–99)
GLUCOSE: 161 mg/dL — AB (ref 70–99)
Glucose, Bld: 111 mg/dL — ABNORMAL HIGH (ref 70–99)
Glucose, Bld: 88 mg/dL (ref 70–99)
Glucose, Bld: 120 mg/dL — ABNORMAL HIGH (ref 70–99)
Glucose, Bld: 123 mg/dL — ABNORMAL HIGH (ref 70–99)
HCT: 42 % (ref 39.0–52.0)
HCT: 39 % (ref 39.0–52.0)
HCT: 29 % — ABNORMAL LOW (ref 39.0–52.0)
HCT: 33 % — ABNORMAL LOW (ref 39.0–52.0)
HCT: 29 % — ABNORMAL LOW (ref 39.0–52.0)
HCT: 33 % — ABNORMAL LOW (ref 39.0–52.0)
HEMOGLOBIN: 11.2 g/dL — AB (ref 13.0–17.0)
Hemoglobin: 14.3 g/dL (ref 13.0–17.0)
Hemoglobin: 13.3 g/dL (ref 13.0–17.0)
Hemoglobin: 11.2 g/dL — ABNORMAL LOW (ref 13.0–17.0)
Hemoglobin: 9.9 g/dL — ABNORMAL LOW (ref 13.0–17.0)
Hemoglobin: 9.9 g/dL — ABNORMAL LOW (ref 13.0–17.0)
POTASSIUM: 5.5 mmol/L — AB (ref 3.5–5.1)
Potassium: 4.1 mmol/L (ref 3.5–5.1)
Potassium: 4.5 mmol/L (ref 3.5–5.1)
Potassium: 4.7 mmol/L (ref 3.5–5.1)
Potassium: 4.5 mmol/L (ref 3.5–5.1)
Potassium: 4.5 mmol/L (ref 3.5–5.1)
Sodium: 136 mmol/L (ref 135–145)
Sodium: 136 mmol/L (ref 135–145)
Sodium: 135 mmol/L (ref 135–145)
Sodium: 135 mmol/L (ref 135–145)
Sodium: 134 mmol/L — ABNORMAL LOW (ref 135–145)
Sodium: 137 mmol/L (ref 135–145)
TCO2: 30 mmol/L (ref 22–32)
TCO2: 27 mmol/L (ref 22–32)
TCO2: 24 mmol/L (ref 22–32)
TCO2: 27 mmol/L (ref 22–32)
TCO2: 25 mmol/L (ref 22–32)
TCO2: 23 mmol/L (ref 22–32)

## 2018-03-19 LAB — HEPARIN LEVEL (UNFRACTIONATED): Heparin Unfractionated: 0.59 IU/mL (ref 0.30–0.70)

## 2018-03-19 LAB — APTT: aPTT: 32 s (ref 24–36)

## 2018-03-19 LAB — BASIC METABOLIC PANEL
Anion gap: 10 (ref 5–15)
BUN: 16 mg/dL (ref 6–20)
CALCIUM: 9.4 mg/dL (ref 8.9–10.3)
CO2: 25 mmol/L (ref 22–32)
Chloride: 102 mmol/L (ref 98–111)
Creatinine, Ser: 1.13 mg/dL (ref 0.61–1.24)
GFR calc Af Amer: >60 mL/min (ref >60)
GFR calc non Af Amer: >60 mL/min (ref >60)
Glucose, Bld: 110 mg/dL — ABNORMAL HIGH (ref 70–99)
Potassium: 4.4 mmol/L (ref 3.5–5.1)
Sodium: 137 mmol/L (ref 135–145)

## 2018-03-19 LAB — PLATELET COUNT: Platelets: 230 10*3/uL (ref 150–400)

## 2018-03-19 LAB — GLUCOSE, CAPILLARY
GLUCOSE-CAPILLARY: 134 mg/dL — AB (ref 70–99)
Glucose-Capillary: 104 mg/dL — ABNORMAL HIGH (ref 70–99)
Glucose-Capillary: 132 mg/dL — ABNORMAL HIGH (ref 70–99)
Glucose-Capillary: 125 mg/dL — ABNORMAL HIGH (ref 70–99)
Glucose-Capillary: 108 mg/dL — ABNORMAL HIGH (ref 70–99)
Glucose-Capillary: 110 mg/dL — ABNORMAL HIGH (ref 70–99)
Glucose-Capillary: 122 mg/dL — ABNORMAL HIGH (ref 70–99)
Glucose-Capillary: 121 mg/dL — ABNORMAL HIGH (ref 70–99)
Glucose-Capillary: 117 mg/dL — ABNORMAL HIGH (ref 70–99)

## 2018-03-19 LAB — POCT I-STAT 3, ART BLOOD GAS (G3+)
Acid-Base Excess: 1 mmol/L (ref 0.0–2.0)
Acid-base deficit: 1 mmol/L (ref 0.0–2.0)
Acid-base deficit: 5 mmol/L — ABNORMAL HIGH (ref 0.0–2.0)
Acid-base deficit: 3 mmol/L — ABNORMAL HIGH (ref 0.0–2.0)
Acid-base deficit: 5 mmol/L — ABNORMAL HIGH (ref 0.0–2.0)
Bicarbonate: 27.7 mmol/L (ref 20.0–28.0)
Bicarbonate: 23.9 mmol/L (ref 20.0–28.0)
Bicarbonate: 20.7 mmol/L (ref 20.0–28.0)
Bicarbonate: 22.7 mmol/L (ref 20.0–28.0)
Bicarbonate: 20.7 mmol/L (ref 20.0–28.0)
O2 Saturation: 100 %
O2 Saturation: 100 %
O2 Saturation: 100 %
O2 Saturation: 99 %
O2 Saturation: 99 %
PCO2 ART: 39.6 mmHg (ref 32.0–48.0)
Patient temperature: 36
Patient temperature: 37.6
TCO2: 29 mmol/L (ref 22–32)
TCO2: 25 mmol/L (ref 22–32)
TCO2: 22 mmol/L (ref 22–32)
TCO2: 24 mmol/L (ref 22–32)
TCO2: 22 mmol/L (ref 22–32)
pCO2 arterial: 50.6 mmHg — ABNORMAL HIGH (ref 32.0–48.0)
pCO2 arterial: 37.4 mmHg (ref 32.0–48.0)
pCO2 arterial: 41.8 mmHg (ref 32.0–48.0)
pCO2 arterial: 39.3 mmHg (ref 32.0–48.0)
pH, Arterial: 7.346 — ABNORMAL LOW (ref 7.350–7.450)
pH, Arterial: 7.389 (ref 7.350–7.450)
pH, Arterial: 7.347 — ABNORMAL LOW (ref 7.350–7.450)
pH, Arterial: 7.344 — ABNORMAL LOW (ref 7.350–7.450)
pH, Arterial: 7.332 — ABNORMAL LOW (ref 7.350–7.450)
pO2, Arterial: 455 mmHg — ABNORMAL HIGH (ref 83.0–108.0)
pO2, Arterial: 274 mmHg — ABNORMAL HIGH (ref 83.0–108.0)
pO2, Arterial: 200 mmHg — ABNORMAL HIGH (ref 83.0–108.0)
pO2, Arterial: 146 mmHg — ABNORMAL HIGH (ref 83.0–108.0)
pO2, Arterial: 165 mmHg — ABNORMAL HIGH (ref 83.0–108.0)

## 2018-03-19 LAB — ECHO INTRAOPERATIVE TEE
Height: 73 in
Weight: 2739.2 oz

## 2018-03-19 LAB — POCT I-STAT 4, (NA,K, GLUC, HGB,HCT)
Glucose, Bld: 91 mg/dL (ref 70–99)
HCT: 33 % — ABNORMAL LOW (ref 39.0–52.0)
Hemoglobin: 11.2 g/dL — ABNORMAL LOW (ref 13.0–17.0)
Potassium: 4.2 mmol/L (ref 3.5–5.1)
Sodium: 139 mmol/L (ref 135–145)

## 2018-03-19 LAB — CREATININE, SERUM
Creatinine, Ser: 0.91 mg/dL (ref 0.61–1.24)
GFR calc Af Amer: >60 mL/min (ref >60)
GFR calc non Af Amer: >60 mL/min (ref >60)

## 2018-03-19 LAB — PROTIME-INR
INR: 1.28
Prothrombin Time: 15.9 seconds — ABNORMAL HIGH (ref 11.4–15.2)

## 2018-03-19 LAB — MAGNESIUM: MAGNESIUM: 3.5 mg/dL — AB (ref 1.7–2.4)

## 2018-03-19 LAB — HEMOGLOBIN AND HEMATOCRIT, BLOOD
HCT: 32.2 % — ABNORMAL LOW (ref 39.0–52.0)
Hemoglobin: 11.5 g/dL — ABNORMAL LOW (ref 13.0–17.0)

## 2018-03-19 SURGERY — CORONARY ARTERY BYPASS GRAFTING (CABG)
Anesthesia: General | Site: Chest

## 2018-03-19 MED ORDER — ACETAMINOPHEN 160 MG/5ML PO SOLN: 650.0000 mg | Freq: Once | ORAL | Status: AC

## 2018-03-19 MED ORDER — PLASMA-LYTE 148 IV SOLN
INTRAVENOUS | Status: DC | PRN
  Administered 2018-03-19: 500 mL via INTRAVASCULAR

## 2018-03-19 MED ORDER — SODIUM CHLORIDE 0.9 % IV SOLN
INTRAVENOUS | Status: DC
  Administered 2018-03-19: 14:00:00 via INTRAVENOUS

## 2018-03-19 MED ORDER — THROMBIN 20000 UNITS EX SOLR
CUTANEOUS | Status: DC | PRN
  Administered 2018-03-19: 20000 [IU] via TOPICAL

## 2018-03-19 MED ORDER — HEMOSTATIC AGENTS (NO CHARGE) OPTIME
TOPICAL | Status: DC | PRN
  Administered 2018-03-19 (×4): 1 via TOPICAL

## 2018-03-19 MED ORDER — ASPIRIN 81 MG PO CHEW: 324.0000 mg | CHEWABLE_TABLET | Freq: Every day | ORAL | Status: DC

## 2018-03-19 MED ORDER — ROCURONIUM BROMIDE 50 MG/5ML IV SOSY
PREFILLED_SYRINGE | INTRAVENOUS | Status: AC
  Filled 2018-03-19: qty 5

## 2018-03-19 MED ORDER — ACETAMINOPHEN 160 MG/5ML PO SOLN: 1000.0000 mg | Freq: Four times a day (QID) | ORAL | Status: DC

## 2018-03-19 MED ORDER — TRAMADOL HCL 50 MG PO TABS: 50.0000 mg | ORAL_TABLET | ORAL | Status: DC | PRN

## 2018-03-19 MED ORDER — ONDANSETRON HCL 4 MG/2ML IJ SOLN
INTRAMUSCULAR | Status: DC | PRN
  Administered 2018-03-19: 4 mg via INTRAVENOUS

## 2018-03-19 MED ORDER — BISACODYL 10 MG RE SUPP: 10.0000 mg | Freq: Every day | RECTAL | Status: DC

## 2018-03-19 MED ORDER — PROTAMINE SULFATE 10 MG/ML IV SOLN
INTRAVENOUS | Status: DC | PRN
  Administered 2018-03-19: 270 mg via INTRAVENOUS

## 2018-03-19 MED ORDER — ONDANSETRON HCL 4 MG/2ML IJ SOLN
INTRAMUSCULAR | Status: AC
  Filled 2018-03-19: qty 2

## 2018-03-19 MED ORDER — MIDAZOLAM HCL 2 MG/2ML IJ SOLN: 2.0000 mg | INTRAMUSCULAR | Status: DC | PRN

## 2018-03-19 MED ORDER — ROCURONIUM BROMIDE 10 MG/ML (PF) SYRINGE
PREFILLED_SYRINGE | INTRAVENOUS | Status: DC | PRN
  Administered 2018-03-19 (×3): 50 mg via INTRAVENOUS
  Administered 2018-03-19: 20 mg via INTRAVENOUS
  Administered 2018-03-19: 30 mg via INTRAVENOUS

## 2018-03-19 MED ORDER — 0.9 % SODIUM CHLORIDE (POUR BTL) OPTIME
TOPICAL | Status: DC | PRN
  Administered 2018-03-19: 5000 mL

## 2018-03-19 MED ORDER — ALBUMIN HUMAN 5 % IV SOLN
INTRAVENOUS | Status: DC | PRN
  Administered 2018-03-19 (×2): via INTRAVENOUS

## 2018-03-19 MED ORDER — PROTAMINE SULFATE 10 MG/ML IV SOLN
INTRAVENOUS | Status: AC
  Filled 2018-03-19: qty 5

## 2018-03-19 MED ORDER — FAMOTIDINE IN NACL 20-0.9 MG/50ML-% IV SOLN
20.0000 mg | Freq: Two times a day (BID) | INTRAVENOUS | Status: AC
  Administered 2018-03-19 (×2): 20 mg via INTRAVENOUS
  Filled 2018-03-19: qty 50

## 2018-03-19 MED ORDER — ROCURONIUM BROMIDE 50 MG/5ML IV SOSY
PREFILLED_SYRINGE | INTRAVENOUS | Status: AC
  Filled 2018-03-19: qty 10

## 2018-03-19 MED ORDER — CHLORHEXIDINE GLUCONATE 0.12 % MT SOLN
15.0000 mL | OROMUCOSAL | Status: AC
  Administered 2018-03-19: 15 mL via OROMUCOSAL

## 2018-03-19 MED ORDER — ALBUMIN HUMAN 5 % IV SOLN: 250.0000 mL | INTRAVENOUS | Status: DC | PRN

## 2018-03-19 MED ORDER — INSULIN REGULAR BOLUS VIA INFUSION
0.0000 [IU] | Freq: Three times a day (TID) | INTRAVENOUS | Status: DC
  Filled 2018-03-19: qty 10

## 2018-03-19 MED ORDER — DOCUSATE SODIUM 100 MG PO CAPS
200.0000 mg | ORAL_CAPSULE | Freq: Every day | ORAL | Status: DC
  Administered 2018-03-20 – 2018-03-21 (×2): 200 mg via ORAL
  Filled 2018-03-19 (×2): qty 2

## 2018-03-19 MED ORDER — METOPROLOL TARTRATE 25 MG/10 ML ORAL SUSPENSION: 12.5000 mg | Freq: Two times a day (BID) | ORAL | Status: DC

## 2018-03-19 MED ORDER — PROPOFOL 10 MG/ML IV BOLUS
INTRAVENOUS | Status: DC | PRN
  Administered 2018-03-19: 70 mg via INTRAVENOUS

## 2018-03-19 MED ORDER — MIDAZOLAM HCL 5 MG/5ML IJ SOLN
INTRAMUSCULAR | Status: DC | PRN
  Administered 2018-03-19: 2 mg via INTRAVENOUS
  Administered 2018-03-19 (×2): 3 mg via INTRAVENOUS
  Administered 2018-03-19: 2 mg via INTRAVENOUS

## 2018-03-19 MED ORDER — ACETAMINOPHEN 650 MG RE SUPP
650.0000 mg | Freq: Once | RECTAL | Status: AC
  Administered 2018-03-19: 650 mg via RECTAL

## 2018-03-19 MED ORDER — ONDANSETRON HCL 4 MG/2ML IJ SOLN
4.0000 mg | Freq: Four times a day (QID) | INTRAMUSCULAR | Status: DC | PRN
  Administered 2018-03-20 – 2018-03-21 (×2): 4 mg via INTRAVENOUS
  Filled 2018-03-19 (×2): qty 2

## 2018-03-19 MED ORDER — LACTATED RINGERS IV SOLN
INTRAVENOUS | Status: DC | PRN
  Administered 2018-03-19 (×2): via INTRAVENOUS

## 2018-03-19 MED ORDER — PANTOPRAZOLE SODIUM 40 MG PO TBEC
40.0000 mg | DELAYED_RELEASE_TABLET | Freq: Every day | ORAL | Status: DC
  Administered 2018-03-21: 40 mg via ORAL
  Filled 2018-03-19: qty 1

## 2018-03-19 MED ORDER — NITROGLYCERIN IN D5W 200-5 MCG/ML-% IV SOLN: 0.0000 ug/min | INTRAVENOUS | Status: DC

## 2018-03-19 MED ORDER — METOPROLOL TARTRATE 12.5 MG HALF TABLET
12.5000 mg | ORAL_TABLET | Freq: Two times a day (BID) | ORAL | Status: DC
  Administered 2018-03-19 – 2018-03-21 (×3): 12.5 mg via ORAL
  Filled 2018-03-19 (×4): qty 1

## 2018-03-19 MED ORDER — DEXAMETHASONE SODIUM PHOSPHATE 10 MG/ML IJ SOLN
INTRAMUSCULAR | Status: AC
  Filled 2018-03-19: qty 1

## 2018-03-19 MED ORDER — THROMBIN (RECOMBINANT) 20000 UNITS EX SOLR
CUTANEOUS | Status: AC
  Filled 2018-03-19: qty 20000

## 2018-03-19 MED ORDER — LACTATED RINGERS IV SOLN: INTRAVENOUS | Status: DC

## 2018-03-19 MED ORDER — HEPARIN SODIUM (PORCINE) 1000 UNIT/ML IJ SOLN
INTRAMUSCULAR | Status: DC | PRN
  Administered 2018-03-19: 27000 [IU] via INTRAVENOUS

## 2018-03-19 MED ORDER — VANCOMYCIN HCL IN DEXTROSE 1-5 GM/200ML-% IV SOLN
1000.0000 mg | Freq: Once | INTRAVENOUS | Status: AC
  Administered 2018-03-19: 1000 mg via INTRAVENOUS
  Filled 2018-03-19: qty 200

## 2018-03-19 MED ORDER — SODIUM CHLORIDE 0.9% FLUSH
3.0000 mL | Freq: Two times a day (BID) | INTRAVENOUS | Status: DC
  Administered 2018-03-20 – 2018-03-21 (×3): 3 mL via INTRAVENOUS

## 2018-03-19 MED ORDER — BISACODYL 5 MG PO TBEC
10.0000 mg | DELAYED_RELEASE_TABLET | Freq: Every day | ORAL | Status: DC
  Administered 2018-03-20 – 2018-03-21 (×2): 10 mg via ORAL
  Filled 2018-03-19 (×2): qty 2

## 2018-03-19 MED ORDER — MORPHINE SULFATE (PF) 2 MG/ML IV SOLN
1.0000 mg | INTRAVENOUS | Status: DC | PRN
  Administered 2018-03-19: 2 mg via INTRAVENOUS
  Administered 2018-03-19: 1 mg via INTRAVENOUS
  Administered 2018-03-19 (×2): 2 mg via INTRAVENOUS
  Administered 2018-03-19 – 2018-03-20 (×3): 4 mg via INTRAVENOUS
  Filled 2018-03-19 (×2): qty 1
  Filled 2018-03-19: qty 2
  Filled 2018-03-19 (×2): qty 1
  Filled 2018-03-19 (×2): qty 2

## 2018-03-19 MED ORDER — INSULIN REGULAR(HUMAN) IN NACL 100-0.9 UT/100ML-% IV SOLN: INTRAVENOUS | Status: DC

## 2018-03-19 MED ORDER — PROTAMINE SULFATE 10 MG/ML IV SOLN
INTRAVENOUS | Status: AC
  Filled 2018-03-19: qty 25

## 2018-03-19 MED ORDER — PHENYLEPHRINE 40 MCG/ML (10ML) SYRINGE FOR IV PUSH (FOR BLOOD PRESSURE SUPPORT)
PREFILLED_SYRINGE | INTRAVENOUS | Status: DC | PRN
  Administered 2018-03-19 (×2): 120 ug via INTRAVENOUS

## 2018-03-19 MED ORDER — SODIUM CHLORIDE 0.45 % IV SOLN
INTRAVENOUS | Status: DC | PRN
  Administered 2018-03-19: 14:00:00 via INTRAVENOUS

## 2018-03-19 MED ORDER — SODIUM CHLORIDE 0.9% FLUSH: 3.0000 mL | INTRAVENOUS | Status: DC | PRN

## 2018-03-19 MED ORDER — METOPROLOL TARTRATE 5 MG/5ML IV SOLN: 2.5000 mg | INTRAVENOUS | Status: DC | PRN

## 2018-03-19 MED ORDER — SODIUM CHLORIDE 0.9 % IV SOLN
INTRAVENOUS | Status: DC | PRN
  Administered 2018-03-19: 750 mg via INTRAVENOUS

## 2018-03-19 MED ORDER — MIDAZOLAM HCL (PF) 10 MG/2ML IJ SOLN
INTRAMUSCULAR | Status: AC
  Filled 2018-03-19: qty 2

## 2018-03-19 MED ORDER — LACTATED RINGERS IV SOLN
INTRAVENOUS | Status: DC | PRN
  Administered 2018-03-19: 07:00:00 via INTRAVENOUS

## 2018-03-19 MED ORDER — FENTANYL CITRATE (PF) 250 MCG/5ML IJ SOLN
INTRAMUSCULAR | Status: DC | PRN
  Administered 2018-03-19: 100 ug via INTRAVENOUS
  Administered 2018-03-19: 150 ug via INTRAVENOUS
  Administered 2018-03-19: 200 ug via INTRAVENOUS
  Administered 2018-03-19: 50 ug via INTRAVENOUS
  Administered 2018-03-19 (×2): 100 ug via INTRAVENOUS
  Administered 2018-03-19 (×2): 150 ug via INTRAVENOUS

## 2018-03-19 MED ORDER — HEPARIN SODIUM (PORCINE) 1000 UNIT/ML IJ SOLN
INTRAMUSCULAR | Status: AC
  Filled 2018-03-19: qty 1

## 2018-03-19 MED ORDER — DEXMEDETOMIDINE HCL IN NACL 200 MCG/50ML IV SOLN: 0.0000 ug/kg/h | INTRAVENOUS | Status: DC

## 2018-03-19 MED ORDER — LACTATED RINGERS IV SOLN: 500.0000 mL | Freq: Once | INTRAVENOUS | Status: DC | PRN

## 2018-03-19 MED ORDER — ASPIRIN EC 325 MG PO TBEC
325.0000 mg | DELAYED_RELEASE_TABLET | Freq: Every day | ORAL | Status: DC
  Administered 2018-03-20 – 2018-03-21 (×2): 325 mg via ORAL
  Filled 2018-03-19 (×2): qty 1

## 2018-03-19 MED ORDER — POTASSIUM CHLORIDE 10 MEQ/50ML IV SOLN: 10.0000 meq | INTRAVENOUS | Status: AC

## 2018-03-19 MED ORDER — SODIUM CHLORIDE 0.9 % IV SOLN
1.5000 g | Freq: Two times a day (BID) | INTRAVENOUS | Status: AC
  Administered 2018-03-19 – 2018-03-21 (×4): 1.5 g via INTRAVENOUS
  Filled 2018-03-19 (×4): qty 1.5

## 2018-03-19 MED ORDER — MAGNESIUM SULFATE 4 GM/100ML IV SOLN
4.0000 g | Freq: Once | INTRAVENOUS | Status: AC
  Administered 2018-03-19: 4 g via INTRAVENOUS
  Filled 2018-03-19: qty 100

## 2018-03-19 MED ORDER — FENTANYL CITRATE (PF) 250 MCG/5ML IJ SOLN
INTRAMUSCULAR | Status: AC
  Filled 2018-03-19: qty 25

## 2018-03-19 MED ORDER — OXYCODONE HCL 5 MG PO TABS: 5.0000 mg | ORAL_TABLET | ORAL | Status: DC | PRN

## 2018-03-19 MED ORDER — ACETAMINOPHEN 500 MG PO TABS
1000.0000 mg | ORAL_TABLET | Freq: Four times a day (QID) | ORAL | Status: DC
  Administered 2018-03-19 – 2018-03-21 (×8): 1000 mg via ORAL
  Filled 2018-03-19 (×8): qty 2

## 2018-03-19 MED ORDER — DEXAMETHASONE SODIUM PHOSPHATE 10 MG/ML IJ SOLN
INTRAMUSCULAR | Status: DC | PRN
  Administered 2018-03-19: 10 mg via INTRAVENOUS

## 2018-03-19 MED ORDER — SODIUM CHLORIDE 0.9 % IV SOLN: 250.0000 mL | INTRAVENOUS | Status: DC

## 2018-03-19 MED ORDER — PHENYLEPHRINE HCL-NACL 20-0.9 MG/250ML-% IV SOLN: 0.0000 ug/min | INTRAVENOUS | Status: DC

## 2018-03-19 MED ORDER — PHENYLEPHRINE 40 MCG/ML (10ML) SYRINGE FOR IV PUSH (FOR BLOOD PRESSURE SUPPORT)
PREFILLED_SYRINGE | INTRAVENOUS | Status: AC
  Filled 2018-03-19: qty 10

## 2018-03-19 MED ORDER — LACTATED RINGERS IV SOLN
INTRAVENOUS | Status: DC
  Administered 2018-03-20: 16:00:00 via INTRAVENOUS

## 2018-03-19 MED FILL — Heparin Sodium (Porcine) Inj 1000 Unit/ML: INTRAMUSCULAR | Qty: 30 | Status: AC

## 2018-03-19 MED FILL — Magnesium Sulfate Inj 50%: INTRAMUSCULAR | Qty: 10 | Status: AC

## 2018-03-19 MED FILL — Potassium Chloride Inj 2 mEq/ML: INTRAVENOUS | Qty: 40 | Status: AC

## 2018-03-19 SURGICAL SUPPLY — 107 items
BAG DECANTER FOR FLEXI CONT (MISCELLANEOUS) ×4 IMPLANT
BANDAGE ACE 4X5 VEL STRL LF (GAUZE/BANDAGES/DRESSINGS) ×4 IMPLANT
BANDAGE ACE 6X5 VEL STRL LF (GAUZE/BANDAGES/DRESSINGS) ×4 IMPLANT
BANDAGE ELASTIC 4 VELCRO ST LF (GAUZE/BANDAGES/DRESSINGS) ×4 IMPLANT
BANDAGE ELASTIC 6 VELCRO ST LF (GAUZE/BANDAGES/DRESSINGS) ×4 IMPLANT
BASKET HEART  (ORDER IN 25'S) (MISCELLANEOUS) ×1
BASKET HEART (ORDER IN 25'S) (MISCELLANEOUS) ×1
BASKET HEART (ORDER IN 25S) (MISCELLANEOUS) ×2 IMPLANT
BLADE STERNUM SYSTEM 6 (BLADE) ×4 IMPLANT
BNDG GAUZE ELAST 4 BULKY (GAUZE/BANDAGES/DRESSINGS) ×4 IMPLANT
CANISTER SUCT 3000ML PPV (MISCELLANEOUS) ×4 IMPLANT
CATH ROBINSON RED A/P 18FR (CATHETERS) ×8 IMPLANT
CATH THORACIC 28FR (CATHETERS) ×8 IMPLANT
CATH THORACIC 36FR (CATHETERS) ×4 IMPLANT
CATH THORACIC 36FR RT ANG (CATHETERS) ×4 IMPLANT
CLIP VESOCCLUDE MED 24/CT (CLIP) IMPLANT
CLIP VESOCCLUDE SM WIDE 24/CT (CLIP) ×12 IMPLANT
COVER WAND RF STERILE (DRAPES) ×4 IMPLANT
CRADLE DONUT ADULT HEAD (MISCELLANEOUS) ×4 IMPLANT
DERMABOND ADVANCED (GAUZE/BANDAGES/DRESSINGS) ×2
DERMABOND ADVANCED .7 DNX12 (GAUZE/BANDAGES/DRESSINGS) ×2 IMPLANT
DRAPE CARDIOVASCULAR INCISE (DRAPES) ×2
DRAPE SLUSH/WARMER DISC (DRAPES) ×4 IMPLANT
DRAPE SRG 135X102X78XABS (DRAPES) ×2 IMPLANT
DRSG COVADERM 4X14 (GAUZE/BANDAGES/DRESSINGS) ×4 IMPLANT
ELECT CAUTERY BLADE 6.4 (BLADE) ×4 IMPLANT
ELECT REM PT RETURN 9FT ADLT (ELECTROSURGICAL) ×8
ELECTRODE REM PT RTRN 9FT ADLT (ELECTROSURGICAL) ×4 IMPLANT
FELT TEFLON 1X6 (MISCELLANEOUS) ×8 IMPLANT
GAUZE SPONGE 4X4 12PLY STRL (GAUZE/BANDAGES/DRESSINGS) ×8 IMPLANT
GAUZE SPONGE 4X4 12PLY STRL LF (GAUZE/BANDAGES/DRESSINGS) ×8 IMPLANT
GLOVE BIO SURGEON STRL SZ 6 (GLOVE) IMPLANT
GLOVE BIO SURGEON STRL SZ 6.5 (GLOVE) IMPLANT
GLOVE BIO SURGEON STRL SZ7 (GLOVE) IMPLANT
GLOVE BIO SURGEON STRL SZ7.5 (GLOVE) IMPLANT
GLOVE BIO SURGEONS STRL SZ 6.5 (GLOVE)
GLOVE BIOGEL PI IND STRL 6 (GLOVE) ×6 IMPLANT
GLOVE BIOGEL PI IND STRL 6.5 (GLOVE) IMPLANT
GLOVE BIOGEL PI IND STRL 7.0 (GLOVE) IMPLANT
GLOVE BIOGEL PI INDICATOR 6 (GLOVE) ×6
GLOVE BIOGEL PI INDICATOR 6.5 (GLOVE)
GLOVE BIOGEL PI INDICATOR 7.0 (GLOVE)
GLOVE EUDERMIC 7 POWDERFREE (GLOVE) ×8 IMPLANT
GLOVE ORTHO TXT STRL SZ7.5 (GLOVE) IMPLANT
GOWN STRL REUS W/ TWL LRG LVL3 (GOWN DISPOSABLE) ×8 IMPLANT
GOWN STRL REUS W/ TWL XL LVL3 (GOWN DISPOSABLE) ×2 IMPLANT
GOWN STRL REUS W/TWL LRG LVL3 (GOWN DISPOSABLE) ×8
GOWN STRL REUS W/TWL XL LVL3 (GOWN DISPOSABLE) ×2
HEMOSTAT POWDER SURGIFOAM 1G (HEMOSTASIS) ×12 IMPLANT
HEMOSTAT SURGICEL 2X14 (HEMOSTASIS) ×8 IMPLANT
INSERT FOGARTY 61MM (MISCELLANEOUS) IMPLANT
INSERT FOGARTY XLG (MISCELLANEOUS) IMPLANT
KIT BASIN OR (CUSTOM PROCEDURE TRAY) ×4 IMPLANT
KIT CATH CPB BARTLE (MISCELLANEOUS) ×4 IMPLANT
KIT SUCTION CATH 14FR (SUCTIONS) ×4 IMPLANT
KIT TURNOVER KIT B (KITS) ×4 IMPLANT
KIT VASOVIEW HEMOPRO 2 VH 4000 (KITS) ×4 IMPLANT
NS IRRIG 1000ML POUR BTL (IV SOLUTION) ×20 IMPLANT
PACK E OPEN HEART (SUTURE) ×4 IMPLANT
PACK OPEN HEART (CUSTOM PROCEDURE TRAY) ×4 IMPLANT
PAD ARMBOARD 7.5X6 YLW CONV (MISCELLANEOUS) ×8 IMPLANT
PAD ELECT DEFIB RADIOL ZOLL (MISCELLANEOUS) ×4 IMPLANT
PENCIL BUTTON HOLSTER BLD 10FT (ELECTRODE) ×8 IMPLANT
PUNCH AORTIC ROTATE  4.5MM 8IN (MISCELLANEOUS) ×4 IMPLANT
PUNCH AORTIC ROTATE 4.0MM (MISCELLANEOUS) IMPLANT
PUNCH AORTIC ROTATE 4.5MM 8IN (MISCELLANEOUS) ×4 IMPLANT
PUNCH AORTIC ROTATE 5MM 8IN (MISCELLANEOUS) IMPLANT
SET CARDIOPLEGIA MPS 5001102 (MISCELLANEOUS) ×4 IMPLANT
SPONGE INTESTINAL PEANUT (DISPOSABLE) IMPLANT
SPONGE LAP 18X18 X RAY DECT (DISPOSABLE) IMPLANT
SPONGE LAP 4X18 RFD (DISPOSABLE) ×4 IMPLANT
SUT BONE WAX W31G (SUTURE) ×4 IMPLANT
SUT MNCRL AB 4-0 PS2 18 (SUTURE) ×4 IMPLANT
SUT PROLENE 3 0 SH DA (SUTURE) IMPLANT
SUT PROLENE 3 0 SH1 36 (SUTURE) ×4 IMPLANT
SUT PROLENE 4 0 RB 1 (SUTURE)
SUT PROLENE 4 0 SH DA (SUTURE) IMPLANT
SUT PROLENE 4-0 RB1 .5 CRCL 36 (SUTURE) IMPLANT
SUT PROLENE 5 0 C 1 36 (SUTURE) IMPLANT
SUT PROLENE 6 0 C 1 30 (SUTURE) ×4 IMPLANT
SUT PROLENE 7 0 BV 1 (SUTURE) IMPLANT
SUT PROLENE 7 0 BV1 MDA (SUTURE) ×8 IMPLANT
SUT PROLENE 8 0 BV175 6 (SUTURE) ×4 IMPLANT
SUT SILK  1 MH (SUTURE)
SUT SILK 1 MH (SUTURE) IMPLANT
SUT STEEL 6MS V (SUTURE) ×8 IMPLANT
SUT STEEL STERNAL CCS#1 18IN (SUTURE) IMPLANT
SUT STEEL SZ 6 DBL 3X14 BALL (SUTURE) IMPLANT
SUT VIC AB 1 CTX 36 (SUTURE) ×4
SUT VIC AB 1 CTX36XBRD ANBCTR (SUTURE) ×4 IMPLANT
SUT VIC AB 2-0 CT1 27 (SUTURE) ×2
SUT VIC AB 2-0 CT1 TAPERPNT 27 (SUTURE) ×2 IMPLANT
SUT VIC AB 2-0 CTX 27 (SUTURE) IMPLANT
SUT VIC AB 3-0 SH 27 (SUTURE)
SUT VIC AB 3-0 SH 27X BRD (SUTURE) IMPLANT
SUT VIC AB 3-0 X1 27 (SUTURE) IMPLANT
SUT VICRYL 4-0 PS2 18IN ABS (SUTURE) IMPLANT
SYSTEM SAHARA CHEST DRAIN ATS (WOUND CARE) ×8 IMPLANT
TAPE CLOTH SOFT 2X10 (GAUZE/BANDAGES/DRESSINGS) ×4 IMPLANT
TAPE CLOTH SURG 4X10 WHT LF (GAUZE/BANDAGES/DRESSINGS) ×4 IMPLANT
TOWEL GREEN STERILE (TOWEL DISPOSABLE) ×4 IMPLANT
TOWEL GREEN STERILE FF (TOWEL DISPOSABLE) ×4 IMPLANT
TRAY FOLEY SLVR 16FR TEMP STAT (SET/KITS/TRAYS/PACK) ×4 IMPLANT
TUBE SUCT INTRACARD DLP 20F (MISCELLANEOUS) ×4 IMPLANT
TUBING INSUFFLATION (TUBING) ×4 IMPLANT
UNDERPAD 30X30 (UNDERPADS AND DIAPERS) ×4 IMPLANT
WATER STERILE IRR 1000ML POUR (IV SOLUTION) ×8 IMPLANT

## 2018-03-19 NOTE — Brief Op Note (Signed)
03/16/2018 - 03/19/2018  12:25 PM  PATIENT:  Tim Strickland  53 y.o. male  PRE-OPERATIVE DIAGNOSIS:  CAD  POST-OPERATIVE DIAGNOSIS:  CAD  PROCEDURE:  Procedure(s): CORONARY ARTERY BYPASS GRAFTING (CABG), ON PUMP, TIMES FIVE, USING BILATERAL INTERNAL MAMMARY ARTERIES AND ENDOSCOPICALLY HARVESTED RIGHT GREATER SAPHENOUS VEIN (N/A) TRANSESOPHAGEAL ECHOCARDIOGRAM (TEE) (N/A)  SURGEON:  Surgeon(s) and Role:    * Bartle, Fernande Boyden, MD - Primary  PHYSICIAN ASSISTANT: Macarthur Critchley, PA-C   ANESTHESIA:   general  EBL:  Per anesthesia and perfusion records  BLOOD ADMINISTERED:none  DRAINS: Right and left pleural tubes, mediastinal drain   LOCAL MEDICATIONS USED:  NONE  SPECIMEN:  No Specimen  DISPOSITION OF SPECIMEN:  N/A  COUNTS:  YES  DICTATION: .Dragon Dictation  PLAN OF CARE: Admit to inpatient   PATIENT DISPOSITION:  ICU - extubated and stable.   Delay start of Pharmacological VTE agent (>24hrs) due to surgical blood loss or risk of bleeding: yes

## 2018-03-19 NOTE — Procedures (Signed)
Extubation Procedure Note  Patient Details:   Name: Tim Strickland DOB: Mar 18, 1965 MRN: [id number]   Airway Documentation:    Vent end date: 03/19/18 Vent end time: 1807   Evaluation  O2 sats: stable throughout Complications: No apparent complications Patient did tolerate procedure well. Bilateral Breath Sounds: Clear, Diminished   Yes   PT was extubated to a 3L [place]  PT was able to speak and has a strong cough    Deven Furia, Leonie Douglas 03/19/2018, 6:09 PM

## 2018-03-19 NOTE — Anesthesia Procedure Notes (Signed)
Central Venous Catheter Insertion Performed by: Roberts Gaudy, MD, anesthesiologist Start/End1/13/2020 6:40 AM, 03/19/2018 6:50 AM Patient location: Pre-op. Preanesthetic checklist: patient identified, IV checked, site marked, risks and benefits discussed, surgical consent, monitors and equipment checked, pre-op evaluation, timeout performed and anesthesia consent Hand hygiene performed  and maximum sterile barriers used  PA cath was placed.Swan type:thermodilution Procedure performed without using ultrasound guided technique. Attempts: 1 Following insertion, line sutured, dressing applied and Biopatch. Post procedure assessment: blood return through all ports, free fluid flow and no air  Patient tolerated the procedure well with no immediate complications.

## 2018-03-19 NOTE — Anesthesia Postprocedure Evaluation (Signed)
Anesthesia Post Note  Patient: Tim Strickland  Procedure(s) Performed: CORONARY ARTERY BYPASS GRAFTING (CABG), ON PUMP, TIMES FIVE, USING BILATERAL INTERNAL MAMMARY ARTERIES AND ENDOSCOPICALLY HARVESTED RIGHT GREATER SAPHENOUS VEIN (N/A Chest) TRANSESOPHAGEAL ECHOCARDIOGRAM (TEE) (N/A )     Patient location during evaluation: SICU Anesthesia Type: General Level of consciousness: sedated and patient remains intubated per anesthesia plan Pain management: pain level controlled Vital Signs Assessment: post-procedure vital signs reviewed and stable Respiratory status: patient remains intubated per anesthesia plan and patient on ventilator - see flowsheet for VS Cardiovascular status: stable Postop Assessment: no apparent nausea or vomiting Anesthetic complications: no    Last Vitals:  Vitals:   03/19/18 1445 03/19/18 1500  BP:    Pulse: 83 83  Resp: 12 12  Temp: (!) 36.3 C (!) 36.4 C  SpO2: 100% 100%    Last Pain:  Vitals:   03/19/18 0503  TempSrc: Oral  PainSc:                  Emonee Winkowski COKER

## 2018-03-19 NOTE — Anesthesia Preprocedure Evaluation (Signed)
Anesthesia Evaluation  Patient identified by MRN, date of birth, ID band Patient awake    Reviewed: Allergy & Precautions, NPO status , Patient's Chart, lab work & pertinent test results  Airway Mallampati: II  TM Distance: >3 FB Neck ROM: Full    Dental  (+) Teeth Intact, Dental Advisory Given   Pulmonary    breath sounds clear to auscultation       Cardiovascular hypertension,  Rhythm:Regular Rate:Normal     Neuro/Psych    GI/Hepatic   Endo/Other    Renal/GU      Musculoskeletal   Abdominal   Peds  Hematology   Anesthesia Other Findings   Reproductive/Obstetrics                             Anesthesia Physical Anesthesia Plan  ASA: III  Anesthesia Plan: General   Post-op Pain Management:    Induction: Intravenous  PONV Risk Score and Plan: Ondansetron and Dexamethasone  Airway Management Planned: Oral ETT  Additional Equipment: Arterial line, CVP, 3D TEE and Ultrasound Guidance Line Placement  Intra-op Plan:   Post-operative Plan: Post-operative intubation/ventilation  Informed Consent: I have reviewed the patients History and Physical, chart, labs and discussed the procedure including the risks, benefits and alternatives for the proposed anesthesia with the patient or authorized representative who has indicated his/her understanding and acceptance.   Dental advisory given  Plan Discussed with: CRNA and Anesthesiologist  Anesthesia Plan Comments:         Anesthesia Quick Evaluation

## 2018-03-19 NOTE — Op Note (Signed)
CARDIOVASCULAR SURGERY OPERATIVE NOTE  03/19/2018  Surgeon:  Gaye Pollack, MD  First Assistant: Enid Cutter,  PA-C   Preoperative Diagnosis:  Severe multi-vessel coronary artery disease   Postoperative Diagnosis:  Same   Procedure:  1. Median Sternotomy 2. Extracorporeal circulation 3.   Coronary artery bypass grafting x 5   Right internal mammary artery graft to the LAD  Left internal mammary artery graft to the second diagonal  SVG to first diagonal  Sequential SVG to PDA and PL   4.   Endoscopic vein harvest from the right leg   Anesthesia:  General Endotracheal   Clinical History/Surgical Indication:  53 year old with at least a six month history of chest tightness with radiation down his left arm to his wrist. This was initially occurring with exertion and now occurring at rest. 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 FFR in the distal RCA was 0.76, < 0.5 in the diagonal and mid LAD. Cath confirms a 90% ostial and 99% proximal LAD stenosis. The large diagonal has 50% proximal stenosis. The RCA has 65% distal stenosis. Echo showed an EF of 50-55% with hypokinesis of the apical-lateral and apical myocardium. I agree that CABG is indicated in this patient. I discussed the operative procedure with the patient  including alternatives, benefits and risks; including but not limited to bleeding, blood transfusion, infection, stroke, myocardial infarction, graft failure, heart block requiring a permanent pacemaker, organ dysfunction, and death.  [name] understands and agrees to proceed.   Preparation:  The patient was seen in the preoperative holding area and the correct patient, correct operation were confirmed with the patient  after reviewing the medical record and catheterization. The consent was signed by me. Preoperative antibiotics were given. A pulmonary arterial line and radial arterial line were placed by the anesthesia team. The patient was taken back to the operating room and positioned supine on the operating room table. After being placed under general endotracheal anesthesia by the anesthesia team a foley catheter was placed. The neck, chest, abdomen, and both legs were prepped with betadine soap and solution and draped in the usual sterile manner. A surgical time-out was taken and the correct patient and operative procedure were confirmed with the nursing and anesthesia staff.   Cardiopulmonary Bypass:  A median sternotomy was performed. The pericardium was opened in the midline. Right ventricular function appeared normal. The ascending aorta was of normal size and had no palpable plaque. There were no contraindications to aortic cannulation or cross-clamping. The patient was fully systemically heparinized and the ACT was maintained > 400 sec. The proximal aortic arch was cannulated with a 20 F aortic cannula for arterial inflow. Venous cannulation was performed via the right atrial appendage using a two-staged venous cannula. An antegrade cardioplegia/vent cannula was inserted into the mid-ascending aorta. Aortic occlusion was performed with a single cross-clamp. Systemic cooling to 32 degrees Centigrade and topical cooling of the heart with iced saline were used. Hyperkalemic antegrade cold blood cardioplegia was used to induce diastolic arrest and was then given at about 20 minute intervals throughout the period of arrest to maintain myocardial temperature at or below 10 degrees centigrade. A temperature probe was inserted into the interventricular septum and an insulating pad was placed in the pericardium.   Left internal mammary artery harvest:  The left side of  the sternum was retracted using the Rultract  retractor. The left internal mammary artery was harvested as a pedicle graft. All side branches were clipped. It was a medium-sized vessel of good quality with excellent blood flow. It was ligated distally and divided. It was sprayed with topical papaverine solution to prevent vasospasm.   Right internal mammary artery harvest:  The rightside of the sternum was retracted using the Rultract retractor. The right internal mammary artery was harvested as a pedicle graft. All side branches were clipped. It was a medium-sized, long vessel of good quality with excellent blood flow. It was ligated distally and divided. It was sprayed with topical papaverine solution to prevent vasospasm.  Endoscopic vein harvest:  The right greater saphenous vein was harvested endoscopically through a 2 cm incision medial to the right knee. It was harvested from the thigh. It was a medium-sized vein of good quality. The side branches were all ligated with 4-0 silk ties.    Coronary arteries:  The coronary arteries were examined.   LAD:  Large vessel with no distal disease. D1 was a moderate sized vessel with no distal disease. The D2 branch was large with proximal disease but no distal disease.  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 The distal branches were tiny and not graftable.  RCA:  The main RCA was diffusely diseased. The PDA was a moderate sized vessel with no distal disease. The largest PL was a large vessel with no distal disease. There were a couple other small PL branches.   Grafts:  1. RIMA to the LAD: 2.5 mm. It was sewn end to side using 8-0 prolene continuous suture. 2. LIMA to D2:  2.0 mm. It was sewn end to side using 8-0 prolene continuous suture. 3. SVG to D1:  1.6 mm. It was sewn end to side using 7-0 prolene continuous suture. 4. Sequential SVG to PDA:  1.6 mm. It was sewn sequential side to side using 7-0 prolene continuous suture. 5. Sequential SVG to PL:   2.0 mm. It was sewn sequential end to side using 7-0 prolene continuous suture.   The proximal vein graft anastomoses were performed to the mid-ascending aorta using continuous 6-0 prolene suture. Graft markers were placed around the proximal anastomoses.   Completion:  The patient was rewarmed to 37 degrees Centigrade. The clamp was removed from the LIMA pedicle and there was rapid warming of the septum and return of ventricular fibrillation. The crossclamp was removed with a time of 89 minutes. There was spontaneous return of sinus rhythm. The distal and proximal anastomoses were checked for hemostasis. The position of the grafts was satisfactory. Two temporary epicardial pacing wires were placed on the right atrium and two on the right ventricle. The patient was weaned from CPB without difficulty on no inotropes. CPB time was 108 minutes. Cardiac output was 6 LPM. TEE showed normal LV systolic function. Heparin was fully reversed with protamine and the aortic and venous cannulas removed. Hemostasis was achieved. Mediastinal and left pleural drainage tubes were placed. The sternum was closed with  #6 stainless steel wires. The fascia was closed with continuous # 1 vicryl suture. The subcutaneous tissue was closed with 2-0 vicryl continuous suture. The skin was closed with 3-0 vicryl subcuticular suture. All sponge, needle, and instrument counts were reported correct at the end of the case. Dry sterile dressings were placed over the incisions and around the chest tubes which were connected to pleurevac suction. The patient  was then transported to the surgical intensive care unit in stable condition.

## 2018-03-19 NOTE — Anesthesia Procedure Notes (Signed)
Central Venous Catheter Insertion Performed by: Roberts Gaudy, MD, anesthesiologist Start/End1/13/2020 6:40 AM, 03/19/2018 6:50 AM Patient location: Pre-op. Preanesthetic checklist: patient identified, IV checked, site marked, risks and benefits discussed, surgical consent, monitors and equipment checked, pre-op evaluation, timeout performed and anesthesia consent Lidocaine 1% used for infiltration and patient sedated Hand hygiene performed  and maximum sterile barriers used  Catheter size: 9 Fr Sheath introducer Procedure performed using ultrasound guided technique. Ultrasound Notes:anatomy identified, needle tip was noted to be adjacent to the nerve/plexus identified, no ultrasound evidence of intravascular and/or intraneural injection and image(s) printed for medical record Attempts: 1 Following insertion, line sutured and dressing applied. Post procedure assessment: blood return through all ports, free fluid flow and no air  Patient tolerated the procedure well with no immediate complications.

## 2018-03-19 NOTE — Progress Notes (Signed)
Patient ID: Tim Strickland, male   DOB: 1965/09/09, 53 y.o.   MRN: [id number]  TCTS Evening Rounds:   Hemodynamically stable  CI = 2.7  Extubated, alert, neuro intact.  Urine output good  CT output low  CBC    Component Value Date/Time   WBC 16.6 (H) 03/19/2018 1353   RBC 3.77 (L) 03/19/2018 1353   HGB 11.7 (L) 03/19/2018 1353   HGB 11.2 (L) 03/19/2018 1353   HGB 15.9 03/15/2018 0945   HCT 34.3 (L) 03/19/2018 1353   HCT 33.0 (L) 03/19/2018 1353   HCT 44.6 03/15/2018 0945   PLT 188 03/19/2018 1353   PLT 284 03/15/2018 0945   MCV 91.0 03/19/2018 1353   MCV 91 03/15/2018 0945   MCH 31.0 03/19/2018 1353   MCHC 34.1 03/19/2018 1353   RDW 12.3 03/19/2018 1353   RDW 13.6 03/15/2018 0945   LYMPHSABS 2.9 01/27/2014 1643   MONOABS 0.8 01/27/2014 1643   EOSABS 1.3 (H) 01/27/2014 1643   BASOSABS 0.2 (H) 01/27/2014 1643     BMET    Component Value Date/Time   NA 139 03/19/2018 1353   NA 138 03/15/2018 0945   K 4.2 03/19/2018 1353   CL 102 03/19/2018 1232   CO2 25 03/19/2018 0355   GLUCOSE 91 03/19/2018 1353   BUN 14 03/19/2018 1232   BUN 13 03/15/2018 0945   CREATININE 0.70 03/19/2018 1232   CALCIUM 9.4 03/19/2018 0355   GFRNONAA >60 03/19/2018 0355   GFRAA >60 03/19/2018 0355     A/P:  Stable postop course. Continue current plans

## 2018-03-19 NOTE — Transfer of Care (Signed)
Immediate Anesthesia Transfer of Care Note  Patient: Tim Strickland  Procedure(s) Performed: CORONARY ARTERY BYPASS GRAFTING (CABG), ON PUMP, TIMES FIVE, USING BILATERAL INTERNAL MAMMARY ARTERIES AND ENDOSCOPICALLY HARVESTED RIGHT GREATER SAPHENOUS VEIN (N/A Chest) TRANSESOPHAGEAL ECHOCARDIOGRAM (TEE) (N/A )  Patient Location: PACU and ICU  Anesthesia Type:General  Level of Consciousness: Patient remains intubated per anesthesia plan  Airway & Oxygen Therapy: Patient remains intubated per anesthesia plan  Post-op Assessment: Report given to RN and Post -op Vital signs reviewed and stable  Post vital signs: Reviewed and stable  Last Vitals:  Vitals Value Taken Time  BP    Temp    Pulse    Resp    SpO2      Last Pain:  Vitals:   03/19/18 0503  TempSrc: Oral  PainSc:       Patients Stated Pain Goal: 0 (29/57/47 3403)  Complications: No apparent anesthesia complications

## 2018-03-19 NOTE — Progress Notes (Signed)
TCTS:   He had a stable weekend with no chest pain or shortness of breath. Ready for CABG today.

## 2018-03-19 NOTE — Progress Notes (Signed)
  Echocardiogram Echocardiogram Transesophageal has been performed.  Tim Strickland 03/19/2018, 8:47 AM

## 2018-03-19 NOTE — Anesthesia Procedure Notes (Signed)
Procedure Name: Intubation Date/Time: 03/19/2018 7:47 AM Performed by: Trinna Post., CRNA Pre-anesthesia Checklist: Patient identified, Emergency Drugs available, Suction available, Patient being monitored and Timeout performed Patient Re-evaluated:Patient Re-evaluated prior to induction Oxygen Delivery Method: Circle system utilized Preoxygenation: Pre-oxygenation with 100% oxygen Induction Type: IV induction Ventilation: Mask ventilation without difficulty Laryngoscope Size: Miller and 2 Grade View: Grade III Tube type: Oral Tube size: 8.0 mm Number of attempts: 2 (MAC 4 by CRNA x1 with no view, Miller 2 by MDA with success) Airway Equipment and Method: Stylet Placement Confirmation: ETT inserted through vocal cords under direct vision,  positive ETCO2 and breath sounds checked- equal and bilateral Secured at: 22 cm Tube secured with: Tape Dental Injury: Teeth and Oropharynx as per pre-operative assessment  Difficulty Due To: Difficulty was unanticipated and Difficult Airway- due to anterior larynx

## 2018-03-19 NOTE — Anesthesia Procedure Notes (Signed)
Arterial Line Insertion Start/End1/13/2020 6:50 AM Performed by: Trinna Post., CRNA, CRNA  Patient location: Pre-op. Preanesthetic checklist: patient identified, IV checked, site marked, risks and benefits discussed, surgical consent, monitors and equipment checked, pre-op evaluation, timeout performed and anesthesia consent Lidocaine 1% used for infiltration and patient sedated Left, radial was placed Catheter size: 20 G Hand hygiene performed  and maximum sterile barriers used   Attempts: 1 Procedure performed without using ultrasound guided technique. Following insertion, dressing applied and Biopatch. Post procedure assessment: normal  Patient tolerated the procedure well with no immediate complications.

## 2018-03-20 ENCOUNTER — Encounter (HOSPITAL_COMMUNITY): Payer: Self-pay

## 2018-03-20 ENCOUNTER — Encounter (HOSPITAL_COMMUNITY): Payer: Self-pay | Admitting: Surgery

## 2018-03-20 ENCOUNTER — Inpatient Hospital Stay (HOSPITAL_COMMUNITY): Payer: 59

## 2018-03-20 ENCOUNTER — Encounter (HOSPITAL_COMMUNITY): Payer: Self-pay | Admitting: *Deleted

## 2018-03-20 DIAGNOSIS — Z951 Presence of aortocoronary bypass graft: Secondary | ICD-10-CM

## 2018-03-20 LAB — POCT I-STAT, CHEM 8
BUN: 13 mg/dL (ref 6–20)
Calcium, Ion: 1.17 mmol/L (ref 1.15–1.40)
Chloride: 101 mmol/L (ref 98–111)
Creatinine, Ser: 0.9 mg/dL (ref 0.61–1.24)
Glucose, Bld: 164 mg/dL — ABNORMAL HIGH (ref 70–99)
HCT: 33 % — ABNORMAL LOW (ref 39.0–52.0)
Hemoglobin: 11.2 g/dL — ABNORMAL LOW (ref 13.0–17.0)
Potassium: 4.2 mmol/L (ref 3.5–5.1)
Sodium: 136 mmol/L (ref 135–145)
TCO2: 24 mmol/L (ref 22–32)

## 2018-03-20 LAB — MAGNESIUM
Magnesium: 2.6 mg/dL — ABNORMAL HIGH (ref 1.7–2.4)
Magnesium: 2.4 mg/dL (ref 1.7–2.4)

## 2018-03-20 LAB — CBC
HCT: 34.2 % — ABNORMAL LOW (ref 39.0–52.0)
HEMATOCRIT: 34.7 % — AB (ref 39.0–52.0)
HEMOGLOBIN: 12.1 g/dL — AB (ref 13.0–17.0)
Hemoglobin: 11.7 g/dL — ABNORMAL LOW (ref 13.0–17.0)
MCH: 32 pg (ref 26.0–34.0)
MCH: 31.5 pg (ref 26.0–34.0)
MCHC: 34.9 g/dL (ref 30.0–36.0)
MCHC: 34.2 g/dL (ref 30.0–36.0)
MCV: 91.8 fL (ref 80.0–100.0)
MCV: 92.2 fL (ref 80.0–100.0)
Platelets: 205 10*3/uL (ref 150–400)
Platelets: 182 10*3/uL (ref 150–400)
RBC: 3.78 MIL/uL — ABNORMAL LOW (ref 4.22–5.81)
RBC: 3.71 MIL/uL — ABNORMAL LOW (ref 4.22–5.81)
RDW: 12.6 % (ref 11.5–15.5)
RDW: 12.6 % (ref 11.5–15.5)
WBC: 15.1 10*3/uL — ABNORMAL HIGH (ref 4.0–10.5)
WBC: 14.2 10*3/uL — ABNORMAL HIGH (ref 4.0–10.5)
nRBC: 0 % (ref 0.0–0.2)
nRBC: 0 % (ref 0.0–0.2)

## 2018-03-20 LAB — BASIC METABOLIC PANEL
Anion gap: 7 (ref 5–15)
BUN: 14 mg/dL (ref 6–20)
CO2: 21 mmol/L — AB (ref 22–32)
CREATININE: 0.99 mg/dL (ref 0.61–1.24)
Calcium: 8 mg/dL — ABNORMAL LOW (ref 8.9–10.3)
Chloride: 107 mmol/L (ref 98–111)
GFR calc non Af Amer: >60 mL/min (ref >60)
Glucose, Bld: 119 mg/dL — ABNORMAL HIGH (ref 70–99)
Potassium: 4.3 mmol/L (ref 3.5–5.1)
Sodium: 135 mmol/L (ref 135–145)

## 2018-03-20 LAB — CREATININE, SERUM
Creatinine, Ser: 1.07 mg/dL (ref 0.61–1.24)
GFR calc Af Amer: >60 mL/min (ref >60)
GFR calc non Af Amer: >60 mL/min (ref >60)

## 2018-03-20 LAB — GLUCOSE, CAPILLARY
Glucose-Capillary: 106 mg/dL — ABNORMAL HIGH (ref 70–99)
Glucose-Capillary: 108 mg/dL — ABNORMAL HIGH (ref 70–99)
Glucose-Capillary: 116 mg/dL — ABNORMAL HIGH (ref 70–99)
Glucose-Capillary: 117 mg/dL — ABNORMAL HIGH (ref 70–99)
Glucose-Capillary: 120 mg/dL — ABNORMAL HIGH (ref 70–99)
Glucose-Capillary: 121 mg/dL — ABNORMAL HIGH (ref 70–99)
Glucose-Capillary: 136 mg/dL — ABNORMAL HIGH (ref 70–99)
Glucose-Capillary: 94 mg/dL (ref 70–99)

## 2018-03-20 MED ORDER — ENOXAPARIN SODIUM 40 MG/0.4ML ~~LOC~~ SOLN
40.0000 mg | Freq: Every day | SUBCUTANEOUS | Status: DC
  Administered 2018-03-20: 40 mg via SUBCUTANEOUS
  Filled 2018-03-20: qty 0.4

## 2018-03-20 MED ORDER — POTASSIUM CHLORIDE CRYS ER 20 MEQ PO TBCR
20.0000 meq | EXTENDED_RELEASE_TABLET | Freq: Once | ORAL | Status: AC
  Administered 2018-03-20: 20 meq via ORAL
  Filled 2018-03-20: qty 1

## 2018-03-20 MED ORDER — FUROSEMIDE 10 MG/ML IJ SOLN
40.0000 mg | Freq: Once | INTRAMUSCULAR | Status: AC
  Administered 2018-03-20: 40 mg via INTRAVENOUS
  Filled 2018-03-20: qty 4

## 2018-03-20 MED ORDER — TRAMADOL HCL 50 MG PO TABS
50.0000 mg | ORAL_TABLET | ORAL | Status: DC | PRN
  Administered 2018-03-21: 50 mg via ORAL
  Filled 2018-03-20: qty 1

## 2018-03-20 MED ORDER — INSULIN ASPART 100 UNIT/ML ~~LOC~~ SOLN: 0.0000 [IU] | SUBCUTANEOUS | Status: DC

## 2018-03-20 MED ORDER — POTASSIUM CHLORIDE CRYS ER 20 MEQ PO TBCR
20.0000 meq | EXTENDED_RELEASE_TABLET | Freq: Two times a day (BID) | ORAL | Status: AC
  Administered 2018-03-20 (×2): 20 meq via ORAL
  Filled 2018-03-20 (×2): qty 1

## 2018-03-20 MED FILL — Thrombin (Recombinant) For Soln 20000 Unit: CUTANEOUS | Qty: 1 | Status: AC

## 2018-03-20 NOTE — Progress Notes (Signed)
1 Day Post-Op Procedure(s) (LRB): CORONARY ARTERY BYPASS GRAFTING (CABG), ON PUMP, TIMES FIVE, USING BILATERAL INTERNAL MAMMARY ARTERIES AND ENDOSCOPICALLY HARVESTED RIGHT GREATER SAPHENOUS VEIN (N/A) TRANSESOPHAGEAL ECHOCARDIOGRAM (TEE) (N/A) Subjective: No complaints.  Objective: Vital signs in last 24 hours: Temp:  [96.6 F (35.9 C)-100 F (37.8 C)] 99.5 F (37.5 C) (01/14 0700) Pulse Rate:  [78-98] 83 (01/14 0700) Cardiac Rhythm: Normal sinus rhythm (01/14 0500) Resp:  [10-22] 15 (01/14 0700) BP: (84-125)/(71-87) 113/80 (01/14 0600) SpO2:  [92 %-100 %] 98 % (01/14 0700) Arterial Line BP: (93-137)/(57-79) 116/64 (01/14 0700) FiO2 (%):  [50 %] 50 % (01/13 1330) Weight:  [82.1 kg] 82.1 kg (01/14 0500)  Hemodynamic parameters for last 24 hours: PAP: (10-25)/(2-13) 17/5 CO:  [3.6 L/min-7.7 L/min] 7.7 L/min CI:  [1.8 L/min/m2-3.8 L/min/m2] 3.8 L/min/m2  Intake/Output from previous day: 01/13 0701 - 01/14 0700 In: 4612.5 [P.O.:510; I.V.:3014.8; IV Piggyback:1087.7] Out: 6294 [Urine:2785; Blood:600; Chest Tube:690] Intake/Output this shift: No intake/output data recorded.  General appearance: alert and cooperative Neurologic: intact Heart: regular rate and rhythm, S1, S2 normal, no murmur, click, rub or gallop Lungs: clear to auscultation bilaterally Extremities: edema mild Wound: dressings dry  Lab Results: Recent Labs    03/19/18 1901 03/19/18 2009 03/20/18 0404  WBC 16.6*  --  15.1*  HGB 12.4* 11.2* 12.1*  HCT 35.6* 33.0* 34.7*  PLT 213  --  205   BMET:  Recent Labs    03/19/18 0355  03/19/18 2009 03/20/18 0404  NA 137   < > 137 135  K 4.4   < > 4.5 4.3  CL 102   < > 104 107  CO2 25  --   --  21*  GLUCOSE 110*   < > 123* 119*  BUN 16   < > 16 14  CREATININE 1.13   < > 0.80 0.99  CALCIUM 9.4  --   --  8.0*   < > = values in this interval not displayed.    PT/INR:  Recent Labs    03/19/18 1353  LABPROT 15.9*  INR 1.28   ABG    Component Value  Date/Time   PHART 7.332 (L) 03/19/2018 1830   HCO3 20.7 03/19/2018 1830   TCO2 23 03/19/2018 2009   ACIDBASEDEF 5.0 (H) 03/19/2018 1830   O2SAT 99.0 03/19/2018 1830   CBG (last 3)  Recent Labs    03/20/18 0103 03/20/18 0203 03/20/18 0414  GLUCAP 120* 117* 116*   CXR: clear  ECG: sinus, non-specific changes  Assessment/Plan: S/P Procedure(s) (LRB): CORONARY ARTERY BYPASS GRAFTING (CABG), ON PUMP, TIMES FIVE, USING BILATERAL INTERNAL MAMMARY ARTERIES AND ENDOSCOPICALLY HARVESTED RIGHT GREATER SAPHENOUS VEIN (N/A) TRANSESOPHAGEAL ECHOCARDIOGRAM (TEE) (N/A)  POD 1 Hemodynamically stable in sinus rhythm. Continue low dose Lopressor.  DC chest tubes, swan, arterial line.  No hx of DM and Hgb A1c 5.5 preop so will DC CBG's  OOB, IS, ambulation.   LOS: 4 days    Gaye Pollack 03/20/2018

## 2018-03-20 NOTE — Progress Notes (Signed)
EVENING ROUNDS NOTE :     [address]       [place] [address]             [phone]                 1 Day Post-Op Procedure(s) (LRB): CORONARY ARTERY BYPASS GRAFTING (CABG), ON PUMP, TIMES FIVE, USING BILATERAL INTERNAL MAMMARY ARTERIES AND ENDOSCOPICALLY HARVESTED RIGHT GREATER SAPHENOUS VEIN (N/A) TRANSESOPHAGEAL ECHOCARDIOGRAM (TEE) (N/A)  Total Length of Stay:  LOS: 4 days  BP 107/72   Pulse 91   Temp 98.4 F (36.9 C) (Oral)   Resp (!) 24   Ht 6\' 1"  (1.854 m)   Wt 82.1 kg   SpO2 96%   BMI 23.88 kg/m   .Intake/Output      01/13 0701 - 01/14 0700 01/14 0701 - 01/15 0700   P.O. 510    I.V. (mL/kg) 3014.8 (36.7) 196.9 (2.4)   IV Piggyback 1087.7 100   Total Intake(mL/kg) 4612.5 (56.2) 296.9 (3.6)   Urine (mL/kg/hr) 2785 (1.4) 2385 (2.5)   Blood 600    Chest Tube 690 80   Total Output 4075 2465   Net +537.5 -2168.1          . sodium chloride Stopped (03/20/18 0842)  . sodium chloride    . sodium chloride 10 mL/hr at 03/19/18 1330  . cefUROXime (ZINACEF)  IV Stopped (03/20/18 1730)  . lactated ringers 20 mL/hr at 03/20/18 1800  . lactated ringers 20 mL/hr at 03/20/18 1626  . nitroGLYCERIN    . phenylephrine (NEO-SYNEPHRINE) Adult infusion Stopped (03/19/18 1732)     Lab Results  Component Value Date   WBC 14.2 (H) 03/20/2018   HGB 11.7 (L) 03/20/2018   HCT 34.2 (L) 03/20/2018   PLT 182 03/20/2018   GLUCOSE 119 (H) 03/20/2018   CHOL 239 (H) 01/25/2018   TRIG 87 01/25/2018   HDL 68 01/25/2018   LDLDIRECT 205.1 12/26/2012   LDLCALC 154 (H) 01/25/2018   ALT 50 (H) 03/18/2018   AST 32 03/18/2018   NA 135 03/20/2018   K 4.3 03/20/2018   CL 107 03/20/2018   CREATININE 1.07 03/20/2018   BUN 14 03/20/2018   CO2 21 (L) 03/20/2018   TSH 3.69 01/15/2014   PSA 0.94 11/02/2016   INR 1.28 03/19/2018   HGBA1C 5.5 03/18/2018   Stable day  up to chair this evening    Grace Isaac MD  Beeper [phone] Office [phone] 03/20/2018  6:39 PM

## 2018-03-20 NOTE — Progress Notes (Signed)
Notified Bartle, MD of 9 beat run VTach. No new orders at this time.

## 2018-03-21 ENCOUNTER — Inpatient Hospital Stay (HOSPITAL_COMMUNITY): Payer: 59

## 2018-03-21 LAB — CBC
HEMATOCRIT: 31 % — AB (ref 39.0–52.0)
Hemoglobin: 10.8 g/dL — ABNORMAL LOW (ref 13.0–17.0)
MCH: 32.4 pg (ref 26.0–34.0)
MCHC: 34.8 g/dL (ref 30.0–36.0)
MCV: 93.1 fL (ref 80.0–100.0)
Platelets: 167 10*3/uL (ref 150–400)
RBC: 3.33 MIL/uL — ABNORMAL LOW (ref 4.22–5.81)
RDW: 12.5 % (ref 11.5–15.5)
WBC: 13.2 10*3/uL — ABNORMAL HIGH (ref 4.0–10.5)
nRBC: 0 % (ref 0.0–0.2)

## 2018-03-21 LAB — GLUCOSE, CAPILLARY
Glucose-Capillary: 108 mg/dL — ABNORMAL HIGH (ref 70–99)
Glucose-Capillary: 126 mg/dL — ABNORMAL HIGH (ref 70–99)

## 2018-03-21 LAB — BASIC METABOLIC PANEL
Anion gap: 6 (ref 5–15)
BUN: 12 mg/dL (ref 6–20)
CO2: 27 mmol/L (ref 22–32)
Calcium: 8.2 mg/dL — ABNORMAL LOW (ref 8.9–10.3)
Chloride: 103 mmol/L (ref 98–111)
Creatinine, Ser: 0.96 mg/dL (ref 0.61–1.24)
GFR calc Af Amer: >60 mL/min (ref >60)
GFR calc non Af Amer: >60 mL/min (ref >60)
GLUCOSE: 121 mg/dL — AB (ref 70–99)
Potassium: 4.1 mmol/L (ref 3.5–5.1)
Sodium: 136 mmol/L (ref 135–145)

## 2018-03-21 MED ORDER — ACETAMINOPHEN 325 MG PO TABS
650.0000 mg | ORAL_TABLET | Freq: Four times a day (QID) | ORAL | Status: DC | PRN
  Administered 2018-03-21 – 2018-03-23 (×4): 650 mg via ORAL
  Filled 2018-03-21 (×4): qty 2

## 2018-03-21 MED ORDER — OXYCODONE HCL 5 MG PO TABS: 5.0000 mg | ORAL_TABLET | ORAL | Status: DC | PRN

## 2018-03-21 MED ORDER — MOVING RIGHT ALONG BOOK
Freq: Once | Status: DC
  Filled 2018-03-21: qty 1

## 2018-03-21 MED ORDER — ONDANSETRON HCL 4 MG PO TABS: 4.0000 mg | ORAL_TABLET | Freq: Four times a day (QID) | ORAL | Status: DC | PRN

## 2018-03-21 MED ORDER — DOCUSATE SODIUM 100 MG PO CAPS
200.0000 mg | ORAL_CAPSULE | Freq: Every day | ORAL | Status: DC
  Administered 2018-03-22 – 2018-03-23 (×2): 200 mg via ORAL
  Filled 2018-03-21 (×2): qty 2

## 2018-03-21 MED ORDER — SODIUM CHLORIDE 0.9% FLUSH: 3.0000 mL | INTRAVENOUS | Status: DC | PRN

## 2018-03-21 MED ORDER — ASPIRIN EC 325 MG PO TBEC
325.0000 mg | DELAYED_RELEASE_TABLET | Freq: Every day | ORAL | Status: DC
  Administered 2018-03-22 – 2018-03-23 (×2): 325 mg via ORAL
  Filled 2018-03-21 (×2): qty 1

## 2018-03-21 MED ORDER — TRAMADOL HCL 50 MG PO TABS
50.0000 mg | ORAL_TABLET | ORAL | Status: DC | PRN
  Administered 2018-03-22 (×2): 50 mg via ORAL
  Filled 2018-03-21 (×2): qty 1

## 2018-03-21 MED ORDER — SODIUM CHLORIDE 0.9 % IV SOLN: 250.0000 mL | INTRAVENOUS | Status: DC | PRN

## 2018-03-21 MED ORDER — METOPROLOL TARTRATE 12.5 MG HALF TABLET
12.5000 mg | ORAL_TABLET | Freq: Two times a day (BID) | ORAL | Status: DC
  Administered 2018-03-21: 12.5 mg via ORAL
  Filled 2018-03-21: qty 1

## 2018-03-21 MED ORDER — SODIUM CHLORIDE 0.9% FLUSH
3.0000 mL | Freq: Two times a day (BID) | INTRAVENOUS | Status: DC
  Administered 2018-03-21 – 2018-03-22 (×3): 3 mL via INTRAVENOUS

## 2018-03-21 MED ORDER — ONDANSETRON HCL 4 MG/2ML IJ SOLN
4.0000 mg | Freq: Four times a day (QID) | INTRAMUSCULAR | Status: DC | PRN
  Administered 2018-03-22: 4 mg via INTRAVENOUS
  Filled 2018-03-21: qty 2

## 2018-03-21 NOTE — Progress Notes (Signed)
      [address]       [place] [address]             [phone]      Resting comfortably  BP 113/78   Pulse 91   Temp 98.7 F (37.1 C) (Oral)   Resp (!) 29   Ht 6\' 1"  (1.854 m)   Wt 79.7 kg   SpO2 95%   BMI 23.18 kg/m   Intake/Output Summary (Last 24 hours) at 03/21/2018 1813 Last data filed at 03/21/2018 1300 Gross per 24 hour  Intake 1241.51 ml  Output 1325 ml  Net -83.49 ml   Awaiting bed on 4E  Steven C. Roxan Hockey, MD [facility] [phone]

## 2018-03-21 NOTE — Progress Notes (Signed)
2 Days Post-Op Procedure(s) (LRB): CORONARY ARTERY BYPASS GRAFTING (CABG), ON PUMP, TIMES FIVE, USING BILATERAL INTERNAL MAMMARY ARTERIES AND ENDOSCOPICALLY HARVESTED RIGHT GREATER SAPHENOUS VEIN (N/A) TRANSESOPHAGEAL ECHOCARDIOGRAM (TEE) (N/A) Subjective: No complaints, slept well, bowels working, walking well  Objective: Vital signs in last 24 hours: Temp:  [98.3 F (36.8 C)-99.3 F (37.4 C)] 99.2 F (37.3 C) (01/15 0400) Pulse Rate:  [81-101] 101 (01/15 0600) Cardiac Rhythm: Normal sinus rhythm (01/15 0400) Resp:  [12-24] 16 (01/15 0600) BP: (88-121)/(57-90) 112/75 (01/15 0600) SpO2:  [92 %-97 %] 95 % (01/15 0600) Arterial Line BP: (119)/(64) 119/64 (01/14 0800) Weight:  [79.7 kg] 79.7 kg (01/15 0500)  Hemodynamic parameters for last 24 hours: PAP: (20)/(6) 20/6  Intake/Output from previous day: 01/14 0701 - 01/15 0700 In: 1238.4 [P.O.:900; I.V.:238.4; IV Piggyback:100] Out: 1031 [Urine:3675; Chest Tube:80] Intake/Output this shift: No intake/output data recorded.  General appearance: alert and cooperative Heart: regular rate and rhythm, S1, S2 normal, no murmur, click, rub or gallop Lungs: clear to auscultation bilaterally Extremities: extremities normal, atraumatic, no cyanosis or edema Wound: dressings dry  Lab Results: Recent Labs    03/20/18 1700 03/20/18 1709 03/21/18 0400  WBC 14.2*  --  13.2*  HGB 11.7* 11.2* 10.8*  HCT 34.2* 33.0* 31.0*  PLT 182  --  167   BMET:  Recent Labs    03/20/18 0404  03/20/18 1709 03/21/18 0400  NA 135  --  136 136  K 4.3  --  4.2 4.1  CL 107  --  101 103  CO2 21*  --   --  27  GLUCOSE 119*  --  164* 121*  BUN 14  --  13 12  CREATININE 0.99   < > 0.90 0.96  CALCIUM 8.0*  --   --  8.2*   < > = values in this interval not displayed.    PT/INR:  Recent Labs    03/19/18 1353  LABPROT 15.9*  INR 1.28   ABG    Component Value Date/Time   PHART 7.332 (L) 03/19/2018 1830   HCO3 20.7 03/19/2018 1830   TCO2 24  03/20/2018 1709   ACIDBASEDEF 5.0 (H) 03/19/2018 1830   O2SAT 99.0 03/19/2018 1830   CBG (last 3)  Recent Labs    03/20/18 2031 03/21/18 0010 03/21/18 0404  GLUCAP 121* 126* 108*   CXR: clear  Assessment/Plan: S/P Procedure(s) (LRB): CORONARY ARTERY BYPASS GRAFTING (CABG), ON PUMP, TIMES FIVE, USING BILATERAL INTERNAL MAMMARY ARTERIES AND ENDOSCOPICALLY HARVESTED RIGHT GREATER SAPHENOUS VEIN (N/A) TRANSESOPHAGEAL ECHOCARDIOGRAM (TEE) (N/A)  POD 2 Hemodynamically stable in sinus rhythm. Continue low dose Lopressor. Titrate up as BP allows.  Diuresed well yesterday. Wt is 4 lbs over preop. Will follow for now.  Continue IS, ambulation.  Transfer to 4E.   LOS: 5 days    Gaye Pollack 03/21/2018

## 2018-03-21 NOTE — Discharge Summary (Addendum)
Physician Discharge Summary  Patient ID: Tim Strickland MRN: [id number] DOB/AGE: 03-19-1965 53 y.o.  Admit date: 03/16/2018 Discharge date: 03/23/2018  Admission Diagnoses:  Patient Active Problem List   Diagnosis Date Noted  . HTN (hypertension) 03/18/2018  . Unstable angina ([place]) 03/16/2018  . Angina pectoris ([place]) 01/25/2018  . Encounter for general adult medical examination with abnormal findings 11/04/2016  . Right rotator cuff tear 04/26/2016  . Shoulder impingement syndrome, right 02/23/2016  . Left wrist tendinitis 02/23/2016  . Synovitis of toe 02/23/2016  . Bursitis of right shoulder 01/15/2014  . Familial hyperlipidemia 12/05/2008  . Asthma 12/05/2008   Discharge Diagnoses:   Patient Active Problem List   Diagnosis Date Noted  . S/P CABG x 5   . HTN (hypertension) 03/18/2018  . Unstable angina ([place]) 03/16/2018  . Angina pectoris ([place]) 01/25/2018  . Encounter for general adult medical examination with abnormal findings 11/04/2016  . Right rotator cuff tear 04/26/2016  . Shoulder impingement syndrome, right 02/23/2016  . Left wrist tendinitis 02/23/2016  . Synovitis of toe 02/23/2016  . Bursitis of right shoulder 01/15/2014  . Familial hyperlipidemia 12/05/2008  . Asthma 12/05/2008   Discharged Condition: good  History of Present Illness:  Tim Strickland is a 53 yo white male with known history of Hyperlipidemia, HTN, and positive family history for CAD.  The patient noticed over the summer episodes of nausea, increased saliva production, and a band of chest tightness across his chest.  He states this would occur with exertion and would occasionally radiate down his left arm.  He was evaluated by Dr. Bettina Gavia who put patient on ASA therapy.  He was started on Telmisartan for BP control.  He also underwent a cardiac CT which showed evidence of CAD in the LAD.  It was felt with patient's angina and family history he should undergo cardiac catheterization.  This was done  electively today and he was found to have a preserved EF and severe 2 vessel CAD.  It was felt coronary bypass grafting would be indicated and TCTS consult was requested.  Hospital Course:   He remained chest pain free during hospitalization.  He was evaluated by Dr. Cyndia Bent who was in agreement the patient would benefit from coronary bypass grafting procedure.  The risks and benefits of the procedure were explained to the patient and she was agreeable to proceed.  He was taken to the operating room and underwent CABG x 5 utilizing LIMA to Diagonal 2, RIMA to LAD, SVG to Diagonal 1, and Sequential SVG to PL and PDA.  He also underwent endoscopic harvest of greater saphenous vein from the right leg.  He tolerated the procedure without difficulty and was taken to the SICU in stable condition.  He was weaned and extubated the evening of surgery.  During his stay in the SICU the patients arterial lines and chest tubes were removed without difficulty.  He was started on lasix for mild volume overloaded.  He was maintaining NSR.  He was ambulating independently and was medically stable for transfer to the telemetry unit on 03/22/2018.  He continues to do well.  He remains in NSR.  His pacing wires have been removed without difficulty.  He is tolerating zetia for lipid management and will start PCKS9 inhibitor on outpatient basis per Dr. Bettina Gavia.  He is ambulating independently.  His incisions are healing without evidence of infection.  He is medically stable for discharge home.  Significant Diagnostic Studies: angiography:    The left  ventricular systolic function is normal.  LV end diastolic pressure is normal.  The left ventricular ejection fraction is 55-65% by visual estimate.  Prox RCA lesion is 40% stenosed.  Dist RCA lesion is 65% stenosed.  Post Atrio lesion is 40% stenosed.  Prox Cx lesion is 40% stenosed.  Ost LAD lesion is 90% stenosed.  Prox LAD-1 lesion is 99% stenosed.  Prox LAD-2  lesion is 50% stenosed.  Ost 1st Diag to 1st Diag lesion is 50% stenosed.   1.  Severe two-vessel coronary artery disease involving the ostial LAD as well as proximal LAD at the bifurcation of a large diagonal branch, distal RCA disease as well.  All these lesions were significant by CT FFR.  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 2.  Normal LV systolic function and normal  left ventricular end-diastolic pressure.  Treatments: surgery:   1. Median Sternotomy 2. Extracorporeal circulation 3.   Coronary artery bypass grafting x 5   Right internal mammary artery graft to the LAD  Left internal mammary artery graft to the second diagonal  SVG to first diagonal  Sequential SVG to PDA and PL   4.   Endoscopic vein harvest from the right leg  Discharge Exam: Blood pressure 122/88, pulse 95, temperature 98 F (36.7 C), temperature source Oral, resp. rate 20, height 6\' 1"  (1.854 m), weight 77.9 kg, SpO2 97 %.   General appearance: alert, cooperative and no distress Heart: regular rate and rhythm Lungs: clear to auscultation bilaterally Abdomen: soft, non-tender; bowel sounds normal; no masses,  no organomegaly Extremities: edema trace Wound: clean and dry, mild ecchymosis at [facility] site  Discharge disposition: 01-Home or Self Care  Discharge Medications:  The patient has been discharged on:   1.Beta Blocker:  Yes [  X ]                              No   [   ]                              If No, reason:  2.Ace Inhibitor/ARB: Yes [   ]                                     No  [ X   ]                                     If No, reason: labile BP  3.Statin:   Yes [   ]                  No  Valu.Nieves   ]                  If No, reason:elevated LFTs  4.Ecasa:  Yes  [ X  ]                  No   [   ]                  If No, reason:     Discharge Instructions    Amb Referral to Cardiac Rehabilitation   Complete by:  As  directed    Diagnosis:  CABG   CABG X ___:  5     Allergies as of 03/23/2018      Reactions   Pravastatin Other (See Comments)   04/03/2009: AST 51 and ALT 101 on pravastatin 20 mg daily. LDL at that time was 192.6.      Medication List    STOP taking these medications   aspirin 81 MG tablet Replaced by:  aspirin 325 MG EC tablet   Pitavastatin Calcium 2 MG Tabs Commonly known as:  LIVALO   telmisartan 20 MG tablet Commonly known as:  MICARDIS     TAKE these medications   acetaminophen 325 MG tablet Commonly known as:  TYLENOL Take 2 tablets (650 mg total) by mouth every 6 (six) hours as needed for mild pain. What changed:    medication strength  how much to take  when to take this  reasons to take this   albuterol 108 (90 Base) MCG/ACT inhaler Commonly known as:  PROVENTIL HFA;VENTOLIN HFA Inhale 2 puffs into the lungs every 6 (six) hours as needed for wheezing or shortness of breath.   aspirin 325 MG EC tablet Take 1 tablet (325 mg total) by mouth daily. Replaces:  aspirin 81 MG tablet   Evolocumab 140 MG/ML Soaj Commonly known as:  REPATHA SURECLICK Inject 761 mg into the skin every 14 (fourteen) days.   ezetimibe 10 MG tablet Commonly known as:  ZETIA Take 1 tablet (10 mg total) by mouth daily.   metoprolol tartrate 25 MG tablet Commonly known as:  LOPRESSOR Take 1 tablet (25 mg total) by mouth 2 (two) times daily.   montelukast 10 MG tablet Commonly known as:  SINGULAIR TAKE 1 TABLET (10 MG TOTAL) BY MOUTH AT BEDTIME. MUST KEEP JAN APP FOR MORE What changed:  See the new instructions.   nitroGLYCERIN 0.4 MG SL tablet Commonly known as:  NITROSTAT Place 1 tablet (0.4 mg total) under the tongue every 5 (five) minutes as needed for chest pain.   traMADol 50 MG tablet Commonly known as:  ULTRAM Take 1 tablet (50 mg total) by mouth every 4 (four) hours as needed for moderate pain.      Follow-up Information    Gaye Pollack, MD Follow up on  04/16/2018.   Specialty:  Cardiothoracic Surgery Why:  Appointment is at 1:30, please get CXR at 1:00 at [place] located on first floor of our office building Contact information: [place] [address] [phone]        [facility] Follow up on 03/30/2018.   Specialty:  Cardiothoracic Surgery Why:  Appointment is at 11:00 for suture removal Contact information: [address] [place], [place] [phone]          Signed: Ellwood Handler 03/23/2018, 9:55 AM

## 2018-03-22 MED ORDER — METOPROLOL TARTRATE 25 MG PO TABS
25.0000 mg | ORAL_TABLET | Freq: Two times a day (BID) | ORAL | Status: DC
  Administered 2018-03-22 – 2018-03-23 (×3): 25 mg via ORAL
  Filled 2018-03-22 (×3): qty 1

## 2018-03-22 NOTE — Progress Notes (Signed)
3 Days Post-Op Procedure(s) (LRB): CORONARY ARTERY BYPASS GRAFTING (CABG), ON PUMP, TIMES FIVE, USING BILATERAL INTERNAL MAMMARY ARTERIES AND ENDOSCOPICALLY HARVESTED RIGHT GREATER SAPHENOUS VEIN (N/A) TRANSESOPHAGEAL ECHOCARDIOGRAM (TEE) (N/A) Subjective:  No complaints, ambulating well  Objective: Vital signs in last 24 hours: Temp:  [98.1 F (36.7 C)-98.7 F (37.1 C)] 98.1 F (36.7 C) (01/16 0719) Pulse Rate:  [87-102] 91 (01/16 0400) Cardiac Rhythm: Normal sinus rhythm (01/16 0400) Resp:  [15-29] 25 (01/16 0700) BP: (99-117)/(67-79) 102/76 (01/16 0600) SpO2:  [92 %-98 %] 98 % (01/16 0400) Weight:  [78.3 kg] 78.3 kg (01/16 0500)  Hemodynamic parameters for last 24 hours:    Intake/Output from previous day: 01/15 0701 - 01/16 0700 In: 503 [P.O.:500; I.V.:3] Out: 735 [Urine:735] Intake/Output this shift: No intake/output data recorded.  General appearance: alert and cooperative Heart: regular rate and rhythm, S1, S2 normal, no murmur, click, rub or gallop Lungs: clear to auscultation bilaterally Extremities: extremities normal, atraumatic, no cyanosis or edema Wound: incisions ok  Lab Results: Recent Labs    03/20/18 1700 03/20/18 1709 03/21/18 0400  WBC 14.2*  --  13.2*  HGB 11.7* 11.2* 10.8*  HCT 34.2* 33.0* 31.0*  PLT 182  --  167   BMET:  Recent Labs    03/20/18 0404  03/20/18 1709 03/21/18 0400  NA 135  --  136 136  K 4.3  --  4.2 4.1  CL 107  --  101 103  CO2 21*  --   --  27  GLUCOSE 119*  --  164* 121*  BUN 14  --  13 12  CREATININE 0.99   < > 0.90 0.96  CALCIUM 8.0*  --   --  8.2*   < > = values in this interval not displayed.    PT/INR:  Recent Labs    03/19/18 1353  LABPROT 15.9*  INR 1.28   ABG    Component Value Date/Time   PHART 7.332 (L) 03/19/2018 1830   HCO3 20.7 03/19/2018 1830   TCO2 24 03/20/2018 1709   ACIDBASEDEF 5.0 (H) 03/19/2018 1830   O2SAT 99.0 03/19/2018 1830   CBG (last 3)  Recent Labs    03/20/18 2031  03/21/18 0010 03/21/18 0404  GLUCAP 121* 126* 108*    Assessment/Plan: S/P Procedure(s) (LRB): CORONARY ARTERY BYPASS GRAFTING (CABG), ON PUMP, TIMES FIVE, USING BILATERAL INTERNAL MAMMARY ARTERIES AND ENDOSCOPICALLY HARVESTED RIGHT GREATER SAPHENOUS VEIN (N/A) TRANSESOPHAGEAL ECHOCARDIOGRAM (TEE) (N/A)  POD 3 Hemodynamically stable in sinus rhythm 98 at rest. Will increase Lopressor to 25 bid and see how BP tolerates it.   Wt about back to baseline.  Plan to resume his previous cholesterol meds at discharge.  Awaiting bed on 4E.  Plan to send home tomorrow if no arrhythmias.   LOS: 6 days    Gaye Pollack 03/22/2018

## 2018-03-23 MED ORDER — ASPIRIN 325 MG PO TBEC: 325.0000 mg | DELAYED_RELEASE_TABLET | Freq: Every day | ORAL | 0 refills | Status: DC

## 2018-03-23 MED ORDER — METOPROLOL TARTRATE 25 MG PO TABS: 25.0000 mg | ORAL_TABLET | Freq: Two times a day (BID) | ORAL | 3 refills | Status: DC

## 2018-03-23 MED ORDER — EZETIMIBE 10 MG PO TABS: 10.0000 mg | ORAL_TABLET | Freq: Every day | ORAL | 3 refills | Status: DC

## 2018-03-23 MED ORDER — TRAMADOL HCL 50 MG PO TABS: 50.0000 mg | ORAL_TABLET | ORAL | 0 refills | Status: DC | PRN

## 2018-03-23 MED ORDER — ACETAMINOPHEN 325 MG PO TABS: 650.0000 mg | ORAL_TABLET | Freq: Four times a day (QID) | ORAL | Status: DC | PRN

## 2018-03-23 MED FILL — Lidocaine HCl(Cardiac) IV PF Soln Pref Syr 100 MG/5ML (2%): INTRAVENOUS | Qty: 5 | Status: AC

## 2018-03-23 MED FILL — Mannitol IV Soln 20%: INTRAVENOUS | Qty: 500 | Status: AC

## 2018-03-23 MED FILL — Electrolyte-R (PH 7.4) Solution: INTRAVENOUS | Qty: 4000 | Status: AC

## 2018-03-23 MED FILL — Heparin Sodium (Porcine) Inj 1000 Unit/ML: INTRAMUSCULAR | Qty: 30 | Status: AC

## 2018-03-23 MED FILL — Heparin Sodium (Porcine) Inj 1000 Unit/ML: INTRAMUSCULAR | Qty: 10 | Status: AC

## 2018-03-23 MED FILL — Sodium Chloride IV Soln 0.9%: INTRAVENOUS | Qty: 2000 | Status: AC

## 2018-03-23 NOTE — Care Management Note (Signed)
Case Management Note Marvetta Gibbons RN, BSN Transitions of Care Unit 4E- RN Case Manager [phone]  Patient Details  Name: Tim Strickland MRN: [id number] Date of Birth: 1965/09/03  Subjective/Objective:    Pt admitted with [place], s/p CABG x5                Action/Plan: PTA pt lived at home with spouse, plan to return home, no CM needs noted for transition home.   Expected Discharge Date:  03/23/18               Expected Discharge Plan:  Home/Self Care  In-House Referral:  NA  Discharge planning Services  CM Consult  Post Acute Care Choice:  NA Choice offered to:  NA  DME Arranged:    DME Agency:     HH Arranged:    HH Agency:     Status of Service:  Completed, signed off  If discussed at [place] of Stay Meetings, dates discussed:    Discharge Disposition: home/self care   Additional Comments:  Dawayne Patricia, RN 03/23/2018, 12:00 PM

## 2018-03-23 NOTE — Discharge Instructions (Signed)

## 2018-03-23 NOTE — Progress Notes (Signed)
CARDIAC REHAB PHASE I    Offered to walk with pt, pt states he has walked 4 laps already this am. D/c ed completed with pt. Pt instructed to shower and monitor incisions daily. Encouraged continued IS use, walks, and sternal precautions. Pt given in-the-tube sheet, and heart healthy diet. Reviewed restrictions and exercise guidelines. Will refer to CRP II [place].  [id number] Rufina Falco, RN BSN 03/23/2018 9:45 AM

## 2018-03-23 NOTE — Progress Notes (Addendum)
      [address]       [place] [address]             [phone]      4 Days Post-Op Procedure(s) (LRB): CORONARY ARTERY BYPASS GRAFTING (CABG), ON PUMP, TIMES FIVE, USING BILATERAL INTERNAL MAMMARY ARTERIES AND ENDOSCOPICALLY HARVESTED RIGHT GREATER SAPHENOUS VEIN (N/A) TRANSESOPHAGEAL ECHOCARDIOGRAM (TEE) (N/A)   Subjective:  No new complaints.  He is feeling good, states slept well last night.  Ready to go home.  Objective: Vital signs in last 24 hours: Temp:  [98 F (36.7 C)-98.9 F (37.2 C)] 98 F (36.7 C) (01/17 0511) Pulse Rate:  [93-101] 95 (01/17 0511) Cardiac Rhythm: Sinus tachycardia (01/16 1929) Resp:  [15-26] 20 (01/17 0511) BP: (103-131)/(62-88) 113/83 (01/17 0511) SpO2:  [94 %-99 %] 97 % (01/17 0511) Weight:  [77.9 kg] 77.9 kg (01/17 0511)  Intake/Output from previous day: 01/16 0701 - 01/17 0700 In: 500 [P.O.:500] Out: -   General appearance: alert, cooperative and no distress Heart: regular rate and rhythm Lungs: clear to auscultation bilaterally Abdomen: soft, non-tender; bowel sounds normal; no masses,  no organomegaly Extremities: edema trace Wound: clean and dry, mild ecchymosis at [facility] site  Lab Results: Recent Labs    03/20/18 1700 03/20/18 1709 03/21/18 0400  WBC 14.2*  --  13.2*  HGB 11.7* 11.2* 10.8*  HCT 34.2* 33.0* 31.0*  PLT 182  --  167   BMET:  Recent Labs    03/20/18 1709 03/21/18 0400  NA 136 136  K 4.2 4.1  CL 101 103  CO2  --  27  GLUCOSE 164* 121*  BUN 13 12  CREATININE 0.90 0.96  CALCIUM  --  8.2*    PT/INR: No results for input(s): LABPROT, INR in the last 72 hours. ABG    Component Value Date/Time   PHART 7.332 (L) 03/19/2018 1830   HCO3 20.7 03/19/2018 1830   TCO2 24 03/20/2018 1709   ACIDBASEDEF 5.0 (H) 03/19/2018 1830   O2SAT 99.0 03/19/2018 1830   CBG (last 3)  Recent Labs    03/20/18 2031 03/21/18 0010 03/21/18 0404  GLUCAP 121* 126* 108*    Assessment/Plan: S/P  Procedure(s) (LRB): CORONARY ARTERY BYPASS GRAFTING (CABG), ON PUMP, TIMES FIVE, USING BILATERAL INTERNAL MAMMARY ARTERIES AND ENDOSCOPICALLY HARVESTED RIGHT GREATER SAPHENOUS VEIN (N/A) TRANSESOPHAGEAL ECHOCARDIOGRAM (TEE) (N/A)  1. CV- mild Sinus Tachycardia, BP is labile- continue Lopressor 2. Pulm- no acute issues, continue IS 3. Renal-creatinine WNL, no edema on exam, no Lasix at this time 4. Lipid management- continue Zetia, patient instructed to contact Dr. Bettina Gavia in regards to PCKS9 5. Dispo- patient stable, will home today    LOS: 7 days    Ellwood Handler 03/23/2018   Chart reviewed, patient examined, agree with above. He looks great and has had no atrial fib postop.  Will follow up with Dr. Bettina Gavia for cardiology care and will decide at that time about lipid management. Apparently plans were to put him on Livalo and Repatha but he says he never started them and was started on Zetia here.

## 2018-03-23 NOTE — Progress Notes (Signed)
IV and telemetry discontinued at this time. CCMD notified. Discharge instructions reviewed with patient and all questions answered.   Emelda Fear, RN

## 2018-03-24 ENCOUNTER — Other Ambulatory Visit: Payer: Self-pay | Admitting: Cardiology

## 2018-03-29 ENCOUNTER — Ambulatory Visit: Payer: 59

## 2018-03-30 ENCOUNTER — Ambulatory Visit (INDEPENDENT_AMBULATORY_CARE_PROVIDER_SITE_OTHER): Payer: Self-pay

## 2018-03-30 ENCOUNTER — Other Ambulatory Visit: Payer: Self-pay

## 2018-03-30 DIAGNOSIS — Z4802 Encounter for removal of sutures: Secondary | ICD-10-CM

## 2018-03-30 NOTE — Progress Notes (Signed)
Patient arrived for nurse visit to remove sutures post- procedure CABG x4 03/19/2018 with Dr. Cyndia Bent.  Sutures removed with no signs/ symptoms of infection noted.  Patient tolerated procedure well.  Patient instructed to keep the incision sites clean and dry.  Patient/ family acknowledged instructions given.

## 2018-04-04 ENCOUNTER — Telehealth (HOSPITAL_COMMUNITY): Payer: Self-pay

## 2018-04-04 NOTE — Telephone Encounter (Signed)
Pt insurance is active and benefits verified through [place]. Co-pay $0.00, DED $2,000.00/$1,072.55 met, out of pocket $11,000.00/$1,694.02 met, co-insurance 70%. No pre-authorization required. Fax, 04/02/2018, REF# [id number]/Fax  Will contact patient to see if he is interested in the Cardiac Rehab Program. If interested, patient will need to complete follow up appt. Once completed, patient will be contacted for scheduling upon review by the RN Navigator.

## 2018-04-04 NOTE — Telephone Encounter (Signed)
Called patient to see if he is interested in the Cardiac Rehab Program. Patient expressed interest. Explained scheduling process and went over insurance, patient verbalized understanding. Will contact patient for scheduling once f/u has been completed.  °

## 2018-04-13 ENCOUNTER — Other Ambulatory Visit: Payer: Self-pay | Admitting: Surgery

## 2018-04-13 DIAGNOSIS — Z951 Presence of aortocoronary bypass graft: Secondary | ICD-10-CM

## 2018-04-16 ENCOUNTER — Ambulatory Visit (INDEPENDENT_AMBULATORY_CARE_PROVIDER_SITE_OTHER): Payer: Self-pay | Admitting: Physician Assistant

## 2018-04-16 ENCOUNTER — Ambulatory Visit
Admission: RE | Admit: 2018-04-16 | Discharge: 2018-04-16 | Disposition: A | Discharge status: Home / Self Care | Payer: 59 | Source: Ambulatory Visit | Attending: Surgery | Admitting: Surgery

## 2018-04-16 ENCOUNTER — Ambulatory Visit: Payer: Self-pay

## 2018-04-16 ENCOUNTER — Other Ambulatory Visit: Payer: Self-pay

## 2018-04-16 VITALS — BP 110/78 | HR 78 | Resp 18 | Ht 73.0 in | Wt 176.0 lb

## 2018-04-16 DIAGNOSIS — Z951 Presence of aortocoronary bypass graft: Secondary | ICD-10-CM

## 2018-04-16 DIAGNOSIS — I2510 Atherosclerotic heart disease of native coronary artery without angina pectoris: Secondary | ICD-10-CM

## 2018-04-16 NOTE — Patient Instructions (Signed)

## 2018-04-16 NOTE — Progress Notes (Signed)
HPI: Patient returns for routine postoperative follow-up having undergone CABG x 5 on 04/19/2018. The patient's early postoperative recovery while in the hospital was as expected and without complications.  Since hospital discharge the patient reports he is doing very well.  He does have some numbness in his right lower leg and along the left lateral aspect of his thigh.  He also notes that he is experiencing some numbness across his chest.  He is ambulating 45 min without difficulty.  He wants to go to cardiac rehab.    Current Outpatient Medications  Medication Sig Dispense Refill  . acetaminophen (TYLENOL) 325 MG tablet Take 2 tablets (650 mg total) by mouth every 6 (six) hours as needed for mild pain.    Marland Kitchen albuterol (PROVENTIL HFA;VENTOLIN HFA) 108 (90 Base) MCG/ACT inhaler Inhale 2 puffs into the lungs every 6 (six) hours as needed for wheezing or shortness of breath. 1 Inhaler 3  . aspirin EC 325 MG EC tablet Take 1 tablet (325 mg total) by mouth daily. 30 tablet 0  . ezetimibe (ZETIA) 10 MG tablet Take 1 tablet (10 mg total) by mouth daily. 30 tablet 3  . metoprolol tartrate (LOPRESSOR) 25 MG tablet Take 1 tablet (25 mg total) by mouth 2 (two) times daily. 60 tablet 3  . montelukast (SINGULAIR) 10 MG tablet TAKE 1 TABLET (10 MG TOTAL) BY MOUTH AT BEDTIME. MUST KEEP JAN APP FOR MORE (Patient taking differently: Take 10 mg by mouth at bedtime as needed (allergies). ) 90 tablet 1  . nitroGLYCERIN (NITROSTAT) 0.4 MG SL tablet Place 1 tablet (0.4 mg total) under the tongue every 5 (five) minutes as needed for chest pain. 25 tablet 11  . Evolocumab (REPATHA SURECLICK) 818 MG/ML SOAJ Inject 140 mg into the skin every 14 (fourteen) days. (Patient not taking: Reported on 04/16/2018)     No current facility-administered medications for this visit.     Physical Exam:  BP 110/78 (BP Location: Right Arm, Patient Position: Sitting, Cuff Size: Normal)   Pulse 78   Resp 18   Ht 6\' 1"  (1.854 m)   Wt 176  lb (79.8 kg)   SpO2 97% Comment: RA  BMI 23.22 kg/m   Gen: no apparent distress, looks great Heart: RRR Lungs: CTA bilaterally Abd: soft, non-tender, non-distended Ext: no edema Incisions: well healed  Diagnostic Tests:  CXR: no pleural effusion, pneumothorax  A/P:  1. S/p CABG x 5- doing very well, okay to start cardiac rehab 2. Activity- increase as tolerated, re-iterated lifting precautions 3. May resume driving 4. Okay to attend festival in [place], knows to attend hospital if he feels poorly 5. Dispo- patient stable, RTC prn   Ellwood Handler, PA-C [facility] [phone]

## 2018-04-18 NOTE — Progress Notes (Signed)
Cardiology Office Note:    Date:  04/19/2018   ID:  Tim Strickland, DOB 02-13-1966, MRN [id number]  PCP:  Lance Sell, NP  Cardiologist:  Shirlee More, MD    Referring MD: Lance Sell, NP    ASSESSMENT:    1. Familial hyperlipidemia   2. Coronary artery disease of native artery of native heart with stable angina pectoris ([place])   3. Meralgia paraesthetica, left    PLAN:    In order of problems listed above:  1. Continue Zetia refer to lipid clinic for initiation of PCSK9 Repatha as he is statin intolerant.  I asked him to contact his siblings and they should be screened 2. 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 3. Unusual after bypass surgery he has isolated dysesthesia over the left anterior and lateral thigh and had no intervention in the left lower extremity.   Next appointment: 3 months   Medication Adjustments/Labs and Tests Ordered: Current medicines are reviewed at length with the patient today.  Concerns regarding medicines are outlined above.  Orders Placed This Encounter  Procedures  . AMB Referral to [facility] ordered this encounter  Medications  . Evolocumab (REPATHA SURECLICK) 326 MG/ML SOAJ    Sig: Inject 140 mg into the skin every 14 (fourteen) days.  Marland Kitchen aspirin EC 81 MG tablet    Sig: Take 1 tablet (81 mg total) by mouth daily.    Dispense:  90 tablet    Refill:  3    Chief Complaint  Patient presents with  . Coronary Artery Disease    History of Present Illness:    Tim Strickland is a 53 y.o. male with a hx of CAD and recent CABG last seen 03/15/18. Compliance with diet, lifestyle and medications: Yes  He is improving he is in good spirits he tried to return to work but he is unable to lift and carry.  No  wound drainage fever chills shortness of breath palpitation or syncope but still has some incisional sternal pain.  He has returned to driving the car.  Since hospitalization he has had dysesthesia in the left thigh which is persistent  CABG 03/19/18:   Procedure: 1.    Median Sternotomy 4. Extracorporeal circulation 3.   Coronary artery bypass grafting x 5  Right internal mammary artery graft to the LAD  Left internal mammary artery graft to the second diagonal  SVG to first diagonal  Sequential SVG to PDA and PL 4.   Endoscopic vein harvest from the right leg  Left heart cath 03/16/18:   Conclusion     The left ventricular systolic function is normal.  LV end diastolic pressure is normal.  The left ventricular ejection fraction is 55-65% by visual estimate.  Prox RCA lesion is 40% stenosed.  Dist RCA lesion is 65% stenosed.  Post Atrio lesion is 40% stenosed.  Prox Cx lesion is 40% stenosed.  Ost LAD lesion is 90% stenosed.  Prox LAD-1 lesion is 99% stenosed.  Prox LAD-2 lesion is 50% stenosed.  Ost 1st Diag to 1st Diag lesion is 50% stenosed.    1.  Severe two-vessel coronary artery disease involving the ostial LAD as well as proximal LAD at the bifurcation of a large diagonal branch, distal RCA disease as well.  All  these lesions were significant by CT FFR.  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 2.  Normal LV systolic function and normal  left ventricular end-diastolic pressure.  Recommendations: Given the involvement of ostial LAD as well as bifurcation LAD stenosis at the origin of very large diagonal branch that has borderline significant proximal disease, I think CABG is a better long-term option. Given severity of disease and symptoms, the patient will be admitted to the hospital and start unfractionated heparin 8 hours after sheath pull. I consulted CVTS for CABG.    Past Medical History:    Diagnosis Date  . Coronary artery disease   . Elevated liver enzymes    PMH of  on Pravachol  . Environmental asthma   . Hyperglycemia    A1c 5.2% in 2010  . Hyperlipidemia   . Hypertension     Past Surgical History:  Procedure Laterality Date  . APPENDECTOMY    . CORONARY ARTERY BYPASS GRAFT N/A 03/19/2018   Procedure: CORONARY ARTERY BYPASS GRAFTING (CABG), ON PUMP, TIMES FIVE, USING BILATERAL INTERNAL MAMMARY ARTERIES AND ENDOSCOPICALLY HARVESTED RIGHT GREATER SAPHENOUS VEIN;  Surgeon: Gaye Pollack, MD;  Location: [facility];  Service: Open Heart Surgery;  Laterality: N/A;  . LEFT HEART CATH AND CORONARY ANGIOGRAPHY N/A 03/16/2018   Procedure: LEFT HEART CATH AND CORONARY ANGIOGRAPHY;  Surgeon: Wellington Hampshire, MD;  Location: [facility];  Service: Cardiovascular;  Laterality: N/A;  . TEE WITHOUT CARDIOVERSION N/A 03/19/2018   Procedure: TRANSESOPHAGEAL ECHOCARDIOGRAM (TEE);  Surgeon: Gaye Pollack, MD;  Location: [facility];  Service: Open Heart Surgery;  Laterality: N/A;  . TONSILLECTOMY    . VASECTOMY    . WISDOM TOOTH EXTRACTION      Current Medications: Current Meds  Medication Sig  . albuterol (PROVENTIL HFA;VENTOLIN HFA) 108 (90 Base) MCG/ACT inhaler Inhale 2 puffs into the lungs every 6 (six) hours as needed for wheezing or shortness of breath.  . ezetimibe (ZETIA) 10 MG tablet Take 1 tablet (10 mg total) by mouth daily.  . metoprolol tartrate (LOPRESSOR) 25 MG tablet Take 1 tablet (25 mg total) by mouth 2 (two) times daily.  . montelukast (SINGULAIR) 10 MG tablet TAKE 1 TABLET (10 MG TOTAL) BY MOUTH AT BEDTIME. MUST KEEP JAN APP FOR MORE (Patient taking differently: Take 10 mg by mouth at bedtime as needed (allergies). )  . nitroGLYCERIN (NITROSTAT) 0.4 MG SL tablet Place 1 tablet (0.4 mg total) under the tongue every 5 (five) minutes as needed for chest pain.  . [DISCONTINUED] acetaminophen (TYLENOL) 325 MG tablet Take 2 tablets (650 mg total) by mouth every 6 (six)  hours as needed for mild pain.  . [DISCONTINUED] aspirin EC 325 MG EC tablet Take 1 tablet (325 mg total) by mouth daily.     Allergies:   Pravastatin   Social History   Socioeconomic History  . Marital status: Married    Spouse name: Not on file  . Number of children: 2  . Years of education: 14  . Highest education level: Not on file  Occupational History  . Occupation: Programme researcher, broadcasting/film/video  Social Needs  . Financial resource strain: Not on file  . Food insecurity:    Worry: Not on file    Inability: Not on file  . Transportation needs:    Medical: Not on file    Non-medical: Not on file  Tobacco Use  . Smoking status: Never Smoker  . Smokeless  tobacco: Never Used  Substance and Sexual Activity  . Alcohol use: Yes    Alcohol/week: 18.0 standard drinks    Types: 18 Cans of beer per week    Comment: 03/16/2018 "2-3 beers daily"  . Drug use: Not Currently  . Sexual activity: Yes  Lifestyle  . Physical activity:    Days per week: Not on file    Minutes per session: Not on file  . Stress: Not on file  Relationships  . Social connections:    Talks on phone: Not on file    Gets together: Not on file    Attends religious service: Not on file    Active member of club or organization: Not on file    Attends meetings of clubs or organizations: Not on file    Relationship status: Not on file  Other Topics Concern  . Not on file  Social History Narrative  . Not on file     Family History: The patient's family history includes Autoimmune disease in his mother; Cancer (age of onset: 48) in his paternal uncle; Diabetes in his sister; Gout in his paternal grandfather; Healthy in his mother; Heart attack in his father and paternal grandfather; Mental illness in his maternal grandmother; Stroke in his paternal grandmother. ROS:   Please see the history of present illness.    All other systems reviewed and are negative.  EKGs/Labs/Other Studies Reviewed:    The following studies were  reviewed today:    Recent Labs: 03/18/2018: ALT 50 03/20/2018: Magnesium 2.4 03/21/2018: BUN 12; Creatinine, Ser 0.96; Hemoglobin 10.8; Platelets 167; Potassium 4.1; Sodium 136  Recent Lipid Panel    Component Value Date/Time   CHOL 239 (H) 01/25/2018 0926   CHOL 178 01/15/2014 0801   TRIG 87 01/25/2018 0926   TRIG 233 (H) 01/15/2014 0801   HDL 68 01/25/2018 0926   HDL 62 01/15/2014 0801   CHOLHDL 3.5 01/25/2018 0926   CHOLHDL 4 11/02/2016 0829   VLDL 13.8 11/02/2016 0829   LDLCALC 154 (H) 01/25/2018 0926   LDLCALC 69 01/15/2014 0801   LDLDIRECT 205.1 12/26/2012 0938    Physical Exam:    VS:  BP 128/90 (BP Location: Right Arm, Patient Position: Sitting, Cuff Size: Normal)   Pulse 70   Ht 6\' 1"  (1.854 m)   Wt 175 lb 1.9 oz (79.4 kg)   SpO2 98%   BMI 23.10 kg/m     Wt Readings from Last 3 Encounters:  04/19/18 175 lb 1.9 oz (79.4 kg)  04/16/18 176 lb (79.8 kg)  03/23/18 171 lb 11.8 oz (77.9 kg)     GEN:  Well nourished, well developed in no acute distress HEENT: Normal NECK: No JVD; No carotid bruits LYMPHATICS: No lymphadenopathy CARDIAC: RRR, no murmurs, rubs, gallops RESPIRATORY:  Clear to auscultation without rales, wheezing or rhonchi  ABDOMEN: Soft, non-tender, non-distended MUSCULOSKELETAL:  No edema; No deformity  SKIN: Warm and dry NEUROLOGIC:  Alert and oriented x 3 PSYCHIATRIC:  Normal affect    Signed, Shirlee More, MD  04/19/2018 11:55 AM    [place]

## 2018-04-19 ENCOUNTER — Encounter: Payer: Self-pay | Admitting: Cardiology

## 2018-04-19 ENCOUNTER — Ambulatory Visit (INDEPENDENT_AMBULATORY_CARE_PROVIDER_SITE_OTHER): Payer: 59 | Admitting: Cardiology

## 2018-04-19 VITALS — BP 128/90 | HR 70 | Ht 73.0 in | Wt 175.1 lb

## 2018-04-19 DIAGNOSIS — E7849 Other hyperlipidemia: Secondary | ICD-10-CM | POA: Diagnosis not present

## 2018-04-19 DIAGNOSIS — I25118 Atherosclerotic heart disease of native coronary artery with other forms of angina pectoris: Secondary | ICD-10-CM

## 2018-04-19 DIAGNOSIS — G5712 Meralgia paresthetica, left lower limb: Secondary | ICD-10-CM

## 2018-04-19 HISTORY — DX: Meralgia paresthetica, left lower limb: G57.12

## 2018-04-19 MED ORDER — EVOLOCUMAB 140 MG/ML ~~LOC~~ SOAJ: 140.0000 mg | SUBCUTANEOUS | Status: DC

## 2018-04-19 MED ORDER — ASPIRIN EC 81 MG PO TBEC: 81.0000 mg | DELAYED_RELEASE_TABLET | Freq: Every day | ORAL | 3 refills | Status: AC

## 2018-04-19 NOTE — Patient Instructions (Addendum)
Medication Instructions:  Your physician has recommended you make the following change in your medication:   DISCONTINUE aspirin 325 mg daily START  - aspirin 81 mg (1 Tablet) daily  - Repatha injections as directed by Lipid clinic   If you need a refill on your cardiac medications before your next appointment, please call your pharmacy.   Lab work: NONE    Testing/Procedures: You have been referred to the lipid clinic to start Repatha injections. Someone will contact you to schedule an appointment.  You have been referred to cardiac rehab. Please contact Danae Orleans, RN at [facility] cardiac rehab, Phone# [phone] to schedule your appointment.  Follow-Up: At [facility], you and your health needs are our priority.  As part of our continuing mission to provide you with exceptional heart care, we have created designated Provider Care Teams.  These Care Teams include your primary Cardiologist (physician) and Advanced Practice Providers (APPs -  Physician Assistants and Nurse Practitioners) who all work together to provide you with the care you need, when you need it. You will need a follow up appointment in 3 months.  Please call our office 2 months in advance to schedule this appointment.  You may see Shirlee More, MDAny Other Special Instructions Will Be Listed Below:      Aspirin and Your Heart  Aspirin is a medicine that prevents the cells in the blood that are used for clotting, called platelets, from sticking together. Aspirin can be used to help reduce the risk of blood clots, heart attacks, and other heart-related problems. Can I take aspirin? Your health care provider will help you determine whether it is safe and beneficial for you to take aspirin daily. Taking aspirin daily may be helpful if you:  Have had a heart attack or chest pain.  Are at risk for a heart attack.  Have undergone open-heart surgery, such as coronary artery bypass surgery (CABG).  Have had  coronary angioplasty or a stent.  Have had certain types of stroke or transient ischemic attack (TIA).  Have peripheral artery disease (PAD).  Have chronic heart rhythm problems such as atrial fibrillation and cannot take an anticoagulant.  Have valve disease or have had surgery on a valve. What are the risks? Daily use of aspirin can cause side effects. Some of these include:  Bleeding. Bleeding problems can be minor or serious. An example of a minor problem is a cut that does not stop bleeding. An example of a more serious problem is stomach bleeding or, rarely, bleeding into the brain. Your risk of bleeding is increased if you are also taking non-steroidal anti-inflammatory drugs (NSAIDs).  Increased bruising.  Upset stomach.  An allergic reaction. People who have nasal polyps have an increased risk of developing an aspirin allergy. General guidelines  Take aspirin only as told by your health care provider. Make sure that you understand how much you should take and what form you should take. The two forms of aspirin are: ? Non-enteric-coated.This type of aspirin does not have a coating and is absorbed quickly. This type of aspirin also comes in a chewable form. ? Enteric-coated. This type of aspirin has a coating that releases the medicine very slowly. Enteric-coated aspirin might cause less stomach upset than non-enteric-coated aspirin. This type of aspirin should not be chewed or crushed.  Limit alcohol intake to no more than 1 drink a day for nonpregnant women and 2 drinks a day for men. Drinking alcohol increases your risk of bleeding.  One drink equals 12 oz of beer, 5 oz of wine, or 1 oz of hard liquor. Contact a health care provider if you:  Have unusual bleeding or bruising.  Have stomach pain or nausea.  Have ringing in your ears.  Have an allergic reaction that causes: ? Hives. ? Itchy skin. ? Swelling of the lips, tongue, or face. Get help right away if  you:  Notice that your bowel movements are bloody, dark red, or black in color.  Vomit or cough up blood.  Have blood in your urine.  Cough, have noisy breathing (wheeze), or feel short of breath.  Have chest pain, especially if the pain spreads to the arms, back, neck, or jaw.  Have a severe headache, or a headache with confusion, or dizziness. These symptoms may represent a serious problem that is an emergency. Do not wait to see if the symptoms will go away. Get medical help right away. Call your local emergency services (911 in the [place] Do not drive yourself to the hospital. Summary  Aspirin can be used to help reduce the risk of blood clots, heart attacks, and other heart-related problems.  Daily use of aspirin can increase your risk of side effects. Your health care provider will help you determine whether it is safe and beneficial for you to take aspirin daily.  Take aspirin only as told by your health care provider. Make sure that you understand how much you can take and what form you can take. This information is not intended to replace advice given to you by your health care provider. Make sure you discuss any questions you have with your health care provider. Document Released: 02/04/2008 Document Revised: 12/22/2016 Document Reviewed: 12/22/2016 Elsevier Interactive Patient Education  2019 [place] injection What is this medicine? EVOLOCUMAB (e voe LOK ue mab) is known as a PCSK9 inhibitor. It is used to lower the level of cholesterol in the blood. It may be used alone or in combination with other cholesterol-lowering drugs. This drug may also be used to reduce the risk of heart attack, stroke, and certain types of heart surgery in patients with heart disease. This medicine may be used for other purposes; ask your health care provider or pharmacist if you have questions. COMMON BRAND NAME(S): Repatha What should I tell my health care provider before I  take this medicine? They need to know if you have any of these conditions: -an unusual or allergic reaction to evolocumab, other medicines, foods, dyes, or preservatives -pregnant or trying to get pregnant -breast-feeding How should I use this medicine? This medicine is for injection under the skin. You will be taught how to prepare and give this medicine. Use exactly as directed. Take your medicine at regular intervals. Do not take your medicine more often than directed. It is important that you put your used needles and syringes in a special sharps container. Do not put them in a trash can. If you do not have a sharps container, call your pharmacist or health care provider to get one. Talk to your pediatrician regarding the use of this medicine in children. While this drug may be prescribed for children as young as 13 years for selected conditions, precautions do apply. Overdosage: If you think you have taken too much of this medicine contact a poison control center or emergency room at once. NOTE: This medicine is only for you. Do not share this medicine with others. What if I miss a dose? If you  miss a dose, take it as soon as you can if there are more than 7 days until the next scheduled dose, or skip the missed dose and take the next dose according to your original schedule. Do not take double or extra doses. What may interact with this medicine? Interactions are not expected. This list may not describe all possible interactions. Give your health care provider a list of all the medicines, herbs, non-prescription drugs, or dietary supplements you use. Also tell them if you smoke, drink alcohol, or use illegal drugs. Some items may interact with your medicine. What should I watch for while using this medicine? You may need blood work while you are taking this medicine. What side effects may I notice from receiving this medicine? Side effects that you should report to your doctor or health care  professional as soon as possible: -allergic reactions like skin rash, itching or hives, swelling of the face, lips, or tongue -signs and symptoms of high blood sugar such as dizziness; dry mouth; dry skin; fruity breath; nausea; stomach pain; increased hunger or thirst; increased urination -signs and symptoms of infection like fever or chills; cough; sore throat; pain or trouble passing urine Side effects that usually do not require medical attention (report to your doctor or health care professional if they continue or are bothersome): -diarrhea -nausea -muscle pain -pain, redness, or irritation at site where injected This list may not describe all possible side effects. Call your doctor for medical advice about side effects. You may report side effects to FDA at 1-800-FDA-1088. Where should I keep my medicine? Keep out of the reach of children. You will be instructed on how to store this medicine. Throw away any unused medicine after the expiration date on the label. NOTE: This sheet is a summary. It may not cover all possible information. If you have questions about this medicine, talk to your doctor, pharmacist, or health care provider.  2019 Elsevier/Gold Standard (2016-10-03 13:31:00)   Cardiac Rehabilitation What is cardiac rehabilitation? Cardiac rehabilitation is a treatment program that helps improve the health and well-being of people who have heart problems. Cardiac rehabilitation includes exercise training, education, and counseling to help you get stronger and return to an active lifestyle. This program can help you get better faster and reduce any future hospital stays. Why might I need cardiac rehabilitation? Cardiac rehabilitation programs can help when you have or have had:  A heart attack.  Heart failure.  Peripheral artery disease.  Coronary artery disease.  Angina.  Lung or breathing problems. Cardiac rehabilitation programs are also used when you have  had:  Coronary artery bypass graft surgery.  Heart valve replacement.  Heart stent placement.  Heart transplant.  Aneurysm repair. What are the benefits of cardiac rehabilitation? Cardiac rehabilitation can help:  Reduce problems like chest pain and trouble breathing.  Change risk factors that contribute to heart disease, such as: ? Smoking. ? High blood pressure. ? High cholesterol. ? Diabetes. ? Being out of shape or not active. ? Weighing more than 30% higher than your ideal weight. ? Diet.  Improve your mental outlook so you feel: ? More hopeful. ? Better about yourself. ? More confident about taking care of yourself.  Get support from health experts as well as other people with similar problems.  Learn how to manage and understand your medicines.  Teach your family about your condition and how to participate in your recovery. What happens in cardiac rehabilitation? You will be assessed by a  cardiac rehabilitation team. They will check your health history and do a physical exam. You may need blood tests, stress tests, and other evaluations to make sure that you are ready to start cardiac rehabilitation. The cardiac rehabilitation team works with you to make a plan based on your health and goals. Your program will be tailored to fit you and your needs and may change as you progress. You may work with a health care team that includes:  Doctors.  Nurses.  Dietitians.  Psychologists.  Exercise specialists.  Physical and occupational therapists. What are the phases of cardiac rehabilitation? A cardiac rehabilitation program is often divided into phases. You advance from one phase to the next. Phase One  This phase starts while you are still in the hospital. You may start by walking in your room and then in the hall. You may start some simple exercises with a therapist. Phase Two  This phase begins when you go home or to another facility. This phase may last  8-12 weeks. You will travel to a cardiac rehabilitation center or another place where rehabilitation is offered. You will slowly increase your activity level while being closely watched by a nurse or therapist. Exercises may include a combination of strength or resistance training and "cardio" or aerobic movement on a treadmill or other machines. Your condition will determine how often and how long these sessions last. In phase two, you may learn how to cook healthy meals, control your blood sugar, and manage your medicines. You may need help with scheduling or planning how and when to take your medicines. If you have questions about your medicines, it is very important that you talk to your health care provider. Phase Three This phase continues for the rest of your life. There will be less supervision. You may still participate in cardiac rehabilitation activities or become part of a group in your community. You may benefit from talking about your experience with other people who are facing similar challenges. Get help right away if:  You have severe chest discomfort, especially if the pain is crushing or pressure-like and spreads to your arms, back, neck, or jaw. Do not wait to see if the pain will go away.  You have weakness or numbness in your face, arms, or legs, especially on one side of the body.  Your speech is slurred.  You are confused.  You have a sudden severe headache or loss of vision.  You have shortness of breath.  You are sweating and have nausea.  You feel dizzy or faint.  You are fatigued. These symptoms may represent a serious problem that is an emergency. Do not wait to see if the symptoms will go away. Get medical help right away. Call your local emergency services (911 in the [place] Do not drive yourself to the hospital. This information is not intended to replace advice given to you by your health care provider. Make sure you discuss any questions you have with your  health care provider. Document Released: 12/01/2007 Document Revised: 11/14/2017 Document Reviewed: 01/05/2015 Elsevier Interactive Patient Education  2019 [employer].

## 2018-04-21 ENCOUNTER — Other Ambulatory Visit: Payer: Self-pay | Admitting: Cardiology

## 2018-04-23 ENCOUNTER — Ambulatory Visit: Payer: 59

## 2018-04-23 NOTE — Telephone Encounter (Signed)
Called patient to see if he was interested in participating in the Cardiac Rehab Program. Patient stated yes. Patient will come in for orientation on 05/17/2018 @ 8:30am and will attend the 8:15am exercise class.  Mailed homework package. Tedra Sen[place]. Support Rep II

## 2018-04-24 ENCOUNTER — Telehealth (HOSPITAL_COMMUNITY): Payer: Self-pay

## 2018-04-25 ENCOUNTER — Telehealth (HOSPITAL_COMMUNITY): Payer: Self-pay

## 2018-04-27 ENCOUNTER — Telehealth (HOSPITAL_COMMUNITY): Payer: Self-pay

## 2018-05-14 ENCOUNTER — Telehealth (HOSPITAL_COMMUNITY): Payer: Self-pay | Admitting: Pharmacist

## 2018-05-15 ENCOUNTER — Ambulatory Visit (INDEPENDENT_AMBULATORY_CARE_PROVIDER_SITE_OTHER): Payer: 59 | Admitting: Pharmacist Clinician (PhC)/ Clinical Pharmacy Specialist

## 2018-05-15 DIAGNOSIS — E7849 Other hyperlipidemia: Secondary | ICD-10-CM | POA: Diagnosis not present

## 2018-05-15 NOTE — Patient Instructions (Signed)
We will start paperwork to get Repatha approved by your insurance company.    Call if you have any questions - Kristin/Raquel at [phone]  To get your $5 Repatha copay card; 1. [email or web address] 2. Paying for Repatha (white bar across top).  Scroll down to "options for insurance situations" and click on a. "I have commercial or private insurance" - scroll down and click on the blue highlighted  b. "click here" to learn more about the [place] c. Do you have a prescription - Click YES then scroll down and mark the box "Repatha Copay Card", then continue to scroll down and enter the personal information. d. There are two blue boxes with "I agree" next to them. The first is optional if you want to enroll in their patient support program.  This one is voluntary.  The second is their patient authorization, and you must mark this one to continue.   Lastly they ask if they can contact you regarding information about market research about the drug or disease state.  You can mark either box. e. Click "next"

## 2018-05-15 NOTE — Progress Notes (Signed)
05/16/2018 Tim Strickland 1965/06/24 [id number]   HPI:  Tim Strickland is a 53 y.o. male patient of Dr Bettina Gavia, who presents today for a lipid clinic evaluation.  In addition to hyperlipidemia, his medical history is significant for CAD (s/p CABG x 5 - Jan 2020), unstable angina, hypertension, and asthma.   He is doing well post surgery and looking forward to starting cardiac rehab in the next week.    Current Medications: ezetimibe 10 mg qd  Cholesterol Goals: LDL < 70   Intolerant/previously tried:  Pravastatin, pitavastatin, rosuvastatin all caused myalgias,   Family history:   Father - first MI at 40, 2-3 since; now 52; hypertension, stroke last month  Mother - A fib, stent placed in late 33's., died at 51  2 sisters - 72 with elevated cholesterol and DM1 since her early 20's  Diet: since discharge has been working on heart healthy diet; trying to avoid eating out, has given up fast food/sandwich shops and eating more home cooked, low sodium foods.   Exercise:  Works [employer], unable to do any physical work currently, will start cardiac rehab  Labs:   01/2018:  TC 239, TG 87, HDL 68, LDL 154  Chart record going back show LDL cholesterol as high as 192 in 2010.    Current Outpatient Medications  Medication Sig Dispense Refill  . albuterol (PROVENTIL HFA;VENTOLIN HFA) 108 (90 Base) MCG/ACT inhaler Inhale 2 puffs into the lungs every 6 (six) hours as needed for wheezing or shortness of breath. 1 Inhaler 3  . aspirin EC 81 MG tablet Take 1 tablet (81 mg total) by mouth daily. 90 tablet 3  . ezetimibe (ZETIA) 10 MG tablet Take 1 tablet (10 mg total) by mouth daily. 30 tablet 3  . metoprolol tartrate (LOPRESSOR) 25 MG tablet Take 1 tablet (25 mg total) by mouth 2 (two) times daily. 60 tablet 3  . montelukast (SINGULAIR) 10 MG tablet TAKE 1 TABLET (10 MG TOTAL) BY MOUTH AT BEDTIME. MUST KEEP JAN APP FOR MORE (Patient taking differently: Take 10 mg by mouth at bedtime as needed  (allergies). ) 90 tablet 1  . nitroGLYCERIN (NITROSTAT) 0.4 MG SL tablet Place 1 tablet (0.4 mg total) under the tongue every 5 (five) minutes as needed for chest pain. 25 tablet 11  . Evolocumab (REPATHA SURECLICK) 474 MG/ML SOAJ Inject 140 mg into the skin every 14 (fourteen) days. (Patient not taking: Reported on 05/15/2018)     No current facility-administered medications for this visit.     Allergies  Allergen Reactions  . Pravastatin Other (See Comments)    04/03/2009: AST 51 and ALT 101 on pravastatin 20 mg daily. LDL at that time was 192.6.    Past Medical History:  Diagnosis Date  . Coronary artery disease   . Elevated liver enzymes    PMH of  on Pravachol  . Environmental asthma   . Hyperglycemia    A1c 5.2% in 2010  . Hyperlipidemia   . Hypertension     There were no vitals taken for this visit.   Familial hyperlipidemia Patient with familial hyperlipidemia, has seen LDL levels respond well to statin drugs but unfortunately cause myalgias.  LDL has ranged from 60's to 192 per chart lab reports.  Currently doing well with ezetimibe, but this will not get him to a goal of consistently < 70.  Will start paperwork to get coverage for Repatha.  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  He is aware of the benefits of cardiac rehab and dietary restrictions to further benefit his health.  Will repeat labs after 5-6 doses.     Tommy Medal PharmD CPP [facility] [address] [place], [place] [address]

## 2018-05-16 NOTE — Progress Notes (Signed)
LAURENT CARGILE 53 y.o. male DOB [birth year] MRN [id number]       Nutrition Screen Note  No diagnosis found. Past Medical History:  Diagnosis Date  . Coronary artery disease   . Elevated liver enzymes    PMH of  on Pravachol  . Environmental asthma   . Hyperglycemia    A1c 5.2% in 2010  . Hyperlipidemia   . Hypertension    Meds reviewed.     Current Outpatient Medications (Cardiovascular):  Marland Kitchen  Evolocumab (REPATHA SURECLICK) 364 MG/ML SOAJ, Inject 140 mg into the skin every 14 (fourteen) days. (Patient not taking: Reported on 05/15/2018) .  ezetimibe (ZETIA) 10 MG tablet, Take 1 tablet (10 mg total) by mouth daily. .  metoprolol tartrate (LOPRESSOR) 25 MG tablet, Take 1 tablet (25 mg total) by mouth 2 (two) times daily. .  nitroGLYCERIN (NITROSTAT) 0.4 MG SL tablet, Place 1 tablet (0.4 mg total) under the tongue every 5 (five) minutes as needed for chest pain.  Current Outpatient Medications (Respiratory):  .  albuterol (PROVENTIL HFA;VENTOLIN HFA) 108 (90 Base) MCG/ACT inhaler, Inhale 2 puffs into the lungs every 6 (six) hours as needed for wheezing or shortness of breath. .  montelukast (SINGULAIR) 10 MG tablet, TAKE 1 TABLET (10 MG TOTAL) BY MOUTH AT BEDTIME. MUST KEEP JAN APP FOR MORE (Patient taking differently: Take 10 mg by mouth at bedtime as needed (allergies). )  Current Outpatient Medications (Analgesics):  .  aspirin EC 81 MG tablet, Take 1 tablet (81 mg total) by mouth daily.   HT: Ht Readings from Last 1 Encounters:  04/19/18 6\' 1"  (1.854 m)    WT: Wt Readings from Last 5 Encounters:  04/19/18 175 lb 1.9 oz (79.4 kg)  04/16/18 176 lb (79.8 kg)  03/23/18 171 lb 11.8 oz (77.9 kg)  03/15/18 182 lb 12.8 oz (82.9 kg)  01/25/18 177 lb 12.8 oz (80.6 kg)     BMI = 23.1   Current tobacco use? No       Labs:  Lipid Panel     Component Value Date/Time   CHOL 239 (H) 01/25/2018 0926   CHOL 178 01/15/2014 0801   TRIG 87 01/25/2018 0926   TRIG 233 (H) 01/15/2014  0801   HDL 68 01/25/2018 0926   HDL 62 01/15/2014 0801   CHOLHDL 3.5 01/25/2018 0926   CHOLHDL 4 11/02/2016 0829   VLDL 13.8 11/02/2016 0829   LDLCALC 154 (H) 01/25/2018 0926   LDLCALC 69 01/15/2014 0801   LDLDIRECT 205.1 12/26/2012 0938    Lab Results  Component Value Date   HGBA1C 5.5 03/18/2018   CBG (last 3)  No results for input(s): GLUCAP in the last 72 hours.  Nutrition Diagnosis ? Food-and nutrition-related knowledge deficit related to lack of exposure to information as related to diagnosis of: ? CVD  Nutrition Goal(s):  ? To be determined  Plan:  Pt to attend nutrition classes ? Nutrition I ? Nutrition II ? Portion Distortion  Will provide client-centered nutrition education as part of interdisciplinary care.   Monitor and evaluate progress toward nutrition goal with team.  Laurina Bustle, MS, RD, LDN 05/16/2018 11:08 AM

## 2018-05-16 NOTE — Assessment & Plan Note (Signed)
Patient with familial hyperlipidemia, has seen LDL levels respond well to statin drugs but unfortunately cause myalgias.  LDL has ranged from 60's to 192 per chart lab reports.  Currently doing well with ezetimibe, but this will not get him to a goal of consistently < 70.  Will start paperwork to get coverage for Repatha.  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  He is aware of the benefits of cardiac rehab and dietary restrictions to further benefit his health.  Will repeat labs after 5-6 doses.

## 2018-05-17 ENCOUNTER — Encounter (HOSPITAL_COMMUNITY)
Admission: RE | Admit: 2018-05-17 | Discharge: 2018-05-17 | Disposition: A | Discharge status: Home / Self Care | Payer: 59 | Source: Ambulatory Visit | Attending: Cardiology | Admitting: Cardiology

## 2018-05-17 ENCOUNTER — Other Ambulatory Visit: Payer: Self-pay

## 2018-05-17 ENCOUNTER — Encounter (HOSPITAL_COMMUNITY): Payer: Self-pay

## 2018-05-17 VITALS — BP 102/68 | HR 77 | Ht 72.5 in | Wt 179.9 lb

## 2018-05-17 DIAGNOSIS — Z951 Presence of aortocoronary bypass graft: Secondary | ICD-10-CM | POA: Insufficient documentation

## 2018-05-17 NOTE — Progress Notes (Signed)
Cardiac Rehab Medication Review by a RN  Does the patient  feel that his/her medications are working for him/her?  yes  Has the patient been experiencing any side effects to the medications prescribed?  Yes Tim Strickland was previously on statins and had muscle aches.   Does the patient measure his/her own blood pressure or blood glucose at home?  no   Does the patient have any problems obtaining medications due to transportation or finances?   no  Understanding of regimen: good Understanding of indications: good Potential of compliance: good    RN comments: Pt awaiting PA for Repatha.    [name] 05/17/2018 11:35 AM

## 2018-05-17 NOTE — Progress Notes (Signed)
Cardiac Individual Treatment Plan  Patient Details  Name: Tim Strickland MRN: [id number] Date of Birth: March 05, 1966 Referring Provider:     CARDIAC REHAB [place] from 05/17/2018 in [place]  Referring Provider  Marijo File MD      Initial Encounter Date:    [place] from 05/17/2018 in [place]  Date  05/17/18      Visit Diagnosis: S/P CABG x 5  Patient's Home Medications on Admission:  Current Outpatient Medications:  .  albuterol (PROVENTIL HFA;VENTOLIN HFA) 108 (90 Base) MCG/ACT inhaler, Inhale 2 puffs into the lungs every 6 (six) hours as needed for wheezing or shortness of breath., Disp: 1 Inhaler, Rfl: 3 .  aspirin EC 81 MG tablet, Take 1 tablet (81 mg total) by mouth daily., Disp: 90 tablet, Rfl: 3 .  Evolocumab (REPATHA SURECLICK) 782 MG/ML SOAJ, Inject 140 mg into the skin every 14 (fourteen) days. (Patient not taking: Reported on 05/15/2018), Disp: , Rfl:  .  ezetimibe (ZETIA) 10 MG tablet, Take 1 tablet (10 mg total) by mouth daily., Disp: 30 tablet, Rfl: 3 .  metoprolol tartrate (LOPRESSOR) 25 MG tablet, Take 1 tablet (25 mg total) by mouth 2 (two) times daily., Disp: 60 tablet, Rfl: 3 .  montelukast (SINGULAIR) 10 MG tablet, TAKE 1 TABLET (10 MG TOTAL) BY MOUTH AT BEDTIME. MUST KEEP JAN APP FOR MORE (Patient taking differently: Take 10 mg by mouth at bedtime as needed (allergies). ), Disp: 90 tablet, Rfl: 1 .  nitroGLYCERIN (NITROSTAT) 0.4 MG SL tablet, Place 1 tablet (0.4 mg total) under the tongue every 5 (five) minutes as needed for chest pain., Disp: 25 tablet, Rfl: 11  Past Medical History: Past Medical History:  Diagnosis Date  . Coronary artery disease   . Elevated liver enzymes    PMH of  on Pravachol  . Environmental asthma   . Hyperglycemia    A1c 5.2% in 2010  . Hyperlipidemia   . Hypertension     Tobacco Use: Social History   Tobacco Use   Smoking Status Never Smoker  Smokeless Tobacco Never Used    Labs: Recent Review Flowsheet Data    Labs for ITP Cardiac and Pulmonary Rehab Latest Ref Rng & Units 03/19/2018 03/19/2018 03/19/2018 03/19/2018 03/20/2018   Cholestrol 100 - 199 mg/dL - - - - -   LDLCALC 0 - 99 mg/dL - - - - -   LDLDIRECT mg/dL - - - - -   HDL >39 mg/dL - - - - -   Trlycerides 0 - 149 mg/dL - - - - -   Hemoglobin A1c 4.8 - 5.6 % - - - - -   PHART 7.350 - 7.450 7.347(L) 7.344(L) 7.332(L) - -   PCO2ART 32.0 - 48.0 mmHg 37.4 41.8 39.3 - -   HCO3 20.0 - 28.0 mmol/L 20.7 22.7 20.7 - -   TCO2 22 - 32 mmol/L 22 24 22 23 24    ACIDBASEDEF 0.0 - 2.0 mmol/L 5.0(H) 3.0(H) 5.0(H) - -   O2SAT % 100.0 99.0 99.0 - -      Capillary Blood Glucose: Lab Results  Component Value Date   GLUCAP 108 (H) 03/21/2018   GLUCAP 126 (H) 03/21/2018   GLUCAP 121 (H) 03/20/2018   GLUCAP 94 03/20/2018   GLUCAP 136 (H) 03/20/2018     Exercise Target Goals: Exercise Program Goal: Individual exercise prescription set using results from initial 6 min walk  test and THRR while considering  patient's activity barriers and safety.   Exercise Prescription Goal: Initial exercise prescription builds to 30-45 minutes a day of aerobic activity, 2-3 days per week.  Home exercise guidelines will be given to patient during program as part of exercise prescription that the participant will acknowledge.  Activity Barriers & Risk Stratification: Activity Barriers & Cardiac Risk Stratification - 05/17/18 1027      Activity Barriers & Cardiac Risk Stratification   Activity Barriers  None    Cardiac Risk Stratification  High       6 Minute Walk: 6 Minute Walk    Row Name 05/17/18 1026         6 Minute Walk   Phase  Initial     Distance  1874 feet     Walk Time  6 minutes     # of Rest Breaks  0     MPH  3.5     METS  5     RPE  11     Perceived Dyspnea   0     VO2 Peak  17.58     Symptoms  No     Resting HR  77 bpm     Resting BP   102/68     Resting Oxygen Saturation   99 %     Exercise Oxygen Saturation  during 6 min walk  99 %     Max Ex. HR  89 bpm     Max Ex. BP  120/82     2 Minute Post BP  120/70        Oxygen Initial Assessment:   Oxygen Re-Evaluation:   Oxygen Discharge (Final Oxygen Re-Evaluation):   Initial Exercise Prescription: Initial Exercise Prescription - 05/17/18 1000      Date of Initial Exercise RX and Referring Provider   Date  05/17/18    Referring Provider  Marijo File MD    Expected Discharge Date  08/22/18      Treadmill   MPH  3.8    Grade  2    Minutes  10      Bike   Level  1.5    Minutes  10    METs  4.54      NuStep   Level  3    SPM  85    Minutes  10    METs  5      Prescription Details   Frequency (times per week)  3    Duration  Progress to 30 minutes of continuous aerobic without signs/symptoms of physical distress      Intensity   THRR 40-80% of Max Heartrate  67-134    Ratings of Perceived Exertion  11-13      Progression   Progression  Continue to progress workloads to maintain intensity without signs/symptoms of physical distress.      Resistance Training   Training Prescription  Yes    Weight  5 lbs.     Reps  10-15       Perform Capillary Blood Glucose checks as needed.  Exercise Prescription Changes:   Exercise Comments:   Exercise Goals and Review: Exercise Goals    Row Name 05/17/18 1027             Exercise Goals   Increase Physical Activity  Yes       Intervention  Provide advice, education, support and counseling about physical activity/exercise needs.;Develop an individualized exercise prescription for  aerobic and resistive training based on initial evaluation findings, risk stratification, comorbidities and participant's personal goals.       Expected Outcomes  Short Term: Attend rehab on a regular basis to increase amount of physical activity.       Increase Strength and Stamina  Yes       Intervention  Provide  advice, education, support and counseling about physical activity/exercise needs.;Develop an individualized exercise prescription for aerobic and resistive training based on initial evaluation findings, risk stratification, comorbidities and participant's personal goals.       Expected Outcomes  Short Term: Increase workloads from initial exercise prescription for resistance, speed, and METs.       Able to understand and use rate of perceived exertion (RPE) scale  Yes       Intervention  Provide education and explanation on how to use RPE scale       Expected Outcomes  Short Term: Able to use RPE daily in rehab to express subjective intensity level;Long Term:  Able to use RPE to guide intensity level when exercising independently       Knowledge and understanding of Target Heart Rate Range (THRR)  Yes       Intervention  Provide education and explanation of THRR including how the numbers were predicted and where they are located for reference       Expected Outcomes  Short Term: Able to state/look up THRR;Long Term: Able to use THRR to govern intensity when exercising independently;Short Term: Able to use daily as guideline for intensity in rehab       Able to check pulse independently  Yes       Intervention  Provide education and demonstration on how to check pulse in carotid and radial arteries.;Review the importance of being able to check your own pulse for safety during independent exercise       Expected Outcomes  Short Term: Able to explain why pulse checking is important during independent exercise;Long Term: Able to check pulse independently and accurately       Understanding of Exercise Prescription  Yes       Intervention  Provide education, explanation, and written materials on patient's individual exercise prescription       Expected Outcomes  Short Term: Able to explain program exercise prescription;Long Term: Able to explain home exercise prescription to exercise independently           Exercise Goals Re-Evaluation :   Discharge Exercise Prescription (Final Exercise Prescription Changes):   Nutrition:  Target Goals: Understanding of nutrition guidelines, daily intake of sodium 1500mg , cholesterol 200mg , calories 30% from fat and 7% or less from saturated fats, daily to have 5 or more servings of fruits and vegetables.  Biometrics: Pre Biometrics - 05/17/18 1027      Pre Biometrics   Height  6' 0.5" (1.842 m)    Weight  81.6 kg    Waist Circumference  35 inches    Hip Circumference  37.5 inches    Waist to Hip Ratio  0.93 %    BMI (Calculated)  24.05    Triceps Skinfold  24 mm    % Body Fat  24.9 %    Grip Strength  45 kg    Flexibility  13 in    Single Leg Stand  30 seconds        Nutrition Therapy Plan and Nutrition Goals: Nutrition Therapy & Goals - 05/17/18 1117      Nutrition Therapy  Diet  heart healthy      Personal Nutrition Goals   Nutrition Goal  to be determined       Nutrition Assessments: Nutrition Assessments - 05/17/18 1117      MEDFICTS Scores   Pre Score  56       Nutrition Goals Re-Evaluation:   Nutrition Goals Re-Evaluation:   Nutrition Goals Discharge (Final Nutrition Goals Re-Evaluation):   Psychosocial: Target Goals: Acknowledge presence or absence of significant depression and/or stress, maximize coping skills, provide positive support system. Participant is able to verbalize types and ability to use techniques and skills needed for reducing stress and depression.  Initial Review & Psychosocial Screening: Initial Psych Review & Screening - 05/17/18 1109      Initial Review   Current issues with  Current Depression      Family Dynamics   Good Support System?  Yes   Pt lists his spouse and friends as sources of support.      Barriers   Psychosocial barriers to participate in program  The patient should benefit from training in stress management and relaxation.      Screening Interventions    Interventions  Encouraged to exercise;Provide feedback about the scores to participant    Expected Outcomes  Short Term goal: Identification and review with participant of any Quality of Life or Depression concerns found by scoring the questionnaire.;Long Term goal: The participant improves quality of Life and PHQ9 Scores as seen by post scores and/or verbalization of changes       Quality of Life Scores: Quality of Life - 05/17/18 1028      Quality of Life   Select  Quality of Life      Quality of Life Scores   Health/Function Pre  21.3 %    Socioeconomic Pre  15.38 %    Psych/Spiritual Pre  14.43 %    Family Pre  23.7 %    GLOBAL Pre  18.91 %      Scores of 19 and below usually indicate a poorer quality of life in these areas.  A difference of  2-3 points is a clinically meaningful difference.  A difference of 2-3 points in the total score of the Quality of Life Index has been associated with significant improvement in overall quality of life, self-image, physical symptoms, and general health in studies assessing change in quality of life.  PHQ-9: Recent Review Flowsheet Data    Depression screen [facility] 2/9 04/16/2018 11/04/2016 12/26/2012   Decreased Interest 0 0 0   Down, Depressed, Hopeless 0 0 0   PHQ - 2 Score 0 0 0     Interpretation of Total Score  Total Score Depression Severity:  1-4 = Minimal depression, 5-9 = Mild depression, 10-14 = Moderate depression, 15-19 = Moderately severe depression, 20-27 = Severe depression   Psychosocial Evaluation and Intervention:   Psychosocial Re-Evaluation:   Psychosocial Discharge (Final Psychosocial Re-Evaluation):   Vocational Rehabilitation: Provide vocational rehab assistance to qualifying candidates.   Vocational Rehab Evaluation & Intervention: Vocational Rehab - 05/17/18 1114      Initial Vocational Rehab Evaluation & Intervention   Assessment shows need for Vocational Rehabilitation  Yes       Education: Education  Goals: Education classes will be provided on a weekly basis, covering required topics. Participant will state understanding/return demonstration of topics presented.  Learning Barriers/Preferences: Learning Barriers/Preferences - 05/17/18 1029      Learning Barriers/Preferences   Learning Barriers  Sight;Hearing   Ringing  in ears, Reading glasses.    Learning Preferences  None       Education Topics: Count Your Pulse:  -Group instruction provided by verbal instruction, demonstration, patient participation and written materials to support subject.  Instructors address importance of being able to find your pulse and how to count your pulse when at home without a heart monitor.  Patients get hands on experience counting their pulse with staff help and individually.   Heart Attack, Angina, and Risk Factor Modification:  -Group instruction provided by verbal instruction, video, and written materials to support subject.  Instructors address signs and symptoms of angina and heart attacks.    Also discuss risk factors for heart disease and how to make changes to improve heart health risk factors.   Functional Fitness:  -Group instruction provided by verbal instruction, demonstration, patient participation, and written materials to support subject.  Instructors address safety measures for doing things around the house.  Discuss how to get up and down off the floor, how to pick things up properly, how to safely get out of a chair without assistance, and balance training.   Meditation and Mindfulness:  -Group instruction provided by verbal instruction, patient participation, and written materials to support subject.  Instructor addresses importance of mindfulness and meditation practice to help reduce stress and improve awareness.  Instructor also leads participants through a meditation exercise.    Stretching for Flexibility and Mobility:  -Group instruction provided by verbal instruction,  patient participation, and written materials to support subject.  Instructors lead participants through series of stretches that are designed to increase flexibility thus improving mobility.  These stretches are additional exercise for major muscle groups that are typically performed during regular warm up and cool down.   Hands Only CPR:  -Group verbal, video, and participation provides a basic overview of AHA guidelines for community CPR. Role-play of emergencies allow participants the opportunity to practice calling for help and chest compression technique with discussion of AED use.   Hypertension: -Group verbal and written instruction that provides a basic overview of hypertension including the most recent diagnostic guidelines, risk factor reduction with self-care instructions and medication management.    Nutrition I class: Heart Healthy Eating:  -Group instruction provided by PowerPoint slides, verbal discussion, and written materials to support subject matter. The instructor gives an explanation and review of the Therapeutic Lifestyle Changes diet recommendations, which includes a discussion on lipid goals, dietary fat, sodium, fiber, plant stanol/sterol esters, sugar, and the components of a well-balanced, healthy diet.   Nutrition II class: Lifestyle Skills:  -Group instruction provided by PowerPoint slides, verbal discussion, and written materials to support subject matter. The instructor gives an explanation and review of label reading, grocery shopping for heart health, heart healthy recipe modifications, and ways to make healthier choices when eating out.   Diabetes Question & Answer:  -Group instruction provided by PowerPoint slides, verbal discussion, and written materials to support subject matter. The instructor gives an explanation and review of diabetes co-morbidities, pre- and post-prandial blood glucose goals, pre-exercise blood glucose goals, signs, symptoms, and  treatment of hypoglycemia and hyperglycemia, and foot care basics.   Diabetes Blitz:  -Group instruction provided by PowerPoint slides, verbal discussion, and written materials to support subject matter. The instructor gives an explanation and review of the physiology behind type 1 and type 2 diabetes, diabetes medications and rational behind using different medications, pre- and post-prandial blood glucose recommendations and Hemoglobin A1c goals, diabetes diet, and exercise including blood  glucose guidelines for exercising safely.    Portion Distortion:  -Group instruction provided by PowerPoint slides, verbal discussion, written materials, and food models to support subject matter. The instructor gives an explanation of serving size versus portion size, changes in portions sizes over the last 20 years, and what consists of a serving from each food group.   Stress Management:  -Group instruction provided by verbal instruction, video, and written materials to support subject matter.  Instructors review role of stress in heart disease and how to cope with stress positively.     Exercising on Your Own:  -Group instruction provided by verbal instruction, power point, and written materials to support subject.  Instructors discuss benefits of exercise, components of exercise, frequency and intensity of exercise, and end points for exercise.  Also discuss use of nitroglycerin and activating EMS.  Review options of places to exercise outside of rehab.  Review guidelines for sex with heart disease.   Cardiac Drugs I:  -Group instruction provided by verbal instruction and written materials to support subject.  Instructor reviews cardiac drug classes: antiplatelets, anticoagulants, beta blockers, and statins.  Instructor discusses reasons, side effects, and lifestyle considerations for each drug class.   Cardiac Drugs II:  -Group instruction provided by verbal instruction and written materials to  support subject.  Instructor reviews cardiac drug classes: angiotensin converting enzyme inhibitors (ACE-I), angiotensin II receptor blockers (ARBs), nitrates, and calcium channel blockers.  Instructor discusses reasons, side effects, and lifestyle considerations for each drug class.   Anatomy and Physiology of the Circulatory System:  Group verbal and written instruction and models provide basic cardiac anatomy and physiology, with the coronary electrical and arterial systems. Review of: AMI, Angina, Valve disease, Heart Failure, Peripheral Artery Disease, Cardiac Arrhythmia, Pacemakers, and the ICD.   Other Education:  -Group or individual verbal, written, or video instructions that support the educational goals of the cardiac rehab program.   Holiday Eating Survival Tips:  -Group instruction provided by PowerPoint slides, verbal discussion, and written materials to support subject matter. The instructor gives patients tips, tricks, and techniques to help them not only survive but enjoy the holidays despite the onslaught of food that accompanies the holidays.   Knowledge Questionnaire Score: Knowledge Questionnaire Score - 05/17/18 1029      Knowledge Questionnaire Score   Pre Score  22/24       Core Components/Risk Factors/Patient Goals at Admission: Personal Goals and Risk Factors at Admission - 05/17/18 1029      Core Components/Risk Factors/Patient Goals on Admission    Weight Management  Yes;Weight Maintenance    Intervention  Weight Management: Develop a combined nutrition and exercise program designed to reach desired caloric intake, while maintaining appropriate intake of nutrient and fiber, sodium and fats, and appropriate energy expenditure required for the weight goal.;Weight Management: Provide education and appropriate resources to help participant work on and attain dietary goals.    Admit Weight  179 lb 14.3 oz (81.6 kg)    Expected Outcomes  Short Term: Continue to  assess and modify interventions until short term weight is achieved;Long Term: Adherence to nutrition and physical activity/exercise program aimed toward attainment of established weight goal;Weight Maintenance: Understanding of the daily nutrition guidelines, which includes 25-35% calories from fat, 7% or less cal from saturated fats, less than 200mg  cholesterol, less than 1.5gm of sodium, & 5 or more servings of fruits and vegetables daily;Understanding recommendations for meals to include 15-35% energy as protein, 25-35% energy from fat, 35-60% energy  from carbohydrates, less than 200mg  of dietary cholesterol, 20-35 gm of total fiber daily;Understanding of distribution of calorie intake throughout the day with the consumption of 4-5 meals/snacks    Hypertension  Yes    Intervention  Provide education on lifestyle modifcations including regular physical activity/exercise, weight management, moderate sodium restriction and increased consumption of fresh fruit, vegetables, and low fat dairy, alcohol moderation, and smoking cessation.;Monitor prescription use compliance.    Expected Outcomes  Short Term: Continued assessment and intervention until BP is < 140/73mm HG in hypertensive participants. < 130/53mm HG in hypertensive participants with diabetes, heart failure or chronic kidney disease.;Long Term: Maintenance of blood pressure at goal levels.    Lipids  Yes    Intervention  Provide education and support for participant on nutrition & aerobic/resistive exercise along with prescribed medications to achieve LDL 70mg , HDL >40mg .    Expected Outcomes  Short Term: Participant states understanding of desired cholesterol values and is compliant with medications prescribed. Participant is following exercise prescription and nutrition guidelines.;Long Term: Cholesterol controlled with medications as prescribed, with individualized exercise RX and with personalized nutrition plan. Value goals: LDL < 70mg , HDL > 40  mg.    Stress  Yes    Intervention  Offer individual and/or small group education and counseling on adjustment to heart disease, stress management and health-related lifestyle change. Teach and support self-help strategies.;Refer participants experiencing significant psychosocial distress to appropriate mental health specialists for further evaluation and treatment. When possible, include family members and significant others in education/counseling sessions.    Expected Outcomes  Short Term: Participant demonstrates changes in health-related behavior, relaxation and other stress management skills, ability to obtain effective social support, and compliance with psychotropic medications if prescribed.;Long Term: Emotional wellbeing is indicated by absence of clinically significant psychosocial distress or social isolation.       Core Components/Risk Factors/Patient Goals Review:    Core Components/Risk Factors/Patient Goals at Discharge (Final Review):    ITP Comments: ITP Comments    Row Name 05/17/18 1031           ITP Comments  Dr. Fransico Him, Medical Director          Comments: Patient attended orientation from 0818 to [phone] to review rules and guidelines for program. Completed 6 minute walk test, Intitial ITP, and exercise prescription.  VSS. Telemetry-SR.  Asymptomatic.

## 2018-05-21 ENCOUNTER — Telehealth (HOSPITAL_COMMUNITY): Payer: Self-pay | Admitting: *Deleted

## 2018-05-21 NOTE — Progress Notes (Signed)
Tim Strickland 53 y.o. male DOB: 04-20-1965 MRN: [id number]      Nutrition Note  1. S/P CABG x 5    Past Medical History:  Diagnosis Date  . Coronary artery disease   . Elevated liver enzymes    PMH of  on Pravachol  . Environmental asthma   . Hyperglycemia    A1c 5.2% in 2010  . Hyperlipidemia   . Hypertension    Meds reviewed.    Current Outpatient Medications (Cardiovascular):  .  ezetimibe (ZETIA) 10 MG tablet, Take 1 tablet (10 mg total) by mouth daily. .  metoprolol tartrate (LOPRESSOR) 25 MG tablet, Take 1 tablet (25 mg total) by mouth 2 (two) times daily. .  nitroGLYCERIN (NITROSTAT) 0.4 MG SL tablet, Place 1 tablet (0.4 mg total) under the tongue every 5 (five) minutes as needed for chest pain. .  Evolocumab (REPATHA SURECLICK) 585 MG/ML SOAJ, Inject 140 mg into the skin every 14 (fourteen) days. (Patient not taking: Reported on 05/15/2018)  Current Outpatient Medications (Respiratory):  .  albuterol (PROVENTIL HFA;VENTOLIN HFA) 108 (90 Base) MCG/ACT inhaler, Inhale 2 puffs into the lungs every 6 (six) hours as needed for wheezing or shortness of breath. .  montelukast (SINGULAIR) 10 MG tablet, TAKE 1 TABLET (10 MG TOTAL) BY MOUTH AT BEDTIME. MUST KEEP JAN APP FOR MORE (Patient taking differently: Take 10 mg by mouth at bedtime as needed (allergies). )  Current Outpatient Medications (Analgesics):  .  aspirin EC 81 MG tablet, Take 1 tablet (81 mg total) by mouth daily.    HT: Ht Readings from Last 1 Encounters:  05/17/18 6' 0.5" (1.842 m)    WT: Wt Readings from Last 5 Encounters:  05/17/18 179 lb 14.3 oz (81.6 kg)  04/19/18 175 lb 1.9 oz (79.4 kg)  04/16/18 176 lb (79.8 kg)  03/23/18 171 lb 11.8 oz (77.9 kg)  03/15/18 182 lb 12.8 oz (82.9 kg)     Body mass index is 24.06 kg/m.   Current tobacco use? No  Labs:  Lipid Panel     Component Value Date/Time   CHOL 239 (H) 01/25/2018 0926   CHOL 178 01/15/2014 0801   TRIG 87 01/25/2018 0926   TRIG 233 (H)  01/15/2014 0801   HDL 68 01/25/2018 0926   HDL 62 01/15/2014 0801   CHOLHDL 3.5 01/25/2018 0926   CHOLHDL 4 11/02/2016 0829   VLDL 13.8 11/02/2016 0829   LDLCALC 154 (H) 01/25/2018 0926   LDLCALC 69 01/15/2014 0801   LDLDIRECT 205.1 12/26/2012 0938    Lab Results  Component Value Date   HGBA1C 5.5 03/18/2018   CBG (last 3)  No results for input(s): GLUCAP in the last 72 hours.  Nutrition Note Spoke with pt. Nutrition plan and goals reviewed with pt. Pt is following Step 1 of the Therapeutic Lifestyle Changes diet. Pt wants to maintain weight, shared he lost ~15 lbs after surgery and would like to stay where he is currently. Heart healthy eating, and weight maintenance tips reviewed with patient today (label reading, how to build a healthy plate, portion sizes, eating frequently across the day). Pt last A1C approaching the cusp of prediabetes, 5.5, 03/18/2018 . Discussed the differences between complex and refined carbs, recommended pt replace refined carbs with complex. Reviewed the benefits swapping in complex carbs and moderating portion sizes can have on managing blood glucose with patient. Per discussion, pt does not use canned/convenience foods often. Pt does not add salt to food. Pt does not eat  out frequently. Pt expressed understanding of the information reviewed. Pt aware of nutrition education classes offered and would like to attend nutrition classes.  Nutrition Diagnosis ? Food-and nutrition-related knowledge deficit related to lack of exposure to information as related to diagnosis of: ? CVD   Nutrition Intervention ? Pt's individual nutrition plan and goals reviewed with pt. ? Pt given handouts for: ? Nutrition I class ? Nutrition II class  Nutrition Goal(s):  ? Pt to identify and limit food sources of saturated fat, trans fat, refined carbohydrates and sodium ? Pt to build a healthy plate including vegetables, fruits, whole grains, and low-fat dairy products in a heart  healthy meal plan. ? Pt to weigh and measure serving sizes ? Pt able to name foods that affect blood glucose.  Plan:  ? Pt to attend nutrition classes:  ? Nutrition I ? Nutrition II ? Portion Distortion  ? Will provide client-centered nutrition education as part of interdisciplinary care ? Monitor and evaluate progress toward nutrition goal with team.   Laurina Bustle, MS, RD, LDN 05/21/2018 11:02 AM

## 2018-05-21 NOTE — Telephone Encounter (Signed)
Pt called and updated about closure of cardiac rehab for two weeks.  Pt was scheduled to start cardiac rehab on 05/23/2018.  This has been postponed to 06/04/2018.  Pt verbalized understanding and appreciative of call.

## 2018-05-23 ENCOUNTER — Ambulatory Visit (HOSPITAL_COMMUNITY): Payer: 59

## 2018-05-23 ENCOUNTER — Telehealth (HOSPITAL_COMMUNITY): Payer: Self-pay

## 2018-05-23 NOTE — Telephone Encounter (Signed)
Phone call made to Pt to discuss home exercise guidelines due to closure of Cardiac Rehab for [facility]. Pt did not answer. Message was left for Pt to contact Cardiac Rehab for guidelines for home exercise.

## 2018-05-25 ENCOUNTER — Ambulatory Visit (HOSPITAL_COMMUNITY): Payer: 59

## 2018-05-28 ENCOUNTER — Ambulatory Visit (HOSPITAL_COMMUNITY): Payer: 59

## 2018-05-28 ENCOUNTER — Other Ambulatory Visit: Payer: Self-pay | Admitting: *Deleted

## 2018-05-28 ENCOUNTER — Telehealth: Payer: Self-pay | Admitting: Pharmacist

## 2018-05-28 DIAGNOSIS — J4520 Mild intermittent asthma, uncomplicated: Secondary | ICD-10-CM

## 2018-05-28 MED ORDER — ALBUTEROL SULFATE HFA 108 (90 BASE) MCG/ACT IN AERS: 2.0000 | INHALATION_SPRAY | Freq: Four times a day (QID) | RESPIRATORY_TRACT | 2 refills | Status: DC | PRN

## 2018-05-28 MED ORDER — EVOLOCUMAB 140 MG/ML ~~LOC~~ SOAJ: 140.0000 mg | SUBCUTANEOUS | 11 refills | Status: DC

## 2018-05-28 NOTE — Telephone Encounter (Signed)
Pa for Repatha approved. Rx sent to prefer pharmacy  LOM; patient to download copay card and pick up medication ASAP.

## 2018-05-29 ENCOUNTER — Encounter (HOSPITAL_COMMUNITY): Payer: Self-pay | Admitting: Cardiac Rehabilitation

## 2018-05-29 DIAGNOSIS — Z951 Presence of aortocoronary bypass graft: Secondary | ICD-10-CM

## 2018-05-29 NOTE — Progress Notes (Signed)
Cardiac Individual Treatment Plan  Patient Details  Name: Tim Strickland MRN: [id number] Date of Birth: [birth year] Referring Provider:   Flowsheet Row CARDIAC REHAB PHASE II ORIENTATION from 05/17/2018 in [place]  Referring Provider  Marijo File MD      Initial Encounter Date:  [place] from 05/17/2018 in [place]  Date  05/17/18      Visit Diagnosis: S/P CABG x 5  Patient's Home Medications on Admission:  Current Outpatient Medications:  .  albuterol (PROVENTIL HFA;VENTOLIN HFA) 108 (90 Base) MCG/ACT inhaler, Inhale 2 puffs into the lungs every 6 (six) hours as needed for wheezing or shortness of breath., Disp: 1 Inhaler, Rfl: 2 .  aspirin EC 81 MG tablet, Take 1 tablet (81 mg total) by mouth daily., Disp: 90 tablet, Rfl: 3 .  Evolocumab (REPATHA SURECLICK) 267 MG/ML SOAJ, Inject 140 mg into the skin every 14 (fourteen) days., Disp: 2 pen, Rfl: 11 .  ezetimibe (ZETIA) 10 MG tablet, Take 1 tablet (10 mg total) by mouth daily., Disp: 30 tablet, Rfl: 3 .  metoprolol tartrate (LOPRESSOR) 25 MG tablet, Take 1 tablet (25 mg total) by mouth 2 (two) times daily., Disp: 60 tablet, Rfl: 3 .  montelukast (SINGULAIR) 10 MG tablet, TAKE 1 TABLET (10 MG TOTAL) BY MOUTH AT BEDTIME. MUST KEEP JAN APP FOR MORE (Patient taking differently: Take 10 mg by mouth at bedtime as needed (allergies). ), Disp: 90 tablet, Rfl: 1 .  nitroGLYCERIN (NITROSTAT) 0.4 MG SL tablet, Place 1 tablet (0.4 mg total) under the tongue every 5 (five) minutes as needed for chest pain., Disp: 25 tablet, Rfl: 11  Past Medical History: Past Medical History:  Diagnosis Date  . Coronary artery disease   . Elevated liver enzymes    PMH of  on Pravachol  . Environmental asthma   . Hyperglycemia    A1c 5.2% in 2010  . Hyperlipidemia   . Hypertension     Tobacco Use: Social History   Tobacco Use  Smoking Status  Never Smoker  Smokeless Tobacco Never Used    Labs: Recent Review Flowsheet Data    Labs for ITP Cardiac and Pulmonary Rehab Latest Ref Rng & Units 03/19/2018 03/19/2018 03/19/2018 03/19/2018 03/20/2018   Cholestrol 100 - 199 mg/dL - - - - -   LDLCALC 0 - 99 mg/dL - - - - -   LDLDIRECT mg/dL - - - - -   HDL >39 mg/dL - - - - -   Trlycerides 0 - 149 mg/dL - - - - -   Hemoglobin A1c 4.8 - 5.6 % - - - - -   PHART 7.350 - 7.450 7.347(L) 7.344(L) 7.332(L) - -   PCO2ART 32.0 - 48.0 mmHg 37.4 41.8 39.3 - -   HCO3 20.0 - 28.0 mmol/L 20.7 22.7 20.7 - -   TCO2 22 - 32 mmol/L 22 24 22 23 24    ACIDBASEDEF 0.0 - 2.0 mmol/L 5.0(H) 3.0(H) 5.0(H) - -   O2SAT % 100.0 99.0 99.0 - -      Capillary Blood Glucose: Lab Results  Component Value Date   GLUCAP 108 (H) 03/21/2018   GLUCAP 126 (H) 03/21/2018   GLUCAP 121 (H) 03/20/2018   GLUCAP 94 03/20/2018   GLUCAP 136 (H) 03/20/2018     Exercise Target Goals: Exercise Program Goal: Individual exercise prescription set using results from initial 6 min walk test and THRR while considering  patient's activity barriers and safety.   Exercise Prescription Goal: Initial exercise prescription builds to 30-45 minutes a day of aerobic activity, 2-3 days per week.  Home exercise guidelines will be given to patient during program as part of exercise prescription that the participant will acknowledge.  Activity Barriers & Risk Stratification: Activity Barriers & Cardiac Risk Stratification - 05/17/18 1027    Activity Barriers & Cardiac Risk Stratification          Activity Barriers  None    Cardiac Risk Stratification  High           6 Minute Walk: 6 Minute Walk    6 Minute Walk    Row Name 05/17/18 1026   Phase  Initial   Distance  1874 feet   Walk Time  6 minutes   # of Rest Breaks  0   MPH  3.5   METS  5   RPE  11   Perceived Dyspnea   0   VO2 Peak  17.58   Symptoms  No   Resting HR  77 bpm   Resting BP  102/68   Resting Oxygen  Saturation   99 %   Exercise Oxygen Saturation  during 6 min walk  99 %   Max Ex. HR  89 bpm   Max Ex. BP  120/82   2 Minute Post BP  120/70          Oxygen Initial Assessment:   Oxygen Re-Evaluation:   Oxygen Discharge (Final Oxygen Re-Evaluation):   Initial Exercise Prescription: Initial Exercise Prescription - 05/17/18 1000    Date of Initial Exercise RX and Referring Provider          Date  05/17/18    Referring Provider  Marijo File MD    Expected Discharge Date  08/22/18        Treadmill          MPH  3.8    Grade  2    Minutes  10        Bike          Level  1.5    Minutes  10    METs  4.54        NuStep          Level  3    SPM  85    Minutes  10    METs  5        Prescription Details          Frequency (times per week)  3    Duration  Progress to 30 minutes of continuous aerobic without signs/symptoms of physical distress        Intensity          THRR 40-80% of Max Heartrate  67-134    Ratings of Perceived Exertion  11-13        Progression          Progression  Continue to progress workloads to maintain intensity without signs/symptoms of physical distress.        Resistance Training          Training Prescription  Yes    Weight  5 lbs.     Reps  10-15           Perform Capillary Blood Glucose checks as needed.  Exercise Prescription Changes:   Exercise Comments:   Exercise Goals and Review: Exercise Goals    Exercise Goals    Row Name 05/17/18  1027   Increase Physical Activity  Yes   Intervention  Provide advice, education, support and counseling about physical activity/exercise needs.;Develop an individualized exercise prescription for aerobic and resistive training based on initial evaluation findings, risk stratification, comorbidities and participant's personal goals.   Expected Outcomes  Short Term: Attend rehab on a regular basis to increase amount of physical activity.   Increase Strength and Stamina   Yes   Intervention  Provide advice, education, support and counseling about physical activity/exercise needs.;Develop an individualized exercise prescription for aerobic and resistive training based on initial evaluation findings, risk stratification, comorbidities and participant's personal goals.   Expected Outcomes  Short Term: Increase workloads from initial exercise prescription for resistance, speed, and METs.   Able to understand and use rate of perceived exertion (RPE) scale  Yes   Intervention  Provide education and explanation on how to use RPE scale   Expected Outcomes  Short Term: Able to use RPE daily in rehab to express subjective intensity level;Long Term:  Able to use RPE to guide intensity level when exercising independently   Knowledge and understanding of Target Heart Rate Range (THRR)  Yes   Intervention  Provide education and explanation of THRR including how the numbers were predicted and where they are located for reference   Expected Outcomes  Short Term: Able to state/look up THRR;Long Term: Able to use THRR to govern intensity when exercising independently;Short Term: Able to use daily as guideline for intensity in rehab   Able to check pulse independently  Yes   Intervention  Provide education and demonstration on how to check pulse in carotid and radial arteries.;Review the importance of being able to check your own pulse for safety during independent exercise   Expected Outcomes  Short Term: Able to explain why pulse checking is important during independent exercise;Long Term: Able to check pulse independently and accurately   Understanding of Exercise Prescription  Yes   Intervention  Provide education, explanation, and written materials on patient's individual exercise prescription   Expected Outcomes  Short Term: Able to explain program exercise prescription;Long Term: Able to explain home exercise prescription to exercise independently          Exercise Goals  Re-Evaluation :   Discharge Exercise Prescription (Final Exercise Prescription Changes):   Nutrition:  Target Goals: Understanding of nutrition guidelines, daily intake of sodium 1500mg , cholesterol 200mg , calories 30% from fat and 7% or less from saturated fats, daily to have 5 or more servings of fruits and vegetables.  Biometrics: Pre Biometrics - 05/17/18 1027    Pre Biometrics          Height  6' 0.5" (1.842 m)    Weight  179 lb 14.3 oz (81.6 kg)    Waist Circumference  35 inches    Hip Circumference  37.5 inches    Waist to Hip Ratio  0.93 %    BMI (Calculated)  24.05    Triceps Skinfold  24 mm    % Body Fat  24.9 %    Grip Strength  45 kg    Flexibility  13 in    Single Leg Stand  30 seconds            Nutrition Therapy Plan and Nutrition Goals: Nutrition Therapy & Goals - 05/17/18 1117    Nutrition Therapy          Diet  heart healthy        Personal Nutrition Goals  Nutrition Goal  Pt to identify and limit food sources of saturated fat, trans fat, refined carbohydrates and sodium    Personal Goal #2  Pt to build a healthy plate including vegetables, fruits, whole grains, and low-fat dairy products in a heart healthy meal plan.    Personal Goal #3  Pt to weigh and measure serving sizes    Personal Goal #4  Pt able to name foods that affect blood glucose.        Intervention Plan          Intervention  Prescribe, educate and counsel regarding individualized specific dietary modifications aiming towards targeted core components such as weight, hypertension, lipid management, diabetes, heart failure and other comorbidities.    Expected Outcomes  Short Term Goal: Understand basic principles of dietary content, such as calories, fat, sodium, cholesterol and nutrients.;Long Term Goal: Adherence to prescribed nutrition plan.           Nutrition Assessments: Nutrition Assessments - 05/17/18 1117    MEDFICTS Scores          Pre Score  56            Nutrition Goals Re-Evaluation:   Nutrition Goals Re-Evaluation:   Nutrition Goals Discharge (Final Nutrition Goals Re-Evaluation):   Psychosocial: Target Goals: Acknowledge presence or absence of significant depression and/or stress, maximize coping skills, provide positive support system. Participant is able to verbalize types and ability to use techniques and skills needed for reducing stress and depression.  Initial Review & Psychosocial Screening: Initial Psych Review & Screening - 05/17/18 1109    Initial Review          Current issues with  Current Depression        Family Dynamics          Good Support System?  Yes   Pt lists his spouse and friends as sources of support.        Barriers          Psychosocial barriers to participate in program  The patient should benefit from training in stress management and relaxation.        Screening Interventions          Interventions  Encouraged to exercise;Provide feedback about the scores to participant    Expected Outcomes  Short Term goal: Identification and review with participant of any Quality of Life or Depression concerns found by scoring the questionnaire.;Long Term goal: The participant improves quality of Life and PHQ9 Scores as seen by post scores and/or verbalization of changes           Quality of Life Scores: Quality of Life - 05/17/18 1028    Quality of Life          Select  Quality of Life        Quality of Life Scores          Health/Function Pre  21.3 %    Socioeconomic Pre  15.38 %    Psych/Spiritual Pre  14.43 %    Family Pre  23.7 %    GLOBAL Pre  18.91 %          Scores of 19 and below usually indicate a poorer quality of life in these areas.  A difference of  2-3 points is a clinically meaningful difference.  A difference of 2-3 points in the total score of the Quality of Life Index has been associated with significant improvement in overall quality of life, self-image, physical  symptoms,  and general health in studies assessing change in quality of life.  PHQ-9: Recent Review Flowsheet Data    Depression screen [facility] 2/9 04/16/2018 11/04/2016 12/26/2012   Decreased Interest 0 0 0   Down, Depressed, Hopeless 0 0 0   PHQ - 2 Score 0 0 0     Interpretation of Total Score  Total Score Depression Severity:  1-4 = Minimal depression, 5-9 = Mild depression, 10-14 = Moderate depression, 15-19 = Moderately severe depression, 20-27 = Severe depression   Psychosocial Evaluation and Intervention:   Psychosocial Re-Evaluation:   Psychosocial Discharge (Final Psychosocial Re-Evaluation):   Vocational Rehabilitation: Provide vocational rehab assistance to qualifying candidates.   Vocational Rehab Evaluation & Intervention: Vocational Rehab - 05/17/18 1114    Initial Vocational Rehab Evaluation & Intervention          Assessment shows need for Vocational Rehabilitation  Yes           Education: Education Goals: Education classes will be provided on a weekly basis, covering required topics. Participant will state understanding/return demonstration of topics presented.  Learning Barriers/Preferences: Learning Barriers/Preferences - 05/17/18 1029    Learning Barriers/Preferences          Learning Barriers  Sight;Hearing   Ringing in ears, Reading glasses.    Learning Preferences  None           Education Topics: Count Your Pulse:  -Group instruction provided by verbal instruction, demonstration, patient participation and written materials to support subject.  Instructors address importance of being able to find your pulse and how to count your pulse when at home without a heart monitor.  Patients get hands on experience counting their pulse with staff help and individually.   Heart Attack, Angina, and Risk Factor Modification:  -Group instruction provided by verbal instruction, video, and written materials to support subject.  Instructors address signs and  symptoms of angina and heart attacks.    Also discuss risk factors for heart disease and how to make changes to improve heart health risk factors.   Functional Fitness:  -Group instruction provided by verbal instruction, demonstration, patient participation, and written materials to support subject.  Instructors address safety measures for doing things around the house.  Discuss how to get up and down off the floor, how to pick things up properly, how to safely get out of a chair without assistance, and balance training.   Meditation and Mindfulness:  -Group instruction provided by verbal instruction, patient participation, and written materials to support subject.  Instructor addresses importance of mindfulness and meditation practice to help reduce stress and improve awareness.  Instructor also leads participants through a meditation exercise.    Stretching for Flexibility and Mobility:  -Group instruction provided by verbal instruction, patient participation, and written materials to support subject.  Instructors lead participants through series of stretches that are designed to increase flexibility thus improving mobility.  These stretches are additional exercise for major muscle groups that are typically performed during regular warm up and cool down.   Hands Only CPR:  -Group verbal, video, and participation provides a basic overview of AHA guidelines for community CPR. Role-play of emergencies allow participants the opportunity to practice calling for help and chest compression technique with discussion of AED use.   Hypertension: -Group verbal and written instruction that provides a basic overview of hypertension including the most recent diagnostic guidelines, risk factor reduction with self-care instructions and medication management.    Nutrition I class: Heart Healthy Eating:  -Group  instruction provided by PowerPoint slides, verbal discussion, and written materials to support  subject matter. The instructor gives an explanation and review of the Therapeutic Lifestyle Changes diet recommendations, which includes a discussion on lipid goals, dietary fat, sodium, fiber, plant stanol/sterol esters, sugar, and the components of a well-balanced, healthy diet.   Nutrition II class: Lifestyle Skills:  -Group instruction provided by PowerPoint slides, verbal discussion, and written materials to support subject matter. The instructor gives an explanation and review of label reading, grocery shopping for heart health, heart healthy recipe modifications, and ways to make healthier choices when eating out.   Diabetes Question & Answer:  -Group instruction provided by PowerPoint slides, verbal discussion, and written materials to support subject matter. The instructor gives an explanation and review of diabetes co-morbidities, pre- and post-prandial blood glucose goals, pre-exercise blood glucose goals, signs, symptoms, and treatment of hypoglycemia and hyperglycemia, and foot care basics.   Diabetes Blitz:  -Group instruction provided by PowerPoint slides, verbal discussion, and written materials to support subject matter. The instructor gives an explanation and review of the physiology behind type 1 and type 2 diabetes, diabetes medications and rational behind using different medications, pre- and post-prandial blood glucose recommendations and Hemoglobin A1c goals, diabetes diet, and exercise including blood glucose guidelines for exercising safely.    Portion Distortion:  -Group instruction provided by PowerPoint slides, verbal discussion, written materials, and food models to support subject matter. The instructor gives an explanation of serving size versus portion size, changes in portions sizes over the last 20 years, and what consists of a serving from each food group.   Stress Management:  -Group instruction provided by verbal instruction, video, and written materials to  support subject matter.  Instructors review role of stress in heart disease and how to cope with stress positively.     Exercising on Your Own:  -Group instruction provided by verbal instruction, power point, and written materials to support subject.  Instructors discuss benefits of exercise, components of exercise, frequency and intensity of exercise, and end points for exercise.  Also discuss use of nitroglycerin and activating EMS.  Review options of places to exercise outside of rehab.  Review guidelines for sex with heart disease.   Cardiac Drugs I:  -Group instruction provided by verbal instruction and written materials to support subject.  Instructor reviews cardiac drug classes: antiplatelets, anticoagulants, beta blockers, and statins.  Instructor discusses reasons, side effects, and lifestyle considerations for each drug class.   Cardiac Drugs II:  -Group instruction provided by verbal instruction and written materials to support subject.  Instructor reviews cardiac drug classes: angiotensin converting enzyme inhibitors (ACE-I), angiotensin II receptor blockers (ARBs), nitrates, and calcium channel blockers.  Instructor discusses reasons, side effects, and lifestyle considerations for each drug class.   Anatomy and Physiology of the Circulatory System:  Group verbal and written instruction and models provide basic cardiac anatomy and physiology, with the coronary electrical and arterial systems. Review of: AMI, Angina, Valve disease, Heart Failure, Peripheral Artery Disease, Cardiac Arrhythmia, Pacemakers, and the ICD.   Other Education:  -Group or individual verbal, written, or video instructions that support the educational goals of the cardiac rehab program.   Holiday Eating Survival Tips:  -Group instruction provided by PowerPoint slides, verbal discussion, and written materials to support subject matter. The instructor gives patients tips, tricks, and techniques to help them  not only survive but enjoy the holidays despite the onslaught of food that accompanies the holidays.   Knowledge Questionnaire  Score: Knowledge Questionnaire Score - 05/17/18 1029    Knowledge Questionnaire Score          Pre Score  22/24           Core Components/Risk Factors/Patient Goals at Admission: Personal Goals and Risk Factors at Admission - 05/17/18 1029    Core Components/Risk Factors/Patient Goals on Admission           Weight Management  Yes;Weight Maintenance    Intervention  Weight Management: Develop a combined nutrition and exercise program designed to reach desired caloric intake, while maintaining appropriate intake of nutrient and fiber, sodium and fats, and appropriate energy expenditure required for the weight goal.;Weight Management: Provide education and appropriate resources to help participant work on and attain dietary goals.    Admit Weight  179 lb 14.3 oz (81.6 kg)    Expected Outcomes  Short Term: Continue to assess and modify interventions until short term weight is achieved;Long Term: Adherence to nutrition and physical activity/exercise program aimed toward attainment of established weight goal;Weight Maintenance: Understanding of the daily nutrition guidelines, which includes 25-35% calories from fat, 7% or less cal from saturated fats, less than 200mg  cholesterol, less than 1.5gm of sodium, & 5 or more servings of fruits and vegetables daily;Understanding recommendations for meals to include 15-35% energy as protein, 25-35% energy from fat, 35-60% energy from carbohydrates, less than 200mg  of dietary cholesterol, 20-35 gm of total fiber daily;Understanding of distribution of calorie intake throughout the day with the consumption of 4-5 meals/snacks    Hypertension  Yes    Intervention  Provide education on lifestyle modifcations including regular physical activity/exercise, weight management, moderate sodium restriction and increased consumption of fresh  fruit, vegetables, and low fat dairy, alcohol moderation, and smoking cessation.;Monitor prescription use compliance.    Expected Outcomes  Short Term: Continued assessment and intervention until BP is < 140/64mm HG in hypertensive participants. < 130/77mm HG in hypertensive participants with diabetes, heart failure or chronic kidney disease.;Long Term: Maintenance of blood pressure at goal levels.    Lipids  Yes    Intervention  Provide education and support for participant on nutrition & aerobic/resistive exercise along with prescribed medications to achieve LDL 70mg , HDL >40mg .    Expected Outcomes  Short Term: Participant states understanding of desired cholesterol values and is compliant with medications prescribed. Participant is following exercise prescription and nutrition guidelines.;Long Term: Cholesterol controlled with medications as prescribed, with individualized exercise RX and with personalized nutrition plan. Value goals: LDL < 70mg , HDL > 40 mg.    Stress  Yes    Intervention  Offer individual and/or small group education and counseling on adjustment to heart disease, stress management and health-related lifestyle change. Teach and support self-help strategies.;Refer participants experiencing significant psychosocial distress to appropriate mental health specialists for further evaluation and treatment. When possible, include family members and significant others in education/counseling sessions.    Expected Outcomes  Short Term: Participant demonstrates changes in health-related behavior, relaxation and other stress management skills, ability to obtain effective social support, and compliance with psychotropic medications if prescribed.;Long Term: Emotional wellbeing is indicated by absence of clinically significant psychosocial distress or social isolation.           Core Components/Risk Factors/Patient Goals Review:    Core Components/Risk Factors/Patient Goals at Discharge  (Final Review):    ITP Comments: ITP Comments    Row Name 05/17/18 1031 05/29/18 1540   ITP Comments  Dr. Fransico Him, Medical Director  30 day ITP  reveiw.  pt currently on hold for CR dept closure for COVID 19 precautions.       Comments:  Andi Hence, RN, BSN Cardiac Pulmonary Rehab

## 2018-05-30 ENCOUNTER — Ambulatory Visit (HOSPITAL_COMMUNITY): Payer: 59

## 2018-05-30 ENCOUNTER — Encounter

## 2018-05-30 ENCOUNTER — Telehealth (HOSPITAL_COMMUNITY): Payer: Self-pay

## 2018-05-30 NOTE — Telephone Encounter (Signed)
Called pt to for nutrition education and counseling. Left voicemail with contact information.

## 2018-05-31 ENCOUNTER — Telehealth (HOSPITAL_COMMUNITY): Payer: Self-pay

## 2018-05-31 NOTE — Telephone Encounter (Signed)
Phone call made to Pt in regards to Cardiac Rehab closure for an additional four weeks with an expected reopen date of 06/25/18. This is due to [place]. Pt was responsive and understands information. Pt was concerned about exercise parameters. Encouraged Pt to continue walking and monitoring HR by radial pulse check and RPE scale rating.

## 2018-06-01 ENCOUNTER — Ambulatory Visit (HOSPITAL_COMMUNITY): Payer: 59

## 2018-06-04 ENCOUNTER — Ambulatory Visit (HOSPITAL_COMMUNITY): Payer: 59

## 2018-06-05 ENCOUNTER — Other Ambulatory Visit: Payer: Self-pay | Admitting: *Deleted

## 2018-06-05 DIAGNOSIS — J4520 Mild intermittent asthma, uncomplicated: Secondary | ICD-10-CM

## 2018-06-05 MED ORDER — ALBUTEROL SULFATE HFA 108 (90 BASE) MCG/ACT IN AERS: 2.0000 | INHALATION_SPRAY | Freq: Four times a day (QID) | RESPIRATORY_TRACT | 0 refills | Status: AC | PRN

## 2018-06-06 ENCOUNTER — Ambulatory Visit (HOSPITAL_COMMUNITY): Payer: 59

## 2018-06-08 ENCOUNTER — Ambulatory Visit (HOSPITAL_COMMUNITY): Payer: 59

## 2018-06-11 ENCOUNTER — Ambulatory Visit (HOSPITAL_COMMUNITY): Payer: 59

## 2018-06-13 ENCOUNTER — Telehealth (HOSPITAL_COMMUNITY): Payer: Self-pay | Admitting: Cardiac Rehabilitation

## 2018-06-13 ENCOUNTER — Ambulatory Visit (HOSPITAL_COMMUNITY): Payer: 59

## 2018-06-13 NOTE — Telephone Encounter (Signed)
Pt phone call to inform of continued Outpatient Cardiac Rehab departmental closure for COVID 19 precautions. Future opening date to be determined. Left message on answering machine.  Saunders [place], RN, BSN Cardiac Pulmonary Rehab 

## 2018-06-13 NOTE — Telephone Encounter (Signed)
Pt phone call to inform of continued Outpatient Cardiac Rehab departmental closure for COVID 19 precautions. Future opening date to be determined. Pt instructed to continue exercising on his own following home exercise guidelines.  Lifting and strenuous activity guidelines discussed.   Pt advised to contact cardiology or PCP PRN symptoms, questions or concerns. Understanding verbalized. Pt denies food insecurity or other needs at this time. Pt offered emotional support and understanding. Pt expressed gratitude for the call and is interested in resuming [facility]II when available.  Andi Hence, RN, BSN Cardiac Pulmonary Rehab

## 2018-06-15 ENCOUNTER — Ambulatory Visit (HOSPITAL_COMMUNITY): Payer: 59

## 2018-06-18 ENCOUNTER — Ambulatory Visit (HOSPITAL_COMMUNITY): Payer: 59

## 2018-06-18 ENCOUNTER — Other Ambulatory Visit: Payer: Self-pay | Admitting: Physician Assistant

## 2018-06-20 ENCOUNTER — Ambulatory Visit (HOSPITAL_COMMUNITY): Payer: 59

## 2018-06-22 ENCOUNTER — Ambulatory Visit (HOSPITAL_COMMUNITY): Payer: 59

## 2018-06-24 ENCOUNTER — Other Ambulatory Visit: Payer: Self-pay | Admitting: Physician Assistant

## 2018-06-25 ENCOUNTER — Ambulatory Visit (HOSPITAL_COMMUNITY): Payer: 59

## 2018-06-27 ENCOUNTER — Ambulatory Visit (HOSPITAL_COMMUNITY): Payer: 59

## 2018-06-29 ENCOUNTER — Ambulatory Visit (HOSPITAL_COMMUNITY): Payer: 59

## 2018-07-02 ENCOUNTER — Ambulatory Visit (HOSPITAL_COMMUNITY): Payer: 59

## 2018-07-04 ENCOUNTER — Ambulatory Visit (HOSPITAL_COMMUNITY): Payer: 59

## 2018-07-06 ENCOUNTER — Ambulatory Visit (HOSPITAL_COMMUNITY): Payer: 59

## 2018-07-09 ENCOUNTER — Ambulatory Visit (HOSPITAL_COMMUNITY): Payer: 59

## 2018-07-11 ENCOUNTER — Ambulatory Visit (HOSPITAL_COMMUNITY): Payer: 59

## 2018-07-13 ENCOUNTER — Ambulatory Visit (HOSPITAL_COMMUNITY): Payer: 59

## 2018-07-16 ENCOUNTER — Ambulatory Visit (HOSPITAL_COMMUNITY): Payer: 59

## 2018-07-18 ENCOUNTER — Ambulatory Visit (HOSPITAL_COMMUNITY): Payer: 59

## 2018-07-20 ENCOUNTER — Ambulatory Visit (HOSPITAL_COMMUNITY): Payer: 59

## 2018-07-23 ENCOUNTER — Ambulatory Visit (HOSPITAL_COMMUNITY): Payer: 59

## 2018-07-23 NOTE — Progress Notes (Signed)
I office visit  Date:  07/24/2018   ID:  Tim Strickland, DOB October 09, 1965, MRN [id number]   Provider Location: Office  PCP:  Lance Sell, NP  Cardiologist:  Shirlee More, MD  Electrophysiologist:  None   Evaluation Performed:  Follow-Up Visit  Chief Complaint:  CAD hyperlipidemia  History of Present Illness:    Tim Strickland is a 53 y.o. male with a hx of CAD and CABG last seen 04/19/18  He is improved return to work strength and endurance is good his complaints include a little bit of keloid in the sternal scar reassured him and told him to put vitamin E on it if it would relieve his concern as it tends to soften scar.  No fever chills no wound drainage.  He has low back pain and some numbness in his feet  Check his lipids liver today and will screen him for nutritional deficiency U88 and folic acid but I suspect this is neuropathic from his low back.  If the symptoms worsen he will require formal evaluation.  He tolerates PCSK9 therapy without side effect and has had no angina dyspnea palpitations syncope or TIA.  His current PCP has left so I recommended the office in [facility].   The patient does not have symptoms concerning for COVID-19 infection (fever, chills, cough, or new shortness of breath).    Past Medical History:  Diagnosis Date  . Coronary artery disease   . Elevated liver enzymes    PMH of  on Pravachol  . Environmental asthma   . Hyperglycemia    A1c 5.2% in 2010  . Hyperlipidemia   . Hypertension    Past Surgical History:  Procedure Laterality Date  . APPENDECTOMY    . CORONARY ARTERY BYPASS GRAFT N/A 03/19/2018   Procedure: CORONARY ARTERY BYPASS GRAFTING (CABG), ON PUMP, TIMES FIVE, USING BILATERAL INTERNAL MAMMARY ARTERIES AND ENDOSCOPICALLY HARVESTED RIGHT GREATER SAPHENOUS VEIN;  Surgeon: Gaye Pollack, MD;  Location: [facility];  Service: Open Heart Surgery;  Laterality: N/A;  . LEFT HEART CATH AND CORONARY ANGIOGRAPHY N/A 03/16/2018   Procedure:  LEFT HEART CATH AND CORONARY ANGIOGRAPHY;  Surgeon: Wellington Hampshire, MD;  Location: [place] [facility];  Service: Cardiovascular;  Laterality: N/A;  . TEE WITHOUT CARDIOVERSION N/A 03/19/2018   Procedure: TRANSESOPHAGEAL ECHOCARDIOGRAM (TEE);  Surgeon: Gaye Pollack, MD;  Location: [place];  Service: Open Heart Surgery;  Laterality: N/A;  . TONSILLECTOMY    . VASECTOMY    . WISDOM TOOTH EXTRACTION       Current Meds  Medication Sig  . albuterol (PROVENTIL HFA;VENTOLIN HFA) 108 (90 Base) MCG/ACT inhaler Inhale 2 puffs into the lungs every 6 (six) hours as needed for wheezing or shortness of breath.  Marland Kitchen aspirin EC 81 MG tablet Take 1 tablet (81 mg total) by mouth daily.  . Evolocumab (REPATHA SURECLICK) 280 MG/ML SOAJ Inject 140 mg into the skin every 14 (fourteen) days.  Marland Kitchen ezetimibe (ZETIA) 10 MG tablet TAKE 1 TABLET BY MOUTH EVERY DAY  . metoprolol tartrate (LOPRESSOR) 25 MG tablet TAKE 1 TABLET BY MOUTH TWICE A DAY  . montelukast (SINGULAIR) 10 MG tablet TAKE 1 TABLET (10 MG TOTAL) BY MOUTH AT BEDTIME. MUST KEEP JAN APP FOR MORE (Patient taking differently: Take 10 mg by mouth at bedtime as needed (allergies). )  . nitroGLYCERIN (NITROSTAT) 0.4 MG SL tablet Place 1 tablet (0.4 mg total) under the tongue every 5 (five) minutes as needed for chest pain.  Allergies:   Pravastatin   Social History   Tobacco Use  . Smoking status: Never Smoker  . Smokeless tobacco: Never Used  Substance Use Topics  . Alcohol use: Yes    Alcohol/week: 18.0 standard drinks    Types: 18 Cans of beer per week    Comment: 03/16/2018 "2-3 beers daily"  . Drug use: Not Currently     Family Hx: The patient's family history includes Autoimmune disease in his mother; Cancer (age of onset: 61) in his paternal uncle; Diabetes in his sister; Gout in his paternal grandfather; Healthy in his mother; Heart attack in his father and paternal grandfather; Mental illness in his maternal grandmother; Stroke in his  paternal grandmother.  ROS:   Please see the history of present illness.     All other systems reviewed and are negative.   Prior CV studies:   The following studies were reviewed today:    Labs/Other Tests and Data Reviewed:    EKG:  No ECG reviewed.  Recent Labs: 03/18/2018: ALT 50 03/20/2018: Magnesium 2.4 03/21/2018: BUN 12; Creatinine, Ser 0.96; Hemoglobin 10.8; Platelets 167; Potassium 4.1; Sodium 136   Recent Lipid Panel Lab Results  Component Value Date/Time   CHOL 239 (H) 01/25/2018 09:26 AM   CHOL 178 01/15/2014 08:01 AM   TRIG 87 01/25/2018 09:26 AM   TRIG 233 (H) 01/15/2014 08:01 AM   HDL 68 01/25/2018 09:26 AM   HDL 62 01/15/2014 08:01 AM   CHOLHDL 3.5 01/25/2018 09:26 AM   CHOLHDL 4 11/02/2016 08:29 AM   LDLCALC 154 (H) 01/25/2018 09:26 AM   LDLCALC 69 01/15/2014 08:01 AM   LDLDIRECT 205.1 12/26/2012 09:38 AM    Wt Readings from Last 3 Encounters:  07/24/18 177 lb (80.3 kg)  05/17/18 179 lb 14.3 oz (81.6 kg)  04/19/18 175 lb 1.9 oz (79.4 kg)     Objective:    Vital Signs:  BP 128/82 (BP Location: Right Arm, Patient Position: Sitting)   Pulse 79   Temp (!) 96.4 F (35.8 C)   Ht 6\' 1"  (1.854 m)   Wt 177 lb (80.3 kg)   BMI 23.35 kg/m    VITAL SIGNS:  reviewed GEN:  no acute distress RESPIRATORY:  normal respiratory effort, symmetric expansion. Lung sounds clear bilaterally, no wheeze, no rale, no crackle.  CARDIOVASCULAR:  no peripheral edema, bilateral DP 2+, bilateral radial pulse 2+. S1, S2 regular heart sounds without murmur.  SKIN:  Warm, dry. Pin-point sized keloid to midsternal incision which are without exudate and erythema. MUSCULOSKELETAL:  No defecit. Paresthesia to dorsal portion of bilateral lower feet. NEURO:  alert and oriented x 3, no obvious focal deficit PSYCH:  normal affect  ASSESSMENT & PLAN:    1. HLD - Currently on PCSK9 and Zetia. Tolerating PCSK9 without difficulty. LDL goal <55. Repeat lipid panel today.  2. HTN -  Stable. Continue current therapy. Recommend heart healthy diet - low salt, foods should be grilled or baked, not fried. Recommend 30 minutes of exercise 4-5 times per week per American Heart Association recommendations.  3. CAD s/p CABG - Stable. No anginal symptoms. Continue GDMT. RLE SVG site clean, dry, intact.  4. Keloid of skin - Pin-sized keloid to midsternal incision. No redness, drainage, dehiscence. May use vitamin E oil. Recommend covering seatbelt to prevent irritation.  5. Paresthesia - Numbness of dorsal surface of bilateral feet associated with lower back pain. Check B12, folate. If normal, refer for formal workup.  COVID-19 Education: The signs  and symptoms of COVID-19 were discussed with the patient and how to seek care for testing (follow up with PCP or arrange E-visit).  The importance of social distancing was discussed today.  Time:   Today, I have spent 25 minutes with the patient with telehealth technology discussing the above problems.     Medication Adjustments/Labs and Tests Ordered: Current medicines are reviewed at length with the patient today.  Concerns regarding medicines are outlined above.   Tests Ordered: No orders of the defined types were placed in this encounter.   Medication Changes: No orders of the defined types were placed in this encounter.   Disposition:  Follow up 6 months. Labs today.  Signed, Shirlee More, MD  07/24/2018 8:50 AM    [place]

## 2018-07-24 ENCOUNTER — Encounter: Payer: Self-pay | Admitting: Cardiology

## 2018-07-24 ENCOUNTER — Other Ambulatory Visit: Payer: Self-pay

## 2018-07-24 ENCOUNTER — Ambulatory Visit (INDEPENDENT_AMBULATORY_CARE_PROVIDER_SITE_OTHER): Payer: 59 | Admitting: Cardiology

## 2018-07-24 VITALS — BP 128/82 | HR 79 | Temp 96.4°F | Ht 73.0 in | Wt 177.0 lb

## 2018-07-24 DIAGNOSIS — E7849 Other hyperlipidemia: Secondary | ICD-10-CM | POA: Diagnosis not present

## 2018-07-24 DIAGNOSIS — L910 Hypertrophic scar: Secondary | ICD-10-CM

## 2018-07-24 DIAGNOSIS — I2510 Atherosclerotic heart disease of native coronary artery without angina pectoris: Secondary | ICD-10-CM | POA: Diagnosis not present

## 2018-07-24 DIAGNOSIS — R202 Paresthesia of skin: Secondary | ICD-10-CM

## 2018-07-24 DIAGNOSIS — I10 Essential (primary) hypertension: Secondary | ICD-10-CM | POA: Diagnosis not present

## 2018-07-24 DIAGNOSIS — Z951 Presence of aortocoronary bypass graft: Secondary | ICD-10-CM

## 2018-07-24 HISTORY — DX: Hypertrophic scar: L91.0

## 2018-07-24 HISTORY — DX: Paresthesia of skin: R20.2

## 2018-07-24 NOTE — Patient Instructions (Signed)
Medication Instructions:  Your physician recommends that you continue on your current medications as directed. Please refer to the Current Medication list given to you today.  If you need a refill on your cardiac medications before your next appointment, please call your pharmacy.   Lab work: Your physician recommends that you return for lab work in: TODAY LIPD,CMP,B12,Folate  If you have labs (blood work) drawn today and your tests are completely normal, you will receive your results only by: Marland Kitchen MyChart Message (if you have MyChart) OR . A paper copy in the mail If you have any lab test that is abnormal or we need to change your treatment, we will call you to review the results.  Testing/Procedures: NOne  Follow-Up: At [facility], you and your health needs are our priority.  As part of our continuing mission to provide you with exceptional heart care, we have created designated Provider Care Teams.  These Care Teams include your primary Cardiologist (physician) and Advanced Practice Providers (APPs -  Physician Assistants and Nurse Practitioners) who all work together to provide you with the care you need, when you need it. You will need a follow up appointment in 6 months.  Any Other Special Instructions Will Be Listed Below (If Applicable).

## 2018-07-25 ENCOUNTER — Ambulatory Visit (HOSPITAL_COMMUNITY): Payer: 59

## 2018-07-25 ENCOUNTER — Telehealth: Payer: Self-pay | Admitting: Cardiology

## 2018-07-25 LAB — COMPREHENSIVE METABOLIC PANEL
ALT: 26 IU/L (ref 0–44)
AST: 25 IU/L (ref 0–40)
Albumin/Globulin Ratio: 2 (ref 1.2–2.2)
Albumin: 4.9 g/dL (ref 3.8–4.9)
Alkaline Phosphatase: 91 IU/L (ref 39–117)
BUN/Creatinine Ratio: 12 (ref 9–20)
BUN: 12 mg/dL (ref 6–24)
Bilirubin Total: 0.7 mg/dL (ref 0.0–1.2)
CO2: 22 mmol/L (ref 20–29)
Calcium: 9.7 mg/dL (ref 8.7–10.2)
Chloride: 100 mmol/L (ref 96–106)
Creatinine, Ser: 1.02 mg/dL (ref 0.76–1.27)
GFR calc Af Amer: 97 mL/min/{1.73_m2} (ref >59)
GFR calc non Af Amer: 84 mL/min/{1.73_m2} (ref >59)
Globulin, Total: 2.4 g/dL (ref 1.5–4.5)
Glucose: 134 mg/dL — ABNORMAL HIGH (ref 65–99)
Potassium: 4.2 mmol/L (ref 3.5–5.2)
Sodium: 139 mmol/L (ref 134–144)
Total Protein: 7.3 g/dL (ref 6.0–8.5)

## 2018-07-25 LAB — LIPID PANEL
Chol/HDL Ratio: 1.6 ratio (ref 0.0–5.0)
Cholesterol, Total: 121 mg/dL (ref 100–199)
HDL: 76 mg/dL (ref >39)
LDL Calculated: 26 mg/dL (ref 0–99)
Triglycerides: 94 mg/dL (ref 0–149)
VLDL Cholesterol Cal: 19 mg/dL (ref 5–40)

## 2018-07-25 LAB — FOLATE: Folate: 16.7 ng/mL (ref >3.0)

## 2018-07-25 LAB — VITAMIN B12: Vitamin B-12: 343 pg/mL (ref 232–1245)

## 2018-07-25 NOTE — Telephone Encounter (Signed)
Virtual Visit Pre-Appointment Phone Call  "(Name), I am calling you today to discuss your upcoming appointment. We are currently trying to limit exposure to the virus that causes COVID-19 by seeing patients at home rather than in the office."  1. "What is the BEST phone number to call the day of the visit?" - include this in appointment notes  2. Do you have or have access to (through a family member/friend) a smartphone with video capability that we can use for your visit?" a. If yes - list this number in appt notes as cell (if different from BEST phone #) and list the appointment type as a VIDEO visit in appointment notes b. If no - list the appointment type as a PHONE visit in appointment notes  3. Confirm consent - "In the setting of the current Covid19 crisis, you are scheduled for a (phone or video) visit with your provider on (date) at (time).  Just as we do with many in-office visits, in order for you to participate in this visit, we must obtain consent.  If you'd like, I can send this to your mychart (if signed up) or email for you to review.  Otherwise, I can obtain your verbal consent now.  All virtual visits are billed to your insurance company just like a normal visit would be.  By agreeing to a virtual visit, we'd like you to understand that the technology does not allow for your provider to perform an examination, and thus may limit your provider's ability to fully assess your condition. If your provider identifies any concerns that need to be evaluated in person, we will make arrangements to do so.  Finally, though the technology is pretty good, we cannot assure that it will always work on either your or our end, and in the setting of a video visit, we may have to convert it to a phone-only visit.  In either situation, we cannot ensure that we have a secure connection.  Are you willing to proceed?" STAFF: Did the patient verbally acknowledge consent to telehealth visit? Document  YES/NO here: Yes patient here but obtained verbal  4. Advise patient to be prepared - "Two hours prior to your appointment, go ahead and check your blood pressure, pulse, oxygen saturation, and your weight (if you have the equipment to check those) and write them all down. When your visit starts, your provider will ask you for this information. If you have an Apple Watch or Kardia device, please plan to have heart rate information ready on the day of your appointment. Please have a pen and paper handy nearby the day of the visit as well."  5. Give patient instructions for MyChart download to smartphone OR Doximity/Doxy.me as below if video visit (depending on what platform provider is using)  6. Inform patient they will receive a phone call 15 minutes prior to their appointment time (may be from unknown caller ID) so they should be prepared to answer    TELEPHONE CALL NOTE  Tim Strickland has been deemed a candidate for a follow-up tele-health visit to limit community exposure during the Covid-19 pandemic. I spoke with the patient via phone to ensure availability of phone/video source, confirm preferred email & phone number, and discuss instructions and expectations.  I reminded JAVION HOLMER to be prepared with any vital sign and/or heart rhythm information that could potentially be obtained via home monitoring, at the time of his visit. I reminded [name] to expect  a phone call prior to his visit.  Calla Kicks 07/25/2018 8:14 AM   INSTRUCTIONS FOR DOWNLOADING THE MYCHART APP TO SMARTPHONE  - The patient must first make sure to have activated MyChart and know their login information - If Apple, go to [employer] and type in MyChart in the search bar and download the app. If Android, ask patient to go to [employer] and type in [place] in the search bar and download the app. The app is free but as with any other app downloads, their phone may require them to verify saved  payment information or Apple/Android password.  - The patient will need to then log into the app with their MyChart username and password, and select [facility] as their healthcare provider to link the account. When it is time for your visit, go to the MyChart app, find appointments, and click Begin Video Visit. Be sure to Select Allow for your device to access the Microphone and Camera for your visit. You will then be connected, and your provider will be with you shortly.  **If they have any issues connecting, or need assistance please contact MyChart service desk [phone])**  **If using a computer, in order to ensure the best quality for their visit they will need to use either of the following Internet Browsers: [employer], or Google Chrome**  IF USING DOXIMITY or DOXY.ME - The patient will receive a link just prior to their visit by text.     FULL LENGTH CONSENT FOR TELE-HEALTH VISIT   I hereby voluntarily request, consent and authorize [facility] and its employed or contracted physicians, physician assistants, nurse practitioners or other licensed health care professionals (the Practitioner), to provide me with telemedicine health care services (the Services") as deemed necessary by the treating Practitioner. I acknowledge and consent to receive the Services by the Practitioner via telemedicine. I understand that the telemedicine visit will involve communicating with the Practitioner through live audiovisual communication technology and the disclosure of certain medical information by electronic transmission. I acknowledge that I have been given the opportunity to request an in-person assessment or other available alternative prior to the telemedicine visit and am voluntarily participating in the telemedicine visit.  I understand that I have the right to withhold or withdraw my consent to the use of telemedicine in the course of my care at any time, without affecting  my right to future care or treatment, and that the Practitioner or I may terminate the telemedicine visit at any time. I understand that I have the right to inspect all information obtained and/or recorded in the course of the telemedicine visit and may receive copies of available information for a reasonable fee.  I understand that some of the potential risks of receiving the Services via telemedicine include:   Delay or interruption in medical evaluation due to technological equipment failure or disruption;  Information transmitted may not be sufficient (e.g. poor resolution of images) to allow for appropriate medical decision making by the Practitioner; and/or   In rare instances, security protocols could fail, causing a breach of personal health information.  Furthermore, I acknowledge that it is my responsibility to provide information about my medical history, conditions and care that is complete and accurate to the best of my ability. I acknowledge that Practitioner's advice, recommendations, and/or decision may be based on factors not within their control, such as incomplete or inaccurate data provided by me or distortions of diagnostic images or specimens that may  result from electronic transmissions. I understand that the practice of medicine is not an exact science and that Practitioner makes no warranties or guarantees regarding treatment outcomes. I acknowledge that I will receive a copy of this consent concurrently upon execution via email to the email address I last provided but may also request a printed copy by calling the office of [facility].    I understand that my insurance will be billed for this visit.   I have read or had this consent read to me.  I understand the contents of this consent, which adequately explains the benefits and risks of the Services being provided via telemedicine.   I have been provided ample opportunity to ask questions regarding this consent and the  Services and have had my questions answered to my satisfaction.  I give my informed consent for the services to be provided through the use of telemedicine in my medical care  By participating in this telemedicine visit I agree to the above.

## 2018-07-27 ENCOUNTER — Ambulatory Visit (HOSPITAL_COMMUNITY): Payer: 59

## 2018-07-31 ENCOUNTER — Telehealth (HOSPITAL_COMMUNITY): Payer: Self-pay

## 2018-07-31 NOTE — Telephone Encounter (Signed)
Phone call made to Pt to provide information about virtual cardiac rehab. Pt did not answer. Voicemail was left for Pt to return call.

## 2018-08-01 ENCOUNTER — Ambulatory Visit (HOSPITAL_COMMUNITY): Payer: 59

## 2018-08-02 ENCOUNTER — Telehealth (HOSPITAL_COMMUNITY): Payer: Self-pay | Admitting: *Deleted

## 2018-08-02 NOTE — Telephone Encounter (Signed)
No response from message left on voicemail regarding Virtual Cardiac Rehab for the continuation of her participation.  Please contact letter sent to address on file in epic. Await response, if no response by June 12th will discharge pt from the program.  Khayri Kargbo RN, BSN Cardiac and Pulmonary Rehab Nurse Navigator   

## 2018-08-03 ENCOUNTER — Ambulatory Visit (HOSPITAL_COMMUNITY): Payer: 59

## 2018-08-06 ENCOUNTER — Ambulatory Visit (HOSPITAL_COMMUNITY): Payer: 59

## 2018-08-08 ENCOUNTER — Ambulatory Visit (HOSPITAL_COMMUNITY): Payer: 59

## 2018-08-10 ENCOUNTER — Ambulatory Visit (HOSPITAL_COMMUNITY): Payer: 59

## 2018-08-13 ENCOUNTER — Ambulatory Visit (HOSPITAL_COMMUNITY): Payer: 59

## 2018-08-15 ENCOUNTER — Ambulatory Visit (HOSPITAL_COMMUNITY): Payer: 59

## 2018-08-17 ENCOUNTER — Ambulatory Visit (HOSPITAL_COMMUNITY): Payer: 59

## 2018-08-20 ENCOUNTER — Ambulatory Visit (HOSPITAL_COMMUNITY): Payer: 59

## 2018-08-22 ENCOUNTER — Ambulatory Visit (HOSPITAL_COMMUNITY): Payer: 59

## 2018-08-22 NOTE — Addendum Note (Signed)
Encounter addended by: Jeralyn Bennett on: 08/22/2018 11:47 AM  Actions taken: Clinical Note Signed

## 2018-08-22 NOTE — Addendum Note (Signed)
Encounter addended by: Jeralyn Bennett on: 08/22/2018 12:01 PM  Actions taken: Clinical Note Signed

## 2018-08-22 NOTE — Progress Notes (Addendum)
Pt never returned to CR after initial orientation. Phone calls were made to Pt as well as a letter through the mail to encourage Pt to return our call. Pt did not respond. Pt will be discharged from CR.  Deitra Mayo BS, ACSM CEP 08/22/2018 12:01 PM

## 2018-08-24 ENCOUNTER — Ambulatory Visit (HOSPITAL_COMMUNITY): Payer: 59

## 2018-08-27 ENCOUNTER — Encounter (HOSPITAL_COMMUNITY): Payer: Self-pay | Admitting: *Deleted

## 2018-08-27 DIAGNOSIS — Z951 Presence of aortocoronary bypass graft: Secondary | ICD-10-CM

## 2018-08-27 NOTE — Progress Notes (Signed)
Tim Strickland attended orientation on 05/17/18 right before Cardiac rehab closed due to the [employer] 19 pandemic. We attempted to reach out to Tim Strickland about our virtual cardiac rehab program and have heard no response. Will discharge due to no response.

## 2018-09-10 ENCOUNTER — Telehealth: Payer: Self-pay | Admitting: Cardiology

## 2018-09-10 NOTE — Telephone Encounter (Signed)
New Message     Estill Bamberg is calling from [facility] office and is needing to know if the pt needs to take any medication before a cleaning

## 2018-09-10 NOTE — Telephone Encounter (Signed)
Phoned patient to get information about dental procedure. Left voice message with callback number.

## 2018-09-10 NOTE — Telephone Encounter (Signed)
Patient returned call. He has already picked up amoxicillin rx  From pharmacy to take prior to his dental cleaning this afternoon. He is confused and doesn't know why dentist office called [place].   pls advise

## 2018-12-18 ENCOUNTER — Other Ambulatory Visit: Payer: Self-pay | Admitting: Physician Assistant

## 2018-12-18 ENCOUNTER — Other Ambulatory Visit: Payer: Self-pay | Admitting: Cardiology

## 2019-01-29 ENCOUNTER — Encounter: Payer: Self-pay | Admitting: Cardiology

## 2019-01-29 ENCOUNTER — Other Ambulatory Visit: Payer: Self-pay

## 2019-01-29 ENCOUNTER — Ambulatory Visit (INDEPENDENT_AMBULATORY_CARE_PROVIDER_SITE_OTHER): Payer: 59 | Admitting: Cardiology

## 2019-01-29 VITALS — BP 134/84 | HR 64 | Ht 73.0 in | Wt 176.4 lb

## 2019-01-29 DIAGNOSIS — I10 Essential (primary) hypertension: Secondary | ICD-10-CM

## 2019-01-29 DIAGNOSIS — I2510 Atherosclerotic heart disease of native coronary artery without angina pectoris: Secondary | ICD-10-CM | POA: Diagnosis not present

## 2019-01-29 DIAGNOSIS — E7849 Other hyperlipidemia: Secondary | ICD-10-CM | POA: Diagnosis not present

## 2019-01-29 NOTE — Patient Instructions (Signed)
Medication Instructions:  Your physician recommends that you continue on your current medications as directed. Please refer to the Current Medication list given to you today.  *If you need a refill on your cardiac medications before your next appointment, please call your pharmacy*  Lab Work: Your physician recommends that you return for lab work today: CMP, lipid panel.   If you have labs (blood work) drawn today and your tests are completely normal, you will receive your results only by: Marland Kitchen MyChart Message (if you have MyChart) OR . A paper copy in the mail If you have any lab test that is abnormal or we need to change your treatment, we will call you to review the results.  Testing/Procedures: You had an EKG today.   Follow-Up: At [facility], you and your health needs are our priority.  As part of our continuing mission to provide you with exceptional heart care, we have created designated Provider Care Teams.  These Care Teams include your primary Cardiologist (physician) and Advanced Practice Providers (APPs -  Physician Assistants and Nurse Practitioners) who all work together to provide you with the care you need, when you need it.  Your next appointment:   1 year(s)  The format for your next appointment:   In Person  Provider:   Shirlee More, MD  Other Instructions **Please contact [facility] at [phone] to schedule an appointment with Mackie Pai, PA.

## 2019-01-29 NOTE — Progress Notes (Signed)
Cardiology Office Note:    Date:  01/29/2019   ID:  Tim Strickland, DOB 1966/01/20, MRN [id number]  PCP:  Lance Sell, NP  Cardiologist:  Shirlee More, MD    Referring MD: Lance Sell, NP    ASSESSMENT:    1. CAD in native artery   2. Familial hyperlipidemia   3. Essential hypertension    PLAN:    In order of problems listed above:  1. Stable CAD after bypass surgery continue medical treatment with aspirin beta-blocker and lipid-lowering treatment of PCSK9 and Zetia.  He is statin intolerant 2. Point response with ideal lipids recheck lipid profile today liver function 3. Stable controlled with good lifestyle habits ideal weight and beta-blocker   Next appointment: 1 year   Medication Adjustments/Labs and Tests Ordered: Current medicines are reviewed at length with the patient today.  Concerns regarding medicines are outlined above.  No orders of the defined types were placed in this encounter.  No orders of the defined types were placed in this encounter.   Chief Complaint  Patient presents with  . Follow-up  . Coronary Artery Disease    History of Present Illness:    Tim Strickland is a 53 y.o. male with a hx of CAD and CABG  last seen 07/24/2018. Compliance with diet, lifestyle and medications: Yes   Ref Range & Units 54mo ago 53yr ago 57yr ago 78yr ago  Cholesterol, Total 100 - 199 mg/dL 121  239High      Triglycerides 0 - 149 mg/dL 94  87  69.0 R, CM  154.0High  R, CM   HDL >39 mg/dL 76  68  56.20 R  64.10 R   VLDL Cholesterol Cal 5 - 40 mg/dL 19  17     LDL Calculated 0 - 99 mg/dL 26  154High   153High   110High    Chol/HDL Ratio 0.0 - 5.0 ratio 1.6  3.5 CM  4 R, CM  3    He is doing well has made a full recovery no angina dyspnea palpitation or syncope.  He remains a little bit cardiac apprehensive because of his father's early heart disease difficult course in life.  He tolerates his PCSK9 and Zetia continue the same we will check a  lipid profile today along with a CMP. Past Medical History:  Diagnosis Date  . Coronary artery disease   . Elevated liver enzymes    PMH of  on Pravachol  . Environmental asthma   . Hyperglycemia    A1c 5.2% in 2010  . Hyperlipidemia   . Hypertension     Past Surgical History:  Procedure Laterality Date  . APPENDECTOMY    . CORONARY ARTERY BYPASS GRAFT N/A 03/19/2018   Procedure: CORONARY ARTERY BYPASS GRAFTING (CABG), ON PUMP, TIMES FIVE, USING BILATERAL INTERNAL MAMMARY ARTERIES AND ENDOSCOPICALLY HARVESTED RIGHT GREATER SAPHENOUS VEIN;  Surgeon: Gaye Pollack, MD;  Location: [facility];  Service: Open Heart Surgery;  Laterality: N/A;  . LEFT HEART CATH AND CORONARY ANGIOGRAPHY N/A 03/16/2018   Procedure: LEFT HEART CATH AND CORONARY ANGIOGRAPHY;  Surgeon: Wellington Hampshire, MD;  Location: [facility];  Service: Cardiovascular;  Laterality: N/A;  . TEE WITHOUT CARDIOVERSION N/A 03/19/2018   Procedure: TRANSESOPHAGEAL ECHOCARDIOGRAM (TEE);  Surgeon: Gaye Pollack, MD;  Location: [facility];  Service: Open Heart Surgery;  Laterality: N/A;  . TONSILLECTOMY    . VASECTOMY    . WISDOM TOOTH EXTRACTION  Current Medications: Current Meds  Medication Sig  . albuterol (PROVENTIL HFA;VENTOLIN HFA) 108 (90 Base) MCG/ACT inhaler Inhale 2 puffs into the lungs every 6 (six) hours as needed for wheezing or shortness of breath.  Marland Kitchen amoxicillin (AMOXIL) 500 MG capsule TAKE 4 CAPSULES 1 HOUR PRIOR TO DENTAL APPOINTMENT  . aspirin EC 81 MG tablet Take 1 tablet (81 mg total) by mouth daily.  . Evolocumab (REPATHA SURECLICK) [id number] MG/ML SOAJ Inject 140 mg into the skin every 14 (fourteen) days.  Marland Kitchen ezetimibe (ZETIA) 10 MG tablet TAKE 1 TABLET BY MOUTH EVERY DAY  . metoprolol tartrate (LOPRESSOR) 25 MG tablet TAKE 1 TABLET BY MOUTH TWICE A DAY  . nitroGLYCERIN (NITROSTAT) 0.4 MG SL tablet Place 1 tablet (0.4 mg total) under the tongue every 5 (five) minutes as needed for chest pain.     Allergies:    Pravastatin   Social History   Socioeconomic History  . Marital status: Married    Spouse name: Not on file  . Number of children: 2  . Years of education: 66  . Highest education level: Not on file  Occupational History  . Occupation: Programme researcher, broadcasting/film/video  Social Needs  . Financial resource strain: Not on file  . Food insecurity    Worry: Not on file    Inability: Not on file  . Transportation needs    Medical: Not on file    Non-medical: Not on file  Tobacco Use  . Smoking status: Never Smoker  . Smokeless tobacco: Never Used  Substance and Sexual Activity  . Alcohol use: Yes    Alcohol/week: 18.0 standard drinks    Types: 18 Cans of beer per week    Comment: 03/16/2018 "2-3 beers daily"  . Drug use: Not Currently  . Sexual activity: Yes  Lifestyle  . Physical activity    Days per week: Not on file    Minutes per session: Not on file  . Stress: Not on file  Relationships  . Social Herbalist on phone: Not on file    Gets together: Not on file    Attends religious service: Not on file    Active member of club or organization: Not on file    Attends meetings of clubs or organizations: Not on file    Relationship status: Not on file  Other Topics Concern  . Not on file  Social History Narrative  . Not on file     Family History: The patient's family history includes Autoimmune disease in his mother; Cancer (age of onset: 82) in his paternal uncle; Diabetes in his sister; Gout in his paternal grandfather; Healthy in his mother; Heart attack in his father and paternal grandfather; Mental illness in his maternal grandmother; Stroke in his paternal grandmother. ROS:   Please see the history of present illness.    All other systems reviewed and are negative.  EKGs/Labs/Other Studies Reviewed:    The following studies were reviewed today:  EKG:  EKG ordered today and personally reviewed.  The ekg ordered today demonstrates sinus rhythm and normal  Recent Labs:  03/20/2018: Magnesium 2.4 03/21/2018: Hemoglobin 10.8; Platelets 167 07/24/2018: ALT 26; BUN 12; Creatinine, Ser 1.02; Potassium 4.2; Sodium 139  Recent Lipid Panel    Component Value Date/Time   CHOL 121 07/24/2018 0918   CHOL 178 01/15/2014 0801   TRIG 94 07/24/2018 0918   TRIG 233 (H) 01/15/2014 0801   HDL 76 07/24/2018 0918   HDL 62 01/15/2014 0801  CHOLHDL 1.6 07/24/2018 0918   CHOLHDL 4 11/02/2016 0829   VLDL 13.8 11/02/2016 0829   LDLCALC 26 07/24/2018 0918   LDLCALC 69 01/15/2014 0801   LDLDIRECT 205.1 12/26/2012 0938    Physical Exam:    VS:  BP 134/84 (BP Location: Right Arm, Patient Position: Sitting, Cuff Size: Normal)   Pulse 64   Ht 6\' 1"  (1.854 m)   Wt 176 lb 6.4 oz (80 kg)   SpO2 98%   BMI 23.27 kg/m     Wt Readings from Last 3 Encounters:  01/29/19 176 lb 6.4 oz (80 kg)  07/24/18 177 lb (80.3 kg)  05/17/18 179 lb 14.3 oz (81.6 kg)     GEN:  Well nourished, well developed in no acute distress HEENT: Normal NECK: No JVD; No carotid bruits LYMPHATICS: No lymphadenopathy CARDIAC: RRR, no murmurs, rubs, gallops he has a keloid sternal scar RESPIRATORY:  Clear to auscultation without rales, wheezing or rhonchi  ABDOMEN: Soft, non-tender, non-distended MUSCULOSKELETAL:  No edema; No deformity  SKIN: Warm and dry NEUROLOGIC:  Alert and oriented x 3 PSYCHIATRIC:  Normal affect    Signed, Shirlee More, MD  01/29/2019 8:40 AM    [place]

## 2019-01-30 LAB — COMPREHENSIVE METABOLIC PANEL
ALT: 24 IU/L (ref 0–44)
AST: 21 IU/L (ref 0–40)
Albumin/Globulin Ratio: 2.1 (ref 1.2–2.2)
Albumin: 4.6 g/dL (ref 3.8–4.9)
Alkaline Phosphatase: 70 IU/L (ref 39–117)
BUN/Creatinine Ratio: 15 (ref 9–20)
BUN: 14 mg/dL (ref 6–24)
Bilirubin Total: 0.3 mg/dL (ref 0.0–1.2)
CO2: 22 mmol/L (ref 20–29)
Calcium: 9.4 mg/dL (ref 8.7–10.2)
Chloride: 103 mmol/L (ref 96–106)
Creatinine, Ser: 0.94 mg/dL (ref 0.76–1.27)
GFR calc Af Amer: 107 mL/min/{1.73_m2} (ref >59)
GFR calc non Af Amer: 93 mL/min/{1.73_m2} (ref >59)
Globulin, Total: 2.2 g/dL (ref 1.5–4.5)
Glucose: 93 mg/dL (ref 65–99)
Potassium: 4.6 mmol/L (ref 3.5–5.2)
Sodium: 140 mmol/L (ref 134–144)
Total Protein: 6.8 g/dL (ref 6.0–8.5)

## 2019-01-30 LAB — LIPID PANEL
Chol/HDL Ratio: 1.7 ratio (ref 0.0–5.0)
Cholesterol, Total: 116 mg/dL (ref 100–199)
HDL: 70 mg/dL (ref >39)
LDL Chol Calc (NIH): 27 mg/dL (ref 0–99)
Triglycerides: 101 mg/dL (ref 0–149)
VLDL Cholesterol Cal: 19 mg/dL (ref 5–40)

## 2019-03-29 ENCOUNTER — Other Ambulatory Visit: Payer: Self-pay | Admitting: Cardiology

## 2019-05-23 ENCOUNTER — Other Ambulatory Visit: Payer: Self-pay | Admitting: Cardiology

## 2019-06-10 ENCOUNTER — Telehealth: Payer: Self-pay

## 2019-06-10 DIAGNOSIS — E7849 Other hyperlipidemia: Secondary | ICD-10-CM

## 2019-06-10 NOTE — Telephone Encounter (Signed)
Called and left the patient a detailed message on his voicemail letting him know that a referral has been put in for him to see the Lipid clinic and that he should hear from them within the next week to schedule this appointment. I also let him know that the lipid clinic should be able to help get him started on Repatha.   I advised him that if he does not hear from them within the next week to please let us know.    Encouraged patient to call back with any questions or concerns.

## 2019-06-12 NOTE — Telephone Encounter (Signed)
Follow up   Patient is returning you call. Please call.

## 2019-06-12 NOTE — Telephone Encounter (Signed)
Patient called back stating that he is not having any issues getting his Repatha medication and does not need to go see the lipid clinic or get the prior-auth done for the [facility].   I encouraged him to call back if he had any questions or concerns.

## 2019-06-12 NOTE — Telephone Encounter (Signed)
Left message on patients voicemail to please return our call.   

## 2019-07-13 ENCOUNTER — Other Ambulatory Visit: Payer: Self-pay | Admitting: Cardiology

## 2019-07-18 ENCOUNTER — Ambulatory Visit: Payer: 59

## 2020-01-05 ENCOUNTER — Other Ambulatory Visit: Payer: Self-pay | Admitting: Cardiology

## 2020-02-05 DIAGNOSIS — R739 Hyperglycemia, unspecified: Secondary | ICD-10-CM | POA: Insufficient documentation

## 2020-02-05 DIAGNOSIS — J45909 Unspecified asthma, uncomplicated: Secondary | ICD-10-CM | POA: Insufficient documentation

## 2020-02-05 DIAGNOSIS — I2510 Atherosclerotic heart disease of native coronary artery without angina pectoris: Secondary | ICD-10-CM | POA: Insufficient documentation

## 2020-02-05 DIAGNOSIS — E785 Hyperlipidemia, unspecified: Secondary | ICD-10-CM | POA: Insufficient documentation

## 2020-02-05 DIAGNOSIS — R748 Abnormal levels of other serum enzymes: Secondary | ICD-10-CM | POA: Insufficient documentation

## 2020-02-05 DIAGNOSIS — I10 Essential (primary) hypertension: Secondary | ICD-10-CM | POA: Insufficient documentation

## 2020-02-11 NOTE — Progress Notes (Deleted)
Cardiology Office Note:    Date:  02/11/2020   ID:  Tim Strickland, DOB 15-Jun-1965, MRN [id number]  PCP:  Lance Sell, NP  Cardiologist:  Shirlee More, MD    Referring MD: Lance Sell, NP    ASSESSMENT:    No diagnosis found. PLAN:    In order of problems listed above:  1. ***   Next appointment: ***   Medication Adjustments/Labs and Tests Ordered: Current medicines are reviewed at length with the patient today.  Concerns regarding medicines are outlined above.  No orders of the defined types were placed in this encounter.  No orders of the defined types were placed in this encounter.   No chief complaint on file.   History of Present Illness:    Tim Strickland is a 54 y.o. male with a hx of CAD CABG 03/19/2018 familial hyperlipidemia and essential hypertension last seen 01/29/2019. Compliance with diet, lifestyle and medications: *** Past Medical History:  Diagnosis Date  . Asthma 12/05/2008   Qualifier: Diagnosis of  By: Linna Darner MD, William   Onset @ age 24 after environmental exposures (home built in 60s). No  maintenance agents; Rx: avoidance of triggers (paint fumes, dust) Singulair & albuterol prn    . Bursitis of right shoulder 01/15/2014   Ultrasound guided injection 01/15/2014   . CAD (coronary artery disease), native coronary artery 01/25/2018  . Coronary artery disease   . Elevated liver enzymes    PMH of  on Pravachol  . Encounter for general adult medical examination with abnormal findings 11/04/2016  . Environmental asthma   . Familial hyperlipidemia 12/05/2008   Qualifier: Diagnosis of  By: Linna Darner MD, Cristopher Estimable Lipoprofile 2006: LDL 162 ( 1985/ 1351), HDL 51, TG 108.  NMR Lipoprofile 01/2014 : LDL 69 ( 885 / 601  ),  HDL 62 , Triglycerides 233 . LP-IR 62. LDL goal= < 110, ideally < 80. Father MI @ before 28; PGF MI in 14s. PGM CVA in 60s Note: previously on Pravastatin 20 mg ; stopped because  mild hepatic enzyme rise. [place]  Heart Panel 01/2011: NOT h  . HTN (hypertension) 03/18/2018  . Hyperglycemia    A1c 5.2% in 2010  . Hyperlipidemia   . Hypertension   . Keloid of skin 07/24/2018  . Left wrist tendinitis 02/23/2016  . Meralgia paraesthetica, left 04/19/2018  . Paresthesia 07/24/2018  . Right rotator cuff tear 04/26/2016   Injected A 17 2018.  . S/P CABG x 5    RIMA to LAD LIMA to Diagonal 2 SVG to Diagonal 1 Sequential SVG to PD and PLB   . Shoulder impingement syndrome, right 02/23/2016  . Synovitis of toe 02/23/2016    Past Surgical History:  Procedure Laterality Date  . APPENDECTOMY    . CORONARY ARTERY BYPASS GRAFT N/A 03/19/2018   Procedure: CORONARY ARTERY BYPASS GRAFTING (CABG), ON PUMP, TIMES FIVE, USING BILATERAL INTERNAL MAMMARY ARTERIES AND ENDOSCOPICALLY HARVESTED RIGHT GREATER SAPHENOUS VEIN;  Surgeon: Gaye Pollack, MD;  Location: [facility];  Service: Open Heart Surgery;  Laterality: N/A;  . LEFT HEART CATH AND CORONARY ANGIOGRAPHY N/A 03/16/2018   Procedure: LEFT HEART CATH AND CORONARY ANGIOGRAPHY;  Surgeon: Wellington Hampshire, MD;  Location: [facility];  Service: Cardiovascular;  Laterality: N/A;  . TEE WITHOUT CARDIOVERSION N/A 03/19/2018   Procedure: TRANSESOPHAGEAL ECHOCARDIOGRAM (TEE);  Surgeon: Gaye Pollack, MD;  Location: [facility];  Service: Open Heart Surgery;  Laterality: N/A;  . TONSILLECTOMY    .  VASECTOMY    . WISDOM TOOTH EXTRACTION      Current Medications: No outpatient medications have been marked as taking for the 02/12/20 encounter (Appointment) with Richardo Priest, MD.     Allergies:   Pravastatin   Social History   Socioeconomic History  . Marital status: Married    Spouse name: Not on file  . Number of children: 2  . Years of education: 24  . Highest education level: Not on file  Occupational History  . Occupation: Remodler  Tobacco Use  . Smoking status: Never Smoker  . Smokeless tobacco: Never Used  Vaping Use  . Vaping Use: Never used   Substance and Sexual Activity  . Alcohol use: Yes    Alcohol/week: 18.0 standard drinks    Types: 18 Cans of beer per week    Comment: 03/16/2018 "2-3 beers daily"  . Drug use: Not Currently  . Sexual activity: Yes  Other Topics Concern  . Not on file  Social History Narrative  . Not on file   Social Determinants of Health   Financial Resource Strain:   . Difficulty of Paying Living Expenses: Not on file  Food Insecurity:   . Worried About Charity fundraiser in the Last Year: Not on file  . Ran Out of Food in the Last Year: Not on file  Transportation Needs:   . Lack of Transportation (Medical): Not on file  . Lack of Transportation (Non-Medical): Not on file  Physical Activity:   . Days of Exercise per Week: Not on file  . Minutes of Exercise per Session: Not on file  Stress:   . Feeling of Stress : Not on file  Social Connections:   . Frequency of Communication with Friends and Family: Not on file  . Frequency of Social Gatherings with Friends and Family: Not on file  . Attends Religious Services: Not on file  . Active Member of Clubs or Organizations: Not on file  . Attends Archivist Meetings: Not on file  . Marital Status: Not on file     Family History: The patient's ***family history includes Autoimmune disease in his mother; Cancer (age of onset: 58) in his paternal uncle; Diabetes in his sister; Gout in his paternal grandfather; Healthy in his mother; Heart attack in his father and paternal grandfather; Mental illness in his maternal grandmother; Stroke in his paternal grandmother. ROS:   Please see the history of present illness.    All other systems reviewed and are negative.  EKGs/Labs/Other Studies Reviewed:    The following studies were reviewed today:  EKG:  EKG ordered today and personally reviewed.  The ekg ordered today demonstrates ***  Recent Labs: No results found for requested labs within last 8760 hours.  Recent Lipid Panel     Component Value Date/Time   CHOL 116 01/29/2019 0854   CHOL 178 01/15/2014 0801   TRIG 101 01/29/2019 0854   TRIG 233 (H) 01/15/2014 0801   HDL 70 01/29/2019 0854   HDL 62 01/15/2014 0801   CHOLHDL 1.7 01/29/2019 0854   CHOLHDL 4 11/02/2016 0829   VLDL 13.8 11/02/2016 0829   LDLCALC 27 01/29/2019 0854   LDLCALC 69 01/15/2014 0801   LDLDIRECT 205.1 12/26/2012 0938    Physical Exam:    VS:  There were no vitals taken for this visit.    Wt Readings from Last 3 Encounters:  01/29/19 176 lb 6.4 oz (80 kg)  07/24/18 177 lb (80.3 kg)  05/17/18 179 lb 14.3 oz (81.6 kg)     GEN: *** Well nourished, well developed in no acute distress HEENT: Normal NECK: No JVD; No carotid bruits LYMPHATICS: No lymphadenopathy CARDIAC: ***RRR, no murmurs, rubs, gallops RESPIRATORY:  Clear to auscultation without rales, wheezing or rhonchi  ABDOMEN: Soft, non-tender, non-distended MUSCULOSKELETAL:  No edema; No deformity  SKIN: Warm and dry NEUROLOGIC:  Alert and oriented x 3 PSYCHIATRIC:  Normal affect    Signed, Shirlee More, MD  02/11/2020 5:08 PM    [place]

## 2020-02-12 ENCOUNTER — Other Ambulatory Visit: Payer: Self-pay

## 2020-02-12 ENCOUNTER — Ambulatory Visit (INDEPENDENT_AMBULATORY_CARE_PROVIDER_SITE_OTHER): Payer: 59 | Admitting: Cardiology

## 2020-02-12 ENCOUNTER — Ambulatory Visit: Payer: 59 | Admitting: Cardiology

## 2020-02-12 ENCOUNTER — Encounter: Payer: Self-pay | Admitting: Cardiology

## 2020-02-12 VITALS — BP 126/74 | HR 74 | Ht 73.0 in | Wt 174.1 lb

## 2020-02-12 DIAGNOSIS — I2510 Atherosclerotic heart disease of native coronary artery without angina pectoris: Secondary | ICD-10-CM

## 2020-02-12 DIAGNOSIS — E7849 Other hyperlipidemia: Secondary | ICD-10-CM

## 2020-02-12 DIAGNOSIS — I10 Essential (primary) hypertension: Secondary | ICD-10-CM | POA: Diagnosis not present

## 2020-02-12 NOTE — Patient Instructions (Signed)
Medication Instructions:  °Your physician recommends that you continue on your current medications as directed. Please refer to the Current Medication list given to you today. ° °*If you need a refill on your cardiac medications before your next appointment, please call your pharmacy* ° ° °Lab Work: °Your physician recommends that you return for lab work in: TODAY °CMP, Lipids, LPA °If you have labs (blood work) drawn today and your tests are completely normal, you will receive your results only by: °• MyChart Message (if you have MyChart) OR °• A paper copy in the mail °If you have any lab test that is abnormal or we need to change your treatment, we will call you to review the results. ° ° °Testing/Procedures: °None ° ° °Follow-Up: °At [facility], you and your health needs are our priority.  As part of our continuing mission to provide you with exceptional heart care, we have created designated Provider Care Teams.  These Care Teams include your primary Cardiologist (physician) and Advanced Practice Providers (APPs -  Physician Assistants and Nurse Practitioners) who all work together to provide you with the care you need, when you need it. ° °We recommend signing up for the patient portal called "MyChart".  Sign up information is provided on this After Visit Summary.  MyChart is used to connect with patients for Virtual Visits (Telemedicine).  Patients are able to view lab/test results, encounter notes, upcoming appointments, etc.  Non-urgent messages can be sent to your provider as well.   °To learn more about what you can do with MyChart, go to [email or web address].   ° °Your next appointment:   °1 year(s) ° °The format for your next appointment:   °In Person ° °Provider:   °Brian Munley, MD ° ° °Other Instructions ° ° °

## 2020-02-12 NOTE — Progress Notes (Signed)
Cardiology Office Note:    Date:  02/12/2020   ID:  Tim Strickland, DOB Jan 21, 1966, MRN [id number]  PCP:  Lance Sell, NP  Cardiologist:  Shirlee More, MD    Referring MD: Lance Sell, NP    ASSESSMENT:    1. CAD in native artery   2. Hypertension, unspecified type   3. Familial hyperlipidemia    PLAN:    In order of problems listed above:  1. Stable CAD New York Heart Association class I having no angina on medical therapy continue aspirin beta-blocker and lipid-lowering Zetia plus PCSK9 inhibitor 2. Stable BP at target continue beta-blocker 3. Continue combined therapy check liver function lipid profile and LP(a) level   Next appointment: 1 year   Medication Adjustments/Labs and Tests Ordered: Current medicines are reviewed at length with the patient today.  Concerns regarding medicines are outlined above.  Orders Placed This Encounter  Procedures  . Comprehensive metabolic panel  . Lipid panel  . Lipoprotein A (LPA)  . EKG 12-Lead   No orders of the defined types were placed in this encounter.   Chief Complaint  Patient presents with  . Follow-up  . Coronary Artery Disease  . Hyperlipidemia    History of Present Illness:    Tim Strickland is a 54 y.o. male with a hx of CAD CABG January 2020 hypertension and familial hyperlipidemia last seen 01/29/2019. Compliance with diet, lifestyle and medications: Yes  He has occasional dysesthesia in the left chest but is not having angina edema shortness of breath chest pain palpitation or syncope he is comfortable using Repatha and has had no muscular symptoms.  He has had 2 doses of mRNA vaccine I strongly encouraged him to get his third dose. Past Medical History:  Diagnosis Date  . Asthma 12/05/2008   Qualifier: Diagnosis of  By: Linna Darner MD, William   Onset @ age 6 after environmental exposures (home built in 25s). No  maintenance agents; Rx: avoidance of triggers (paint fumes, dust) Singulair  & albuterol prn    . Bursitis of right shoulder 01/15/2014   Ultrasound guided injection 01/15/2014   . CAD (coronary artery disease), native coronary artery 01/25/2018  . Coronary artery disease   . Elevated liver enzymes    PMH of  on Pravachol  . Encounter for general adult medical examination with abnormal findings 11/04/2016  . Environmental asthma   . Familial hyperlipidemia 12/05/2008   Qualifier: Diagnosis of  By: Linna Darner MD, Cristopher Estimable Lipoprofile 2006: LDL 162 ( 1985/ 1351), HDL 51, TG 108.  NMR Lipoprofile 01/2014 : LDL 69 ( 885 / 601  ),  HDL 62 , Triglycerides 233 . LP-IR 62. LDL goal= < 110, ideally < 80. Father MI @ before 17; PGF MI in 80s. PGM CVA in 60s Note: previously on Pravastatin 20 mg ; stopped because  mild hepatic enzyme rise. [facility] Heart Panel 01/2011: NOT h  . HTN (hypertension) 03/18/2018  . Hyperglycemia    A1c 5.2% in 2010  . Hyperlipidemia   . Hypertension   . Keloid of skin 07/24/2018  . Left wrist tendinitis 02/23/2016  . Meralgia paraesthetica, left 04/19/2018  . Paresthesia 07/24/2018  . Right rotator cuff tear 04/26/2016   Injected A 17 2018.  . S/P CABG x 5    RIMA to LAD LIMA to Diagonal 2 SVG to Diagonal 1 Sequential SVG to PD and PLB   . Shoulder impingement syndrome, right 02/23/2016  . Synovitis of toe 02/23/2016  Past Surgical History:  Procedure Laterality Date  . APPENDECTOMY    . CORONARY ARTERY BYPASS GRAFT N/A 03/19/2018   Procedure: CORONARY ARTERY BYPASS GRAFTING (CABG), ON PUMP, TIMES FIVE, USING BILATERAL INTERNAL MAMMARY ARTERIES AND ENDOSCOPICALLY HARVESTED RIGHT GREATER SAPHENOUS VEIN;  Surgeon: Gaye Pollack, MD;  Location: [facility];  Service: Open Heart Surgery;  Laterality: N/A;  . LEFT HEART CATH AND CORONARY ANGIOGRAPHY N/A 03/16/2018   Procedure: LEFT HEART CATH AND CORONARY ANGIOGRAPHY;  Surgeon: Wellington Hampshire, MD;  Location: [facility];  Service: Cardiovascular;  Laterality: N/A;  . TEE WITHOUT CARDIOVERSION N/A  03/19/2018   Procedure: TRANSESOPHAGEAL ECHOCARDIOGRAM (TEE);  Surgeon: Gaye Pollack, MD;  Location: [facility];  Service: Open Heart Surgery;  Laterality: N/A;  . TONSILLECTOMY    . VASECTOMY    . WISDOM TOOTH EXTRACTION      Current Medications: Current Meds  Medication Sig  . albuterol (PROVENTIL HFA;VENTOLIN HFA) 108 (90 Base) MCG/ACT inhaler Inhale 2 puffs into the lungs every 6 (six) hours as needed for wheezing or shortness of breath.  Marland Kitchen amoxicillin (AMOXIL) 500 MG capsule TAKE 4 CAPSULES 1 HOUR PRIOR TO DENTAL APPOINTMENT  . aspirin EC 81 MG tablet Take 1 tablet (81 mg total) by mouth daily.  Marland Kitchen ezetimibe (ZETIA) 10 MG tablet TAKE 1 TABLET BY MOUTH EVERY DAY  . metoprolol tartrate (LOPRESSOR) 25 MG tablet TAKE 1 TABLET BY MOUTH TWICE A DAY  . nitroGLYCERIN (NITROSTAT) 0.4 MG SL tablet Place 1 tablet (0.4 mg total) under the tongue every 5 (five) minutes as needed for chest pain.  Marland Kitchen REPATHA SURECLICK 831 MG/ML SOAJ INJECT 140 MG INTO THE SKIN EVERY 14 DAYS.     Allergies:   Pravastatin   Social History   Socioeconomic History  . Marital status: Married    Spouse name: Not on file  . Number of children: 2  . Years of education: 77  . Highest education level: Not on file  Occupational History  . Occupation: Remodler  Tobacco Use  . Smoking status: Never Smoker  . Smokeless tobacco: Never Used  Vaping Use  . Vaping Use: Never used  Substance and Sexual Activity  . Alcohol use: Yes    Alcohol/week: 18.0 standard drinks    Types: 18 Cans of beer per week    Comment: 03/16/2018 "2-3 beers daily"  . Drug use: Not Currently  . Sexual activity: Yes  Other Topics Concern  . Not on file  Social History Narrative  . Not on file   Social Determinants of Health   Financial Resource Strain:   . Difficulty of Paying Living Expenses: Not on file  Food Insecurity:   . Worried About Charity fundraiser in the Last Year: Not on file  . Ran Out of Food in the Last Year: Not on file   Transportation Needs:   . Lack of Transportation (Medical): Not on file  . Lack of Transportation (Non-Medical): Not on file  Physical Activity:   . Days of Exercise per Week: Not on file  . Minutes of Exercise per Session: Not on file  Stress:   . Feeling of Stress : Not on file  Social Connections:   . Frequency of Communication with Friends and Family: Not on file  . Frequency of Social Gatherings with Friends and Family: Not on file  . Attends Religious Services: Not on file  . Active Member of Clubs or Organizations: Not on file  . Attends Club  or Organization Meetings: Not on file  . Marital Status: Not on file     Family History: The patient's family history includes Autoimmune disease in his mother; Cancer (age of onset: 74) in his paternal uncle; Diabetes in his sister; Gout in his paternal grandfather; Healthy in his mother; Heart attack in his father and paternal grandfather; Mental illness in his maternal grandmother; Stroke in his paternal grandmother. ROS:   Please see the history of present illness.    All other systems reviewed and are negative.  EKGs/Labs/Other Studies Reviewed:    The following studies were reviewed today:  EKG:  EKG ordered today and personally reviewed.  The ekg ordered today demonstrates sinus rhythm left posterior fascicular block otherwise normal  Recent Labs: No results found for requested labs within last 8760 hours.  Recent Lipid Panel    Component Value Date/Time   CHOL 116 01/29/2019 0854   CHOL 178 01/15/2014 0801   TRIG 101 01/29/2019 0854   TRIG 233 (H) 01/15/2014 0801   HDL 70 01/29/2019 0854   HDL 62 01/15/2014 0801   CHOLHDL 1.7 01/29/2019 0854   CHOLHDL 4 11/02/2016 0829   VLDL 13.8 11/02/2016 0829   LDLCALC 27 01/29/2019 0854   LDLCALC 69 01/15/2014 0801   LDLDIRECT 205.1 12/26/2012 0938    Physical Exam:    VS:  BP 126/74   Pulse 74   Ht 6\' 1"  (1.854 m)   Wt 174 lb 1.9 oz (79 kg)   SpO2 95%   BMI 22.97  kg/m     Wt Readings from Last 3 Encounters:  02/12/20 174 lb 1.9 oz (79 kg)  01/29/19 176 lb 6.4 oz (80 kg)  07/24/18 177 lb (80.3 kg)     GEN:  Well nourished, well developed in no acute distress HEENT: Normal NECK: No JVD; No carotid bruits LYMPHATICS: No lymphadenopathy CARDIAC: RRR, no murmurs, rubs, gallops RESPIRATORY:  Clear to auscultation without rales, wheezing or rhonchi  ABDOMEN: Soft, non-tender, non-distended MUSCULOSKELETAL:  No edema; No deformity  SKIN: Warm and dry NEUROLOGIC:  Alert and oriented x 3 PSYCHIATRIC:  Normal affect    Signed, Shirlee More, MD  02/12/2020 1:58 PM    [facility]

## 2020-02-13 ENCOUNTER — Telehealth: Payer: Self-pay

## 2020-02-13 LAB — COMPREHENSIVE METABOLIC PANEL
ALT: 27 IU/L (ref 0–44)
AST: 25 IU/L (ref 0–40)
Albumin/Globulin Ratio: 1.8 (ref 1.2–2.2)
Albumin: 4.4 g/dL (ref 3.8–4.9)
Alkaline Phosphatase: 64 IU/L (ref 44–121)
BUN/Creatinine Ratio: 13 (ref 9–20)
BUN: 13 mg/dL (ref 6–24)
Bilirubin Total: 0.5 mg/dL (ref 0.0–1.2)
CO2: 26 mmol/L (ref 20–29)
Calcium: 9.4 mg/dL (ref 8.7–10.2)
Chloride: 100 mmol/L (ref 96–106)
Creatinine, Ser: 0.99 mg/dL (ref 0.76–1.27)
GFR calc Af Amer: 100 mL/min/{1.73_m2} (ref >59)
GFR calc non Af Amer: 87 mL/min/{1.73_m2} (ref >59)
Globulin, Total: 2.4 g/dL (ref 1.5–4.5)
Glucose: 94 mg/dL (ref 65–99)
Potassium: 4.8 mmol/L (ref 3.5–5.2)
Sodium: 137 mmol/L (ref 134–144)
Total Protein: 6.8 g/dL (ref 6.0–8.5)

## 2020-02-13 LAB — LIPID PANEL
Chol/HDL Ratio: 1.7 ratio (ref 0.0–5.0)
Cholesterol, Total: 123 mg/dL (ref 100–199)
HDL: 72 mg/dL (ref >39)
LDL Chol Calc (NIH): 37 mg/dL (ref 0–99)
Triglycerides: 68 mg/dL (ref 0–149)
VLDL Cholesterol Cal: 14 mg/dL (ref 5–40)

## 2020-02-13 LAB — LIPOPROTEIN A (LPA): Lipoprotein (a): 12.2 nmol/L (ref <75.0)

## 2020-02-13 NOTE — Telephone Encounter (Signed)
Spoke with patient regarding results and recommendation.  Patient verbalizes understanding and is agreeable to plan of care. Advised patient to call back with any issues or concerns.  

## 2020-02-13 NOTE — Telephone Encounter (Signed)
-----   Message from Richardo Priest, MD sent at 02/13/2020  7:52 AM EST ----- Great result labs normal lipids are ideal no change in treatment

## 2020-02-22 ENCOUNTER — Other Ambulatory Visit: Payer: Self-pay | Admitting: Cardiology

## 2020-02-24 NOTE — Telephone Encounter (Signed)
Rx refill sent to pharmacy. 

## 2020-02-25 ENCOUNTER — Ambulatory Visit: Payer: 59 | Admitting: Medical

## 2020-03-02 ENCOUNTER — Ambulatory Visit (INDEPENDENT_AMBULATORY_CARE_PROVIDER_SITE_OTHER): Payer: 59 | Admitting: Medical

## 2020-03-02 ENCOUNTER — Other Ambulatory Visit: Payer: Self-pay

## 2020-03-02 VITALS — BP 134/83 | HR 66 | Resp 18 | Ht 73.0 in | Wt 173.0 lb

## 2020-03-02 DIAGNOSIS — D72829 Elevated white blood cell count, unspecified: Secondary | ICD-10-CM

## 2020-03-02 DIAGNOSIS — Z23 Encounter for immunization: Secondary | ICD-10-CM | POA: Diagnosis not present

## 2020-03-02 DIAGNOSIS — R739 Hyperglycemia, unspecified: Secondary | ICD-10-CM

## 2020-03-02 DIAGNOSIS — Z125 Encounter for screening for malignant neoplasm of prostate: Secondary | ICD-10-CM

## 2020-03-02 DIAGNOSIS — I10 Essential (primary) hypertension: Secondary | ICD-10-CM

## 2020-03-02 DIAGNOSIS — Z1211 Encounter for screening for malignant neoplasm of colon: Secondary | ICD-10-CM | POA: Diagnosis not present

## 2020-03-02 DIAGNOSIS — E785 Hyperlipidemia, unspecified: Secondary | ICD-10-CM

## 2020-03-02 NOTE — Progress Notes (Signed)
Subjective:    Patient ID: Tim Strickland, male    DOB: 13-Oct-1965, 54 y.o.   MRN: [id number]  HPI  Pt in for first time with me.  Pt was with [place] but other office.  Pt works Architect. Pt eats healthy. Moderate alcohol uses. Non smoker.   Hx of htn, high cholesterol, CAD and elevated sugar.   Pt is going to get covid booster vaccine soon.  Pt flu vaccine.  Pt never had colonoscopy.   Pt has history of elevated wbc at time of cabbage in 2020.  Sugar was elevated. Pt never had psa.    Review of Systems  Constitutional: Negative for chills, fatigue and fever.  HENT: Negative for congestion and drooling.   Respiratory: Negative for cough, chest tightness, shortness of breath and wheezing.   Cardiovascular: Negative for chest pain and palpitations.  Gastrointestinal: Negative for abdominal pain, constipation, diarrhea, nausea and vomiting.  Genitourinary: Negative for dysuria and flank pain.  Musculoskeletal: Negative for back pain, joint swelling and neck pain.  Skin: Negative for rash.  Neurological: Negative for dizziness, weakness, numbness and headaches.  Hematological: Negative for adenopathy. Does not bruise/bleed easily.  Psychiatric/Behavioral: Negative for behavioral problems and confusion.     Past Medical History:  Diagnosis Date  . Asthma 12/05/2008   Qualifier: Diagnosis of  By: Linna Darner MD, William   Onset @ age 18 after environmental exposures (home built in 16s). No  maintenance agents; Rx: avoidance of triggers (paint fumes, dust) Singulair & albuterol prn    . Bursitis of right shoulder 01/15/2014   Ultrasound guided injection 01/15/2014   . CAD (coronary artery disease), native coronary artery 01/25/2018  . Coronary artery disease   . Elevated liver enzymes    PMH of  on Pravachol  . Encounter for general adult medical examination with abnormal findings 11/04/2016  . Environmental asthma   . Familial hyperlipidemia 12/05/2008   Qualifier:  Diagnosis of  By: Linna Darner MD, Cristopher Estimable Lipoprofile 2006: LDL 162 ( 1985/ 1351), HDL 51, TG 108.  NMR Lipoprofile 01/2014 : LDL 69 ( 885 / 601  ),  HDL 62 , Triglycerides 233 . LP-IR 62. LDL goal= < 110, ideally < 80. Father MI @ before 69; PGF MI in 52s. PGM CVA in 60s Note: previously on Pravastatin 20 mg ; stopped because  mild hepatic enzyme rise. [facility] Heart Panel 01/2011: NOT h  . HTN (hypertension) 03/18/2018  . Hyperglycemia    A1c 5.2% in 2010  . Hyperlipidemia   . Hypertension   . Keloid of skin 07/24/2018  . Left wrist tendinitis 02/23/2016  . Meralgia paraesthetica, left 04/19/2018  . Paresthesia 07/24/2018  . Right rotator cuff tear 04/26/2016   Injected A 17 2018.  . S/P CABG x 5    RIMA to LAD LIMA to Diagonal 2 SVG to Diagonal 1 Sequential SVG to PD and PLB   . Shoulder impingement syndrome, right 02/23/2016  . Synovitis of toe 02/23/2016     Social History   Socioeconomic History  . Marital status: Married    Spouse name: Not on file  . Number of children: 2  . Years of education: 60  . Highest education level: Not on file  Occupational History  . Occupation: Remodler  Tobacco Use  . Smoking status: Never Smoker  . Smokeless tobacco: Never Used  Vaping Use  . Vaping Use: Never used  Substance and Sexual Activity  . Alcohol use: Yes    Alcohol/week:  18.0 standard drinks    Types: 18 Cans of beer per week    Comment: 03/16/2018 "2-3 beers daily"  . Drug use: Not Currently  . Sexual activity: Yes  Other Topics Concern  . Not on file  Social History Narrative  . Not on file   Social Determinants of Health   Financial Resource Strain: Not on file  Food Insecurity: Not on file  Transportation Needs: Not on file  Physical Activity: Not on file  Stress: Not on file  Social Connections: Not on file  Intimate Partner Violence: Not on file    Past Surgical History:  Procedure Laterality Date  . APPENDECTOMY    . CORONARY ARTERY BYPASS GRAFT N/A 03/19/2018    Procedure: CORONARY ARTERY BYPASS GRAFTING (CABG), ON PUMP, TIMES FIVE, USING BILATERAL INTERNAL MAMMARY ARTERIES AND ENDOSCOPICALLY HARVESTED RIGHT GREATER SAPHENOUS VEIN;  Surgeon: Alleen Borne, MD;  Location: [facility];  Service: Open Heart Surgery;  Laterality: N/A;  . LEFT HEART CATH AND CORONARY ANGIOGRAPHY N/A 03/16/2018   Procedure: LEFT HEART CATH AND CORONARY ANGIOGRAPHY;  Surgeon: Iran Ouch, MD;  Location: [facility];  Service: Cardiovascular;  Laterality: N/A;  . TEE WITHOUT CARDIOVERSION N/A 03/19/2018   Procedure: TRANSESOPHAGEAL ECHOCARDIOGRAM (TEE);  Surgeon: Alleen Borne, MD;  Location: [facility];  Service: Open Heart Surgery;  Laterality: N/A;  . TONSILLECTOMY    . VASECTOMY    . WISDOM TOOTH EXTRACTION      Family History  Problem Relation Age of Onset  . Heart attack Father        pre 55  . Diabetes Sister        DM 1  . Heart attack Paternal Grandfather         in 82s  . Gout Paternal Grandfather   . Cancer Paternal Uncle 41       bone   . Mental illness Maternal Grandmother        "breakdown"  . Healthy Mother   . Autoimmune disease Mother   . Stroke Paternal Grandmother     Allergies  Allergen Reactions  . Pravastatin Other (See Comments)    04/03/2009: AST 51 and ALT 101 on pravastatin 20 mg daily. LDL at that time was 192.6.    Current Outpatient Medications on File Prior to Visit  Medication Sig Dispense Refill  . albuterol (PROVENTIL HFA;VENTOLIN HFA) 108 (90 Base) MCG/ACT inhaler Inhale 2 puffs into the lungs every 6 (six) hours as needed for wheezing or shortness of breath. 3 Inhaler 0  . aspirin EC 81 MG tablet Take 1 tablet (81 mg total) by mouth daily. 90 tablet 3  . ezetimibe (ZETIA) 10 MG tablet TAKE 1 TABLET BY MOUTH EVERY DAY 90 tablet 1  . metoprolol tartrate (LOPRESSOR) 25 MG tablet TAKE 1 TABLET BY MOUTH TWICE A DAY 180 tablet 1  . REPATHA SURECLICK 140 MG/ML SOAJ INJECT 140 MG INTO THE SKIN EVERY 14 DAYS. 2 mL 11  .  amoxicillin (AMOXIL) 500 MG capsule TAKE 4 CAPSULES 1 HOUR PRIOR TO DENTAL APPOINTMENT (Patient not taking: Reported on 03/02/2020)    . nitroGLYCERIN (NITROSTAT) 0.4 MG SL tablet Place 1 tablet (0.4 mg total) under the tongue every 5 (five) minutes as needed for chest pain. 25 tablet 11   No current facility-administered medications on file prior to visit.    BP 134/83   Pulse 66   Resp 18   Ht 6\' 1"  (1.854 m)   Wt 173  lb (78.5 kg)   SpO2 97%   BMI 22.82 kg/m       Objective:   Physical Exam  General  Mental Status- Alert. General Appearance- Not in acute distress.    Neck Carotid Arteries- Normal color. Moisture- Normal Moisture. No carotid bruits. No JVD.  Chest and Lung Exam Auscultation: Breath Sounds:-Normal.  Cardiovascular Auscultation:Rythm- Regular. Murmurs & Other Heart Sounds:Auscultation of the heart reveals- No Murmurs.  Abdomen Inspection:-Inspeection Normal. Palpation/Percussion:Note:No mass. Palpation and Percussion of the abdomen reveal- Non Tender, Non Distended + BS, no rebound or guarding.   Neurologic Cranial Nerve exam:- CN III-XII intact(No nystagmus), symmetric smile. Strength:- 5/5 equal and symmetric strength both upper and lower extremities.      Assessment & Plan:  History of hypertension blood pressure well controlled.  Continue metoprolol.  History of hyperlipidemia and coronary artery disease.  Repatha appears to be controlling cholesterol levels very well.  Continue to follow-up with cardiologist as regularly scheduled.  Elevated sugar in the past and will put future A1c check blood sugar averages.  On review of chart history of elevated infection fighting cells.  That was during the time of  CABG. do think it is a good idea to repeat CBC to follow WBC trend.  Placed future screening PSA.  Referral to gastroenterologist for screening colonoscopy.  Follow-up date to be determined after lab review.  Typical follow-up for about  every 6 months.  Mackie Pai, PA-C

## 2020-03-02 NOTE — Patient Instructions (Signed)
History of hypertension blood pressure well controlled.  Continue metoprolol.  History of hyperlipidemia and coronary artery disease.  Repatha appears to be controlling cholesterol levels very well.  Continue to follow-up with cardiologist as regularly scheduled.  Elevated sugar in the past and will put future A1c check blood sugar averages.  On review of chart history of elevated infection fighting cells.  That was during the time of  CABG. do think it is a good idea to repeat CBC to follow WBC trend.  Placed future screening PSA.  Referral to gastroenterologist for screening colonoscopy.  Follow-up date to be determined after lab review.  Typical follow-up for about every 6 months.

## 2020-04-28 ENCOUNTER — Other Ambulatory Visit: Payer: Self-pay

## 2020-04-28 ENCOUNTER — Ambulatory Visit (INDEPENDENT_AMBULATORY_CARE_PROVIDER_SITE_OTHER): Payer: 59 | Admitting: Cardiology

## 2020-04-28 VITALS — BP 128/96 | HR 64 | Ht 73.0 in | Wt 179.0 lb

## 2020-04-28 DIAGNOSIS — I2510 Atherosclerotic heart disease of native coronary artery without angina pectoris: Secondary | ICD-10-CM | POA: Diagnosis not present

## 2020-04-28 DIAGNOSIS — E7849 Other hyperlipidemia: Secondary | ICD-10-CM | POA: Diagnosis not present

## 2020-04-28 DIAGNOSIS — I10 Essential (primary) hypertension: Secondary | ICD-10-CM | POA: Diagnosis not present

## 2020-04-28 MED ORDER — TELMISARTAN 20 MG PO TABS: 20.0000 mg | ORAL_TABLET | Freq: Every day | ORAL | 3 refills | Status: DC

## 2020-04-28 NOTE — Patient Instructions (Signed)
Medication Instructions:  Your physician has recommended you make the following change in your medication:  START: Micardis 20 mg take one tablet by mouth daily.  *If you need a refill on your cardiac medications before your next appointment, please call your pharmacy*   Lab Work: Your physician recommends that you return for lab work in: 2 weeks BMP If you have labs (blood work) drawn today and your tests are completely normal, you will receive your results only by: Marland Kitchen MyChart Message (if you have MyChart) OR . A paper copy in the mail If you have any lab test that is abnormal or we need to change your treatment, we will call you to review the results.   Testing/Procedures:   [facility] Cardiovascular Imaging at [facility] [address] [place], [place] [address] Phone: [phone]    Please arrive 15 minutes prior to your appointment time for registration and insurance purposes.  The test will take approximately 3 to 4 hours to complete; you may bring reading material.  If someone comes with you to your appointment, they will need to remain in the main lobby due to limited space in the testing area. **If you are pregnant or breastfeeding, please notify the nuclear lab prior to your appointment**  How to prepare for your Myocardial Perfusion Test: . Do not eat or drink 3 hours prior to your test, except you may have water. . Do not consume products containing caffeine (regular or decaffeinated) 12 hours prior to your test. (ex: coffee, chocolate, sodas, tea). . Do bring a list of your current medications with you.  If not listed below, you may take your medications as normal. . Do wear comfortable clothes (no dresses or overalls) and walking shoes, tennis shoes preferred (No heels or open toe shoes are allowed). . Do NOT wear cologne, perfume, aftershave, or lotions (deodorant is allowed). . If these instructions are not followed, your test will have to be  rescheduled.  Please report to [address] for your test.  If you have questions or concerns about your appointment, you can call the Nuclear Lab at [phone].  If you cannot keep your appointment, please provide 24 hours notification to the Nuclear Lab, to avoid a possible $50 charge to your account.    Follow-Up: At [facility], you and your health needs are our priority.  As part of our continuing mission to provide you with exceptional heart care, we have created designated Provider Care Teams.  These Care Teams include your primary Cardiologist (physician) and Advanced Practice Providers (APPs -  Physician Assistants and Nurse Practitioners) who all work together to provide you with the care you need, when you need it.  We recommend signing up for the patient portal called "MyChart".  Sign up information is provided on this After Visit Summary.  MyChart is used to connect with patients for Virtual Visits (Telemedicine).  Patients are able to view lab/test results, encounter notes, upcoming appointments, etc.  Non-urgent messages can be sent to your provider as well.   To learn more about what you can do with MyChart, go to [email or web address].    Your next appointment:   4 week(s)  The format for your next appointment:   In Person  Provider:   Shirlee More, MD   Other Instructions

## 2020-04-28 NOTE — Progress Notes (Signed)
Cardiology Office Note:    Date:  04/28/2020   ID:  Tim Strickland, DOB [birth year], MRN [id number]  PCP:  Mackie Pai, PA-C  Cardiologist:  Shirlee More, MD    Referring MD: Mackie Pai, PA-C    ASSESSMENT:    1. Coronary artery disease involving native coronary artery of native heart without angina pectoris   2. Familial hyperlipidemia   3. Essential hypertension   4. Hypertension, unspecified type    PLAN:    In order of problems listed above:  1. Symptoms are not typical angina but disturbing somewhat the same pattern he had prior to diagnosis and revascularization we will check a stress Myoview and if he has significant ischemia high risk markers would benefit from coronary angiography although I think it is unlikely he has early graft failure. 2. Continue current lipid-lowering therapy LDL is ideal 3. Not well controlled add ARB 2 weeks check renal function trend blood pressures and bring it with him in 2 weeks for evaluation follow-up in 4 weeks in the office   Next appointment: 1 month   Medication Adjustments/Labs and Tests Ordered: Current medicines are reviewed at length with the patient today.  Concerns regarding medicines are outlined above.  Orders Placed This Encounter  Procedures  . Basic metabolic panel  . MYOCARDIAL PERFUSION IMAGING  . EKG 12-Lead   Meds ordered this encounter  Medications  . telmisartan (MICARDIS) 20 MG tablet    Sig: Take 1 tablet (20 mg total) by mouth daily.    Dispense:  90 tablet    Refill:  3    No chief complaint on file.   History of Present Illness:    Tim Strickland is a 55 y.o. male with a hx of CAD with CABG January 2020 hypertension and familial hyperlipidemia last seen 02/12/2020.  He is seen today as a work in his had some chest discomfort associated the stress of his father's death..  Compliance with diet, lifestyle and medications: Yes  He finds himself unsure he has had some aching in his shoulders  not exertional unlikely to be anginal numbness and tingling in his hands and has had some throbbing with systolic blood pressure greater than 90.  He is not having typical exertional angina shortness of breath palpitation or syncope.  We discussed options his EKG is normal he will undergo stress Myoview if he had high risk markers he benefit from repeat coronary angiography.  If this is unrevealing he may benefit from CT spine x-ray for disc disease and nerve conduction studies.  I will place him on an ARB for hypertension along with his current medications and check a BMP in 2 weeks no trend blood pressures at home and follow-up in 1 month.  I reviewed his coronary for report and bypass surgery note on 03/19/2018 with a left internal mammary artery anastomosis second diagonal right internal mammary artery to the LAD vein graft to the first diagonal and vein graft to the PDA and posterior lateral branch.  He has significant two-vessel disease involving the LAD and right coronary artery.  He was completely revascularized. Past Medical History:  Diagnosis Date  . Asthma 12/05/2008   Qualifier: Diagnosis of  By: Linna Darner MD, William   Onset @ age 57 after environmental exposures (home built in 81s). No  maintenance agents; Rx: avoidance of triggers (paint fumes, dust) Singulair & albuterol prn    . Bursitis of right shoulder 01/15/2014   Ultrasound guided injection 01/15/2014   .  CAD (coronary artery disease), native coronary artery 01/25/2018  . Coronary artery disease   . Elevated liver enzymes    PMH of  on Pravachol  . Encounter for general adult medical examination with abnormal findings 11/04/2016  . Environmental asthma   . Familial hyperlipidemia 12/05/2008   Qualifier: Diagnosis of  By: Linna Darner MD, Cristopher Estimable Lipoprofile 2006: LDL 162 ( 1985/ 1351), HDL 51, TG 108.  NMR Lipoprofile 01/2014 : LDL 69 ( 885 / 601  ),  HDL 62 , Triglycerides 233 . LP-IR 62. LDL goal= < 110, ideally < 80. Father MI @  before 49; PGF MI in 65s. PGM CVA in 60s Note: previously on Pravastatin 20 mg ; stopped because  mild hepatic enzyme rise. [facility] Panel 01/2011: NOT h  . HTN (hypertension) 03/18/2018  . Hyperglycemia    A1c 5.2% in 2010  . Hyperlipidemia   . Hypertension   . Keloid of skin 07/24/2018  . Left wrist tendinitis 02/23/2016  . Meralgia paraesthetica, left 04/19/2018  . Paresthesia 07/24/2018  . Right rotator cuff tear 04/26/2016   Injected A 17 2018.  . S/P CABG x 5    RIMA to LAD LIMA to Diagonal 2 SVG to Diagonal 1 Sequential SVG to PD and PLB   . Shoulder impingement syndrome, right 02/23/2016  . Synovitis of toe 02/23/2016    Past Surgical History:  Procedure Laterality Date  . APPENDECTOMY    . CORONARY ARTERY BYPASS GRAFT N/A 03/19/2018   Procedure: CORONARY ARTERY BYPASS GRAFTING (CABG), ON PUMP, TIMES FIVE, USING BILATERAL INTERNAL MAMMARY ARTERIES AND ENDOSCOPICALLY HARVESTED RIGHT GREATER SAPHENOUS VEIN;  Surgeon: Gaye Pollack, MD;  Location: [facility];  Service: Open Heart Surgery;  Laterality: N/A;  . LEFT HEART CATH AND CORONARY ANGIOGRAPHY N/A 03/16/2018   Procedure: LEFT HEART CATH AND CORONARY ANGIOGRAPHY;  Surgeon: Wellington Hampshire, MD;  Location: [facility];  Service: Cardiovascular;  Laterality: N/A;  . TEE WITHOUT CARDIOVERSION N/A 03/19/2018   Procedure: TRANSESOPHAGEAL ECHOCARDIOGRAM (TEE);  Surgeon: Gaye Pollack, MD;  Location: [facility];  Service: Open Heart Surgery;  Laterality: N/A;  . TONSILLECTOMY    . VASECTOMY    . WISDOM TOOTH EXTRACTION      Current Medications: Current Meds  Medication Sig  . albuterol (PROVENTIL HFA;VENTOLIN HFA) 108 (90 Base) MCG/ACT inhaler Inhale 2 puffs into the lungs every 6 (six) hours as needed for wheezing or shortness of breath.  Marland Kitchen amoxicillin (AMOXIL) 500 MG capsule   . aspirin EC 81 MG tablet Take 1 tablet (81 mg total) by mouth daily.  Marland Kitchen ezetimibe (ZETIA) 10 MG tablet TAKE 1 TABLET BY MOUTH EVERY DAY  . metoprolol  tartrate (LOPRESSOR) 25 MG tablet TAKE 1 TABLET BY MOUTH TWICE A DAY  . REPATHA SURECLICK 570 MG/ML SOAJ INJECT 140 MG INTO THE SKIN EVERY 14 DAYS.  Marland Kitchen telmisartan (MICARDIS) 20 MG tablet Take 1 tablet (20 mg total) by mouth daily.     Allergies:   Pravastatin   Social History   Socioeconomic History  . Marital status: Married    Spouse name: Not on file  . Number of children: 2  . Years of education: 24  . Highest education level: Not on file  Occupational History  . Occupation: Remodler  Tobacco Use  . Smoking status: Never Smoker  . Smokeless tobacco: Never Used  Vaping Use  . Vaping Use: Never used  Substance and Sexual Activity  . Alcohol use: Yes  Alcohol/week: 18.0 standard drinks    Types: 18 Cans of beer per week    Comment: 03/16/2018 "2-3 beers daily"  . Drug use: Not Currently  . Sexual activity: Yes  Other Topics Concern  . Not on file  Social History Narrative  . Not on file   Social Determinants of Health   Financial Resource Strain: Not on file  Food Insecurity: Not on file  Transportation Needs: Not on file  Physical Activity: Not on file  Stress: Not on file  Social Connections: Not on file     Family History: The patient's family history includes Autoimmune disease in his mother; Cancer (age of onset: 39) in his paternal uncle; Diabetes in his sister; Gout in his paternal grandfather; Healthy in his mother; Heart attack in his father and paternal grandfather; Mental illness in his maternal grandmother; Stroke in his paternal grandmother. ROS:   Please see the history of present illness.    All other systems reviewed and are negative.  EKGs/Labs/Other Studies Reviewed:    The following studies were reviewed today:  EKG:  EKG ordered today and personally reviewed.  The ekg ordered today demonstrates sinus rhythm normal EKG rightward QRS axis  Recent Labs: 02/12/2020: ALT 27; BUN 13; Creatinine, Ser 0.99; Potassium 4.8; Sodium 137  Recent Lipid  Panel    Component Value Date/Time   CHOL 123 02/12/2020 1359   CHOL 178 01/15/2014 0801   TRIG 68 02/12/2020 1359   TRIG 233 (H) 01/15/2014 0801   HDL 72 02/12/2020 1359   HDL 62 01/15/2014 0801   CHOLHDL 1.7 02/12/2020 1359   CHOLHDL 4 11/02/2016 0829   VLDL 13.8 11/02/2016 0829   LDLCALC 37 02/12/2020 1359   LDLCALC 69 01/15/2014 0801   LDLDIRECT 205.1 12/26/2012 0938    Physical Exam:    VS:  BP (!) 128/96   Pulse 64   Ht 6\' 1"  (1.854 m)   Wt 179 lb (81.2 kg)   SpO2 96%   BMI 23.62 kg/m     Wt Readings from Last 3 Encounters:  04/28/20 179 lb (81.2 kg)  03/02/20 173 lb (78.5 kg)  02/12/20 174 lb 1.9 oz (79 kg)     GEN:  Well nourished, well developed in no acute distress HEENT: Normal NECK: No JVD; No carotid bruits LYMPHATICS: No lymphadenopathy CARDIAC: RRR, no murmurs, rubs, gallops RESPIRATORY:  Clear to auscultation without rales, wheezing or rhonchi  ABDOMEN: Soft, non-tender, non-distended MUSCULOSKELETAL:  No edema; No deformity  SKIN: Warm and dry NEUROLOGIC:  Alert and oriented x 3 PSYCHIATRIC:  Normal affect    Signed, Shirlee More, MD  04/28/2020 9:43 AM    [facility]

## 2020-04-29 ENCOUNTER — Telehealth: Payer: Self-pay | Admitting: *Deleted

## 2020-04-29 ENCOUNTER — Encounter: Payer: Self-pay | Admitting: Gastroenterology

## 2020-04-29 NOTE — Telephone Encounter (Signed)
Spoke with patient concerning up-coming perfusion scan as follow up to chest pain. Will call patient after his 06/01/20 f/u to see if he will require further cardiac work-up or if it's ok to re-schedule colonoscopy at that time. [phone]

## 2020-05-01 NOTE — Addendum Note (Signed)
Addended byShirlee More on: 05/01/2020 12:06 PM   Modules accepted: Orders

## 2020-05-05 ENCOUNTER — Other Ambulatory Visit (HOSPITAL_COMMUNITY)
Admission: RE | Admit: 2020-05-05 | Discharge: 2020-05-05 | Disposition: A | Discharge status: Home / Self Care | Payer: 59 | Source: Ambulatory Visit | Attending: Cardiology | Admitting: Cardiology

## 2020-05-05 ENCOUNTER — Other Ambulatory Visit (HOSPITAL_COMMUNITY): Payer: 59

## 2020-05-05 DIAGNOSIS — Z20822 Contact with and (suspected) exposure to covid-19: Secondary | ICD-10-CM | POA: Insufficient documentation

## 2020-05-05 DIAGNOSIS — Z01812 Encounter for preprocedural laboratory examination: Secondary | ICD-10-CM | POA: Insufficient documentation

## 2020-05-05 LAB — SARS CORONAVIRUS 2 (TAT 6-24 HRS): SARS Coronavirus 2: NEGATIVE

## 2020-05-06 ENCOUNTER — Telehealth (HOSPITAL_COMMUNITY): Payer: Self-pay | Admitting: *Deleted

## 2020-05-06 NOTE — Telephone Encounter (Signed)
Left message on voicemail per DPR in reference to upcoming appointment scheduled on 05/08/20 at 0730 with detailed instructions given per Myocardial Perfusion Study Information Sheet for the test. LM to arrive 15 minutes early, and that it is imperative to arrive on time for appointment to keep from having the test rescheduled. If you need to cancel or reschedule your appointment, please call the office within 24 hours of your appointment. Failure to do so may result in a cancellation of your appointment, and a $50 no show fee. Phone number given for call back for any questions. Cadden Elizondo, [place]e [place]

## 2020-05-07 ENCOUNTER — Encounter (HOSPITAL_COMMUNITY): Payer: 59

## 2020-05-08 ENCOUNTER — Ambulatory Visit (HOSPITAL_COMMUNITY): Payer: 59 | Attending: Cardiovascular Disease

## 2020-05-08 ENCOUNTER — Other Ambulatory Visit (HOSPITAL_COMMUNITY): Payer: 59

## 2020-05-08 ENCOUNTER — Other Ambulatory Visit: Payer: Self-pay

## 2020-05-08 VITALS — Ht 73.0 in | Wt 179.0 lb

## 2020-05-08 DIAGNOSIS — Z951 Presence of aortocoronary bypass graft: Secondary | ICD-10-CM | POA: Diagnosis present

## 2020-05-08 DIAGNOSIS — I2510 Atherosclerotic heart disease of native coronary artery without angina pectoris: Secondary | ICD-10-CM | POA: Insufficient documentation

## 2020-05-08 LAB — MYOCARDIAL PERFUSION IMAGING
Estimated workload: 12.1 METS
Exercise duration (min): 10 min
Exercise duration (sec): 16 s
LV dias vol: 84 mL (ref 62–150)
LV sys vol: 35 mL
MPHR: 166 {beats}/min
Peak HR: 153 {beats}/min
Percent HR: 92 %
Rest HR: 62 {beats}/min
SDS: 3
SRS: 0
SSS: 3
TID: 0.89

## 2020-05-08 MED ORDER — TECHNETIUM TC 99M TETROFOSMIN IV KIT
30.9000 | PACK | Freq: Once | INTRAVENOUS | Status: AC | PRN
  Administered 2020-05-08: 30.9 via INTRAVENOUS
  Filled 2020-05-08: qty 31

## 2020-05-08 MED ORDER — TECHNETIUM TC 99M TETROFOSMIN IV KIT
9.7000 | PACK | Freq: Once | INTRAVENOUS | Status: AC | PRN
  Administered 2020-05-08: 9.7 via INTRAVENOUS
  Filled 2020-05-08: qty 10

## 2020-05-11 ENCOUNTER — Telehealth: Payer: Self-pay | Admitting: Cardiology

## 2020-05-11 ENCOUNTER — Telehealth: Payer: Self-pay

## 2020-05-11 NOTE — Telephone Encounter (Signed)
-----   Message from Richardo Priest, MD sent at 05/09/2020  2:30 PM EST ----- Good result this is normal I hope this is helpful and reassuring for him

## 2020-05-11 NOTE — Telephone Encounter (Signed)
Spoke with patient regarding results and recommendation.  Patient verbalizes understanding and is agreeable to plan of care. Advised patient to call back with any issues or concerns.  

## 2020-05-11 NOTE — Telephone Encounter (Signed)
Follow Up:     Pt is returning your call from today, concerning his results.

## 2020-05-11 NOTE — Telephone Encounter (Signed)
Left message on patients voicemail to please return our call.   

## 2020-05-12 ENCOUNTER — Encounter (HOSPITAL_COMMUNITY): Payer: 59

## 2020-05-20 ENCOUNTER — Encounter: Payer: 59 | Admitting: Internal Medicine

## 2020-05-31 NOTE — Progress Notes (Signed)
Cardiology Office Note:    Date:  06/01/2020   ID:  Tim Strickland, DOB [birth year], MRN [id number]  PCP:  Mackie Pai, PA-C  Cardiologist:  Shirlee More, MD    Referring MD: Mackie Pai, PA-C    ASSESSMENT:    1. Coronary artery disease involving native coronary artery of native heart without angina pectoris   2. Familial hypercholesterolemia   3. Hypertension, unspecified type    PLAN:    In order of problems listed above:  1. Stable and I are reassured by normal myocardial perfusion study after bypass surgery and continue therapy aspirin beta-blocker antihypertensives and combined lipid-lowering therapy he is asking inhibitor Repatha and Zetia lipids are ideal 2. BP controlled continue ARB beta-blocker   Next appointment: 1 year we will recheck his labs in June.  CMP lipid profile   Medication Adjustments/Labs and Tests Ordered: Current medicines are reviewed at length with the patient today.  Concerns regarding medicines are outlined above.  Orders Placed This Encounter  Procedures  . Comprehensive metabolic panel  . Lipid panel  . Lipoprotein A (LPA)   Meds ordered this encounter  Medications  . nitroGLYCERIN (NITROSTAT) 0.4 MG SL tablet    Sig: Place 1 tablet (0.4 mg total) under the tongue every 5 (five) minutes as needed for chest pain.    Dispense:  25 tablet    Refill:  3    Chief Complaint  Patient presents with  . Follow-up  . Coronary Artery Disease    He had recent stress Myoview study performed in a history of bypass surgery January 2020.    History of Present Illness:    Tim Strickland is a 55 y.o. male with a hx of CAD CABG January 2020 hypertension and familial hyperlipidemia last seen 02/10/2020.  Compliance with diet, lifestyle and medications: Yes  I reviewed his Myoview study with the patient myocardial perfusion function EKG response and exercise tolerance are all normal. He gets a little aching at times in the left chest not  anginal in nature no shortness of breath palpitation or syncope. He is reassured. His lipids are ideal with PCSK9 therapy.  Gated SPECT 05/08/2020: Study Highlights   The left ventricular ejection fraction is normal (55-65%).  Nuclear stress EF: 58%.  Blood pressure demonstrated a hypertensive response to exercise.  There was no ST segment deviation noted during stress.  The study is normal.  This is a low risk study. Stress Findings  ECG Baseline ECG exhibits normal sinus rhythm..  Stress Findings The patient exercised following the Bruce protocol.   The patient reported dyspnea during the stress test. The patient experienced no angina during the stress test.   The test was stopped because  the patient complained of fatigue and shortness of breath.   Heart rate demonstrated a normal response to exercise. Blood pressure demonstrated a hypertensive response to exercise. Overall, the patient's exercise capacity was normal.   The patient's response to exercise was adequate for diagnosis.  Response to Stress There was no ST segment deviation noted during stress.  Arrhythmias during stress:  rare PVCs.   Arrhythmias were not significant.   ECG was interpretable and there was no significant change from baseline.    Stress Measurements  Baseline Vitals  Rest HR 62 bpm    Rest BP 118/85 mmHg    Exercise Time  Exercise duration (min) 10 min    Exercise duration (sec) 16 sec    Peak Stress Vitals  Peak HR 153  bpm    Peak BP 181/93 mmHg    Exercise Data  MPHR 166 bpm    Percent HR 92 %    Estimated workload 12.1 METS        Low risk stress nuclear study with normal perfusion and normal left ventricular regional and global systolic function.  Past Medical History:  Diagnosis Date  . Asthma 12/05/2008   Qualifier: Diagnosis of  By: Linna Darner MD, William   Onset @ age 62 after environmental exposures (home built in 48s). No  maintenance agents; Rx: avoidance of  triggers (paint fumes, dust) Singulair & albuterol prn    . Bursitis of right shoulder 01/15/2014   Ultrasound guided injection 01/15/2014   . CAD (coronary artery disease), native coronary artery 01/25/2018  . Coronary artery disease   . Elevated liver enzymes    PMH of  on Pravachol  . Encounter for general adult medical examination with abnormal findings 11/04/2016  . Environmental asthma   . Familial hyperlipidemia 12/05/2008   Qualifier: Diagnosis of  By: Linna Darner MD, Cristopher Estimable Lipoprofile 2006: LDL 162 ( 1985/ 1351), HDL 51, TG 108.  NMR Lipoprofile 01/2014 : LDL 69 ( 885 / 601  ),  HDL 62 , Triglycerides 233 . LP-IR 62. LDL goal= < 110, ideally < 80. Father MI @ before 55; PGF MI in 63s. PGM CVA in 60s Note: previously on Pravastatin 20 mg ; stopped because  mild hepatic enzyme rise. [facility] Panel 01/2011: NOT h  . HTN (hypertension) 03/18/2018  . Hyperglycemia    A1c 5.2% in 2010  . Hyperlipidemia   . Hypertension   . Keloid of skin 07/24/2018  . Left wrist tendinitis 02/23/2016  . Meralgia paraesthetica, left 04/19/2018  . Paresthesia 07/24/2018  . Right rotator cuff tear 04/26/2016   Injected A 17 2018.  . S/P CABG x 5    RIMA to LAD LIMA to Diagonal 2 SVG to Diagonal 1 Sequential SVG to PD and PLB   . Shoulder impingement syndrome, right 02/23/2016  . Synovitis of toe 02/23/2016    Past Surgical History:  Procedure Laterality Date  . APPENDECTOMY    . CORONARY ARTERY BYPASS GRAFT N/A 03/19/2018   Procedure: CORONARY ARTERY BYPASS GRAFTING (CABG), ON PUMP, TIMES FIVE, USING BILATERAL INTERNAL MAMMARY ARTERIES AND ENDOSCOPICALLY HARVESTED RIGHT GREATER SAPHENOUS VEIN;  Surgeon: Gaye Pollack, MD;  Location: [facility];  Service: Open Heart Surgery;  Laterality: N/A;  . LEFT HEART CATH AND CORONARY ANGIOGRAPHY N/A 03/16/2018   Procedure: LEFT HEART CATH AND CORONARY ANGIOGRAPHY;  Surgeon: Wellington Hampshire, MD;  Location: [facility];  Service: Cardiovascular;  Laterality:  N/A;  . TEE WITHOUT CARDIOVERSION N/A 03/19/2018   Procedure: TRANSESOPHAGEAL ECHOCARDIOGRAM (TEE);  Surgeon: Gaye Pollack, MD;  Location: [facility];  Service: Open Heart Surgery;  Laterality: N/A;  . TONSILLECTOMY    . VASECTOMY    . WISDOM TOOTH EXTRACTION      Current Medications: Current Meds  Medication Sig  . albuterol (PROVENTIL HFA;VENTOLIN HFA) 108 (90 Base) MCG/ACT inhaler Inhale 2 puffs into the lungs every 6 (six) hours as needed for wheezing or shortness of breath.  Marland Kitchen amoxicillin (AMOXIL) 500 MG capsule   . aspirin EC 81 MG tablet Take 1 tablet (81 mg total) by mouth daily.  Marland Kitchen ezetimibe (ZETIA) 10 MG tablet TAKE 1 TABLET BY MOUTH EVERY DAY  . metoprolol tartrate (LOPRESSOR) 25 MG tablet TAKE 1 TABLET BY MOUTH TWICE A DAY  .  REPATHA SURECLICK 962 MG/ML SOAJ INJECT 140 MG INTO THE SKIN EVERY 14 DAYS.  Marland Kitchen telmisartan (MICARDIS) 20 MG tablet Take 1 tablet (20 mg total) by mouth daily.  . [DISCONTINUED] nitroGLYCERIN (NITROSTAT) 0.4 MG SL tablet Place 1 tablet (0.4 mg total) under the tongue every 5 (five) minutes as needed for chest pain.     Allergies:   Pravastatin   Social History   Socioeconomic History  . Marital status: Married    Spouse name: Not on file  . Number of children: 2  . Years of education: 64  . Highest education level: Not on file  Occupational History  . Occupation: Remodler  Tobacco Use  . Smoking status: Never Smoker  . Smokeless tobacco: Never Used  Vaping Use  . Vaping Use: Never used  Substance and Sexual Activity  . Alcohol use: Yes    Alcohol/week: 18.0 standard drinks    Types: 18 Cans of beer per week    Comment: 03/16/2018 "2-3 beers daily"  . Drug use: Not Currently  . Sexual activity: Yes  Other Topics Concern  . Not on file  Social History Narrative  . Not on file   Social Determinants of Health   Financial Resource Strain: Not on file  Food Insecurity: Not on file  Transportation Needs: Not on file  Physical Activity: Not  on file  Stress: Not on file  Social Connections: Not on file     Family History: The patient's family history includes Autoimmune disease in his mother; Cancer (age of onset: 51) in his paternal uncle; Diabetes in his sister; Gout in his paternal grandfather; Healthy in his mother; Heart attack in his father and paternal grandfather; Mental illness in his maternal grandmother; Stroke in his paternal grandmother. ROS:   Please see the history of present illness.    All other systems reviewed and are negative.  EKGs/Labs/Other Studies Reviewed:    The following studies were reviewed today:  Recent Labs: 02/12/2020: ALT 27; BUN 13; Creatinine, Ser 0.99; Potassium 4.8; Sodium 137  Recent Lipid Panel    Component Value Date/Time   CHOL 123 02/12/2020 1359   CHOL 178 01/15/2014 0801   TRIG 68 02/12/2020 1359   TRIG 233 (H) 01/15/2014 0801   HDL 72 02/12/2020 1359   HDL 62 01/15/2014 0801   CHOLHDL 1.7 02/12/2020 1359   CHOLHDL 4 11/02/2016 0829   VLDL 13.8 11/02/2016 0829   LDLCALC 37 02/12/2020 1359   LDLCALC 69 01/15/2014 0801   LDLDIRECT 205.1 12/26/2012 0938    Physical Exam:    VS:  BP 124/84   Pulse 64   Ht 6\' 1"  (1.854 m)   Wt 178 lb 0.6 oz (80.8 kg)   SpO2 99%   BMI 23.49 kg/m     Wt Readings from Last 3 Encounters:  06/01/20 178 lb 0.6 oz (80.8 kg)  05/08/20 179 lb (81.2 kg)  04/28/20 179 lb (81.2 kg)     GEN:  Well nourished, well developed in no acute distress HEENT: Normal NECK: No JVD; No carotid bruits LYMPHATICS: No lymphadenopathy CARDIAC: RRR, no murmurs, rubs, gallops RESPIRATORY:  Clear to auscultation without rales, wheezing or rhonchi  ABDOMEN: Soft, non-tender, non-distended MUSCULOSKELETAL:  No edema; No deformity  SKIN: Warm and dry NEUROLOGIC:  Alert and oriented x 3 PSYCHIATRIC:  Normal affect    Signed, Shirlee More, MD  06/01/2020 9:18 AM    [place]

## 2020-06-01 ENCOUNTER — Telehealth: Payer: Self-pay

## 2020-06-01 ENCOUNTER — Encounter: Payer: Self-pay | Admitting: Cardiology

## 2020-06-01 ENCOUNTER — Ambulatory Visit (INDEPENDENT_AMBULATORY_CARE_PROVIDER_SITE_OTHER): Payer: 59 | Admitting: Cardiology

## 2020-06-01 ENCOUNTER — Other Ambulatory Visit: Payer: Self-pay

## 2020-06-01 VITALS — BP 124/84 | HR 64 | Ht 73.0 in | Wt 178.0 lb

## 2020-06-01 DIAGNOSIS — I10 Essential (primary) hypertension: Secondary | ICD-10-CM | POA: Diagnosis not present

## 2020-06-01 DIAGNOSIS — E7801 Familial hypercholesterolemia: Secondary | ICD-10-CM

## 2020-06-01 DIAGNOSIS — I2510 Atherosclerotic heart disease of native coronary artery without angina pectoris: Secondary | ICD-10-CM

## 2020-06-01 MED ORDER — NITROGLYCERIN 0.4 MG SL SUBL: 0.4000 mg | SUBLINGUAL_TABLET | SUBLINGUAL | 3 refills | Status: DC | PRN

## 2020-06-01 NOTE — Patient Instructions (Signed)
Medication Instructions:  °Your physician recommends that you continue on your current medications as directed. Please refer to the Current Medication list given to you today. ° °*If you need a refill on your cardiac medications before your next appointment, please call your pharmacy* ° ° °Lab Work: °Your physician recommends that you return for lab work in: TODAY °CMP, Lipids, LPA °If you have labs (blood work) drawn today and your tests are completely normal, you will receive your results only by: °• MyChart Message (if you have MyChart) OR °• A paper copy in the mail °If you have any lab test that is abnormal or we need to change your treatment, we will call you to review the results. ° ° °Testing/Procedures: °None ° ° °Follow-Up: °At [facility], you and your health needs are our priority.  As part of our continuing mission to provide you with exceptional heart care, we have created designated Provider Care Teams.  These Care Teams include your primary Cardiologist (physician) and Advanced Practice Providers (APPs -  Physician Assistants and Nurse Practitioners) who all work together to provide you with the care you need, when you need it. ° °We recommend signing up for the patient portal called "MyChart".  Sign up information is provided on this After Visit Summary.  MyChart is used to connect with patients for Virtual Visits (Telemedicine).  Patients are able to view lab/test results, encounter notes, upcoming appointments, etc.  Non-urgent messages can be sent to your provider as well.   °To learn more about what you can do with MyChart, go to [email or web address].   ° °Your next appointment:   °1 year(s) ° °The format for your next appointment:   °In Person ° °Provider:   °Brian Munley, MD ° ° °Other Instructions ° ° °

## 2020-06-01 NOTE — Telephone Encounter (Signed)
lmom to call us back to let us know if they have changed insurances

## 2020-06-02 ENCOUNTER — Telehealth: Payer: Self-pay | Admitting: Cardiology

## 2020-06-02 ENCOUNTER — Telehealth: Payer: Self-pay

## 2020-06-02 LAB — COMPREHENSIVE METABOLIC PANEL
ALT: 30 IU/L (ref 0–44)
AST: 26 IU/L (ref 0–40)
Albumin/Globulin Ratio: 2.1 (ref 1.2–2.2)
Albumin: 4.6 g/dL (ref 3.8–4.9)
Alkaline Phosphatase: 64 IU/L (ref 44–121)
BUN/Creatinine Ratio: 16 (ref 9–20)
BUN: 14 mg/dL (ref 6–24)
Bilirubin Total: 0.5 mg/dL (ref 0.0–1.2)
CO2: 21 mmol/L (ref 20–29)
Calcium: 9.4 mg/dL (ref 8.7–10.2)
Chloride: 100 mmol/L (ref 96–106)
Creatinine, Ser: 0.9 mg/dL (ref 0.76–1.27)
Globulin, Total: 2.2 g/dL (ref 1.5–4.5)
Glucose: 103 mg/dL — ABNORMAL HIGH (ref 65–99)
Potassium: 4.7 mmol/L (ref 3.5–5.2)
Sodium: 139 mmol/L (ref 134–144)
Total Protein: 6.8 g/dL (ref 6.0–8.5)
eGFR: 101 mL/min/{1.73_m2} (ref >59)

## 2020-06-02 LAB — LIPID PANEL
Chol/HDL Ratio: 2 ratio (ref 0.0–5.0)
Cholesterol, Total: 135 mg/dL (ref 100–199)
HDL: 68 mg/dL (ref >39)
LDL Chol Calc (NIH): 48 mg/dL (ref 0–99)
Triglycerides: 103 mg/dL (ref 0–149)
VLDL Cholesterol Cal: 19 mg/dL (ref 5–40)

## 2020-06-02 LAB — LIPOPROTEIN A (LPA): Lipoprotein (a): 9 nmol/L (ref <75.0)

## 2020-06-02 NOTE — Telephone Encounter (Signed)
Follow up:      Patient returning Tim Strickland call back for results.

## 2020-06-02 NOTE — Telephone Encounter (Signed)
Spoke with patient regarding results and recommendation.  Patient verbalizes understanding and is agreeable to plan of care. Advised patient to call back with any issues or concerns.  

## 2020-06-02 NOTE — Telephone Encounter (Signed)
-----   Message from Richardo Priest, MD sent at 06/02/2020  7:47 AM EDT ----- Tim Strickland results his lipids are ideal no changes

## 2020-06-02 NOTE — Telephone Encounter (Signed)
Left message on patients voicemail to please return our call.   

## 2020-06-09 ENCOUNTER — Telehealth: Payer: Self-pay

## 2020-06-09 NOTE — Telephone Encounter (Signed)
Per Rx benefit Repatha 168ml has been approved from 06/09/2020-06/08/2021. Confirmation to this authorization [id number].

## 2020-09-19 ENCOUNTER — Other Ambulatory Visit: Payer: Self-pay | Admitting: Cardiology

## 2020-09-21 NOTE — Telephone Encounter (Signed)
Zetia 10 mg # 90 x 3 refills sent to [id number] - [place], [place] [address]

## 2020-12-13 ENCOUNTER — Other Ambulatory Visit: Payer: Self-pay | Admitting: Cardiology

## 2021-01-25 ENCOUNTER — Other Ambulatory Visit: Payer: Self-pay

## 2021-01-25 ENCOUNTER — Ambulatory Visit (INDEPENDENT_AMBULATORY_CARE_PROVIDER_SITE_OTHER): Payer: 59 | Admitting: Medical

## 2021-01-25 VITALS — BP 126/77 | HR 58 | Temp 98.1°F | Resp 18 | Ht 73.0 in | Wt 177.8 lb

## 2021-01-25 DIAGNOSIS — R739 Hyperglycemia, unspecified: Secondary | ICD-10-CM | POA: Diagnosis not present

## 2021-01-25 DIAGNOSIS — Z0000 Encounter for general adult medical examination without abnormal findings: Secondary | ICD-10-CM | POA: Diagnosis not present

## 2021-01-25 DIAGNOSIS — Z1211 Encounter for screening for malignant neoplasm of colon: Secondary | ICD-10-CM

## 2021-01-25 DIAGNOSIS — Z125 Encounter for screening for malignant neoplasm of prostate: Secondary | ICD-10-CM

## 2021-01-25 LAB — CBC WITH DIFFERENTIAL/PLATELET
Basophils Absolute: 0.1 10*3/uL (ref 0.0–0.1)
Basophils Relative: 1.8 % (ref 0.0–3.0)
Eosinophils Absolute: 0.1 10*3/uL (ref 0.0–0.7)
Eosinophils Relative: 1.9 % (ref 0.0–5.0)
HCT: 46 % (ref 39.0–52.0)
Hemoglobin: 15.5 g/dL (ref 13.0–17.0)
Lymphocytes Relative: 24.6 % (ref 12.0–46.0)
Lymphs Abs: 1.9 10*3/uL (ref 0.7–4.0)
MCHC: 33.7 g/dL (ref 30.0–36.0)
MCV: 94.6 fl (ref 78.0–100.0)
Monocytes Absolute: 0.6 10*3/uL (ref 0.1–1.0)
Monocytes Relative: 8.3 % (ref 3.0–12.0)
Neutro Abs: 5 10*3/uL (ref 1.4–7.7)
Neutrophils Relative %: 63.4 % (ref 43.0–77.0)
Platelets: 284 10*3/uL (ref 150.0–400.0)
RBC: 4.86 Mil/uL (ref 4.22–5.81)
RDW: 13.2 % (ref 11.5–15.5)
WBC: 7.8 10*3/uL (ref 4.0–10.5)

## 2021-01-25 LAB — COMPREHENSIVE METABOLIC PANEL
ALT: 27 U/L (ref 0–53)
AST: 23 U/L (ref 0–37)
Albumin: 4.8 g/dL (ref 3.5–5.2)
Alkaline Phosphatase: 59 U/L (ref 39–117)
BUN: 12 mg/dL (ref 6–23)
CO2: 29 mEq/L (ref 19–32)
Calcium: 9.7 mg/dL (ref 8.4–10.5)
Chloride: 101 mEq/L (ref 96–112)
Creatinine, Ser: 0.99 mg/dL (ref 0.40–1.50)
GFR: 86.12 mL/min (ref >60.00)
Glucose, Bld: 100 mg/dL — ABNORMAL HIGH (ref 70–99)
Potassium: 5.2 mEq/L — ABNORMAL HIGH (ref 3.5–5.1)
Sodium: 137 mEq/L (ref 135–145)
Total Bilirubin: 0.8 mg/dL (ref 0.2–1.2)
Total Protein: 7.2 g/dL (ref 6.0–8.3)

## 2021-01-25 LAB — PSA: PSA: 1.32 ng/mL (ref 0.10–4.00)

## 2021-01-25 LAB — LIPID PANEL
Cholesterol: 138 mg/dL (ref 0–200)
HDL: 81.7 mg/dL (ref >39.00)
LDL Cholesterol: 37 mg/dL (ref 0–99)
NonHDL: 56.64
Total CHOL/HDL Ratio: 2
Triglycerides: 100 mg/dL (ref 0.0–149.0)
VLDL: 20 mg/dL (ref 0.0–40.0)

## 2021-01-25 NOTE — Patient Instructions (Addendum)
For you wellness exam today I have ordered cbc, cmp and lipid panel.  Placed gi referral for colonoscopy  Psa screening lab today.  Flu vaccines declined. Recommend ask insurance if they cover shingrex.  Recommend exercise and healthy diet.  We will let you know lab results as they come in.   Follow up date appointment will be determined after lab review.  Preventive Care 20-55 Years Old, Male Preventive care refers to lifestyle choices and visits with your health care provider that can promote health and wellness. Preventive care visits are also called wellness exams. What can I expect for my preventive care visit? Counseling During your preventive care visit, your health care provider may ask about your: Medical history, including: Past medical problems. Family medical history. Current health, including: Emotional well-being. Home life and relationship well-being. Sexual activity. Lifestyle, including: Alcohol, nicotine or tobacco, and drug use. Access to firearms. Diet, exercise, and sleep habits. Safety issues such as seatbelt and bike helmet use. Sunscreen use. Work and work Statistician. Physical exam Your health care provider will check your: Height and weight. These may be used to calculate your BMI (body mass index). BMI is a measurement that tells if you are at a healthy weight. Waist circumference. This measures the distance around your waistline. This measurement also tells if you are at a healthy weight and may help predict your risk of certain diseases, such as type 2 diabetes and high blood pressure. Heart rate and blood pressure. Body temperature. Skin for abnormal spots. What immunizations do I need? Vaccines are usually given at various ages, according to a schedule. Your health care provider will recommend vaccines for you based on your age, medical history, and lifestyle or other factors, such as travel or where you work. What tests do I  need? Screening Your health care provider may recommend screening tests for certain conditions. This may include: Lipid and cholesterol levels. Diabetes screening. This is done by checking your blood sugar (glucose) after you have not eaten for a while (fasting). Hepatitis B test. Hepatitis C test. HIV (human immunodeficiency virus) test. STI (sexually transmitted infection) testing, if you are at risk. Lung cancer screening. Prostate cancer screening. Colorectal cancer screening. Talk with your health care provider about your test results, treatment options, and if necessary, the need for more tests. Follow these instructions at home: Eating and drinking  Eat a diet that includes fresh fruits and vegetables, whole grains, lean protein, and low-fat dairy products. Take vitamin and mineral supplements as recommended by your health care provider. Do not drink alcohol if your health care provider tells you not to drink. If you drink alcohol: Limit how much you have to 0-2 drinks a day. Know how much alcohol is in your drink. In the [place] one drink equals one 12 oz bottle of beer (355 mL), one 5 oz glass of wine (148 mL), or one 1 oz glass of hard liquor (44 mL). Lifestyle Brush your teeth every morning and night with fluoride toothpaste. Floss one time each day. Exercise for at least 30 minutes 5 or more days each week. Do not use any products that contain nicotine or tobacco. These products include cigarettes, chewing tobacco, and vaping devices, such as e-cigarettes. If you need help quitting, ask your health care provider. Do not use drugs. If you are sexually active, practice safe sex. Use a condom or other form of protection to prevent STIs. Take aspirin only as told by your health care provider. Make sure that  you understand how much to take and what form to take. Work with your health care provider to find out whether it is safe and beneficial for you to take aspirin daily. Find  healthy ways to manage stress, such as: Meditation, yoga, or listening to music. Journaling. Talking to a trusted person. Spending time with friends and family. Minimize exposure to UV radiation to reduce your risk of skin cancer. Safety Always wear your seat belt while driving or riding in a vehicle. Do not drive: If you have been drinking alcohol. Do not ride with someone who has been drinking. When you are tired or distracted. While texting. If you have been using any mind-altering substances or drugs. Wear a helmet and other protective equipment during sports activities. If you have firearms in your house, make sure you follow all gun safety procedures. What's next? Go to your health care provider once a year for an annual wellness visit. Ask your health care provider how often you should have your eyes and teeth checked. Stay up to date on all vaccines. This information is not intended to replace advice given to you by your health care provider. Make sure you discuss any questions you have with your health care provider. Document Revised: 08/19/2020 Document Reviewed: 08/19/2020 Elsevier Patient Education  [place].

## 2021-01-25 NOTE — Progress Notes (Signed)
Subjective:    Patient ID: Tim Strickland, male    DOB: September 29, 1965, 55 y.o.   MRN: [id number]  HPI  Here for wellness exam.   Pt works Architect. Pt eats healthy. 3-4 beers a day. Non smoker.    Hx of htn, high cholesterol, CAD and elevated sugar.    Pt had 2 covid vaccines.   Declines flu vaccine.   Pt never had colonoscopy. Pt states did not get done as he had heart procedures that took priority.   Pt has history of elevated wbc at time of cabbage in 2020.   Sugar was elevated in past.      Review of Systems  Constitutional:  Negative for chills, fatigue and fever.  HENT:  Negative for congestion and dental problem.   Respiratory:  Negative for cough, chest tightness, shortness of breath and wheezing.   Cardiovascular:  Negative for chest pain and palpitations.  Gastrointestinal:  Negative for abdominal pain, blood in stool, constipation, diarrhea and nausea.  Genitourinary:  Negative for dysuria and frequency.  Musculoskeletal:  Negative for back pain.  Neurological:  Negative for dizziness, syncope, weakness, numbness and headaches.  Hematological:  Negative for adenopathy. Does not bruise/bleed easily.  Psychiatric/Behavioral:  Negative for behavioral problems and confusion.    Past Medical History:  Diagnosis Date   Asthma 12/05/2008   Qualifier: Diagnosis of  By: Linna Darner MD, William   Onset @ age 6 after environmental exposures (home built in 73s). No  maintenance agents; Rx: avoidance of triggers (paint fumes, dust) Singulair & albuterol prn     Bursitis of right shoulder 01/15/2014   Ultrasound guided injection 01/15/2014    CAD (coronary artery disease), native coronary artery 01/25/2018   Coronary artery disease    Elevated liver enzymes    PMH of  on Pravachol   Encounter for general adult medical examination with abnormal findings 11/04/2016   Environmental asthma    Familial hyperlipidemia 12/05/2008   Qualifier: Diagnosis of  By: Linna Darner MD,  Cristopher Estimable Lipoprofile 2006: LDL 162 ( 1985/ 1351), HDL 51, TG 108.  NMR Lipoprofile 01/2014 : LDL 69 ( 885 / 601  ),  HDL 62 , Triglycerides 233 . LP-IR 62. LDL goal= < 110, ideally < 80. Father MI @ before 44; PGF MI in 29s. PGM CVA in 60s Note: previously on Pravastatin 20 mg ; stopped because  mild hepatic enzyme rise. [facility] Panel 01/2011: NOT h   HTN (hypertension) 03/18/2018   Hyperglycemia    A1c 5.2% in 2010   Hyperlipidemia    Hypertension    Keloid of skin 07/24/2018   Left wrist tendinitis 02/23/2016   Meralgia paraesthetica, left 04/19/2018   Paresthesia 07/24/2018   Right rotator cuff tear 04/26/2016   Injected A 17 2018.   S/P CABG x 5    RIMA to LAD LIMA to Diagonal 2 SVG to Diagonal 1 Sequential SVG to PD and PLB    Shoulder impingement syndrome, right 02/23/2016   Synovitis of toe 02/23/2016     Social History   Socioeconomic History   Marital status: Married    Spouse name: Not on file   Number of children: 2   Years of education: 16   Highest education level: Not on file  Occupational History   Occupation: Remodler  Tobacco Use   Smoking status: Never   Smokeless tobacco: Never  Vaping Use   Vaping Use: Never used  Substance and Sexual Activity   Alcohol  use: Yes    Alcohol/week: 18.0 standard drinks    Types: 18 Cans of beer per week    Comment: 03/16/2018 "2-3 beers daily"   Drug use: Not Currently   Sexual activity: Yes  Other Topics Concern   Not on file  Social History Narrative   Not on file   Social Determinants of Health   Financial Resource Strain: Not on file  Food Insecurity: Not on file  Transportation Needs: Not on file  Physical Activity: Not on file  Stress: Not on file  Social Connections: Not on file  Intimate Partner Violence: Not on file    Past Surgical History:  Procedure Laterality Date   APPENDECTOMY     CORONARY ARTERY BYPASS GRAFT N/A 03/19/2018   Procedure: CORONARY ARTERY BYPASS GRAFTING (CABG), ON PUMP, TIMES  FIVE, USING BILATERAL INTERNAL MAMMARY ARTERIES AND ENDOSCOPICALLY HARVESTED RIGHT GREATER SAPHENOUS VEIN;  Surgeon: Gaye Pollack, MD;  Location: [facility];  Service: Open Heart Surgery;  Laterality: N/A;   LEFT HEART CATH AND CORONARY ANGIOGRAPHY N/A 03/16/2018   Procedure: LEFT HEART CATH AND CORONARY ANGIOGRAPHY;  Surgeon: Wellington Hampshire, MD;  Location: [facility];  Service: Cardiovascular;  Laterality: N/A;   TEE WITHOUT CARDIOVERSION N/A 03/19/2018   Procedure: TRANSESOPHAGEAL ECHOCARDIOGRAM (TEE);  Surgeon: Gaye Pollack, MD;  Location: [facility];  Service: Open Heart Surgery;  Laterality: N/A;   TONSILLECTOMY     VASECTOMY     WISDOM TOOTH EXTRACTION      Family History  Problem Relation Age of Onset   Heart attack Father        pre 64   Diabetes Sister        DM 1   Heart attack Paternal Grandfather         in 45s   Gout Paternal Grandfather    Cancer Paternal Uncle 28       bone    Mental illness Maternal Grandmother        "breakdown"   Healthy Mother    Autoimmune disease Mother    Stroke Paternal Grandmother     Allergies  Allergen Reactions   Pravastatin Other (See Comments)    04/03/2009: AST 51 and ALT 101 on pravastatin 20 mg daily. LDL at that time was 192.6.    Current Outpatient Medications on File Prior to Visit  Medication Sig Dispense Refill   albuterol (PROVENTIL HFA;VENTOLIN HFA) 108 (90 Base) MCG/ACT inhaler Inhale 2 puffs into the lungs every 6 (six) hours as needed for wheezing or shortness of breath. 3 Inhaler 0   aspirin EC 81 MG tablet Take 1 tablet (81 mg total) by mouth daily. 90 tablet 3   Evolocumab (REPATHA SURECLICK) 976 MG/ML SOAJ INJECT 140 MG INTO THE SKIN EVERY 14 DAYS. 2 mL 11   ezetimibe (ZETIA) 10 MG tablet TAKE 1 TABLET BY MOUTH EVERY DAY 90 tablet 3   metoprolol tartrate (LOPRESSOR) 25 MG tablet TAKE 1 TABLET BY MOUTH TWICE A DAY 180 tablet 1   telmisartan (MICARDIS) 20 MG tablet Take 1 tablet (20 mg total) by mouth daily. 90  tablet 3   nitroGLYCERIN (NITROSTAT) 0.4 MG SL tablet Place 1 tablet (0.4 mg total) under the tongue every 5 (five) minutes as needed for chest pain. 25 tablet 3   No current facility-administered medications on file prior to visit.    BP 126/77   Pulse (!) 58   Temp 98.1 F (36.7 C)   Resp 18  Ht 6\' 1"  (1.854 m)   Wt 177 lb 12.8 oz (80.6 kg)   SpO2 99%   BMI 23.46 kg/m       Objective:   Physical Exam  General Mental Status- Alert. General Appearance- Not in acute distress.   Skin On inspection no worrisome moles or lesions.  Neck Carotid Arteries- Normal color. Moisture- Normal Moisture. No carotid bruits. No JVD.  Chest and Lung Exam Auscultation: Breath Sounds:-Normal.  Cardiovascular Auscultation:Rythm- Regular. Murmurs & Other Heart Sounds:Auscultation of the heart reveals- No Murmurs.  Abdomen Inspection:-Inspeection Normal. Palpation/Percussion:Note:No mass. Palpation and Percussion of the abdomen reveal- Non Tender, Non Distended + BS, no rebound or guarding.   Neurologic Cranial Nerve exam:- CN III-XII intact(No nystagmus), symmetric smile. Strength:- 5/5 equal and symmetric strength both upper and lower extremities.        Assessment & Plan:  For you wellness exam today I have ordered cbc, cmp and lipid panel.  Placed gi referral for colonoscopy  Psa screening lab today.  Flu vaccines declined. Recommend ask insurance if they cover shingrex.  Recommend exercise and healthy diet.  We will let you know lab results as they come in.  Mackie Pai, PA-C

## 2021-02-24 ENCOUNTER — Other Ambulatory Visit: Payer: Self-pay | Admitting: Cardiology

## 2021-03-03 ENCOUNTER — Telehealth: Payer: Self-pay

## 2021-03-03 NOTE — Telephone Encounter (Signed)
Tim Strickland,  Pt was originally scheduled in March for colon, however, cancelled due to chest pain; Patient has had a CABG x 5 in 2020;  Please review chart and advise if appropriate for [place] colon scheduled on 03/29/2021; charting does not show the patient followed up with cardiology since- Thank you

## 2021-03-05 NOTE — Telephone Encounter (Signed)
Noted on PV chart ?

## 2021-03-16 ENCOUNTER — Other Ambulatory Visit: Payer: Self-pay

## 2021-03-16 ENCOUNTER — Ambulatory Visit (AMBULATORY_SURGERY_CENTER): Payer: Self-pay | Admitting: *Deleted

## 2021-03-16 VITALS — Ht 73.0 in | Wt 180.4 lb

## 2021-03-16 DIAGNOSIS — Z1211 Encounter for screening for malignant neoplasm of colon: Secondary | ICD-10-CM

## 2021-03-16 MED ORDER — NA SULFATE-K SULFATE-MG SULF 17.5-3.13-1.6 GM/177ML PO SOLN: 1.0000 | Freq: Once | ORAL | 0 refills | Status: AC

## 2021-03-16 NOTE — Progress Notes (Signed)

## 2021-03-24 ENCOUNTER — Encounter: Payer: Self-pay | Admitting: Gastroenterology

## 2021-03-27 ENCOUNTER — Encounter: Payer: Self-pay | Admitting: Certified Registered Nurse Anesthetist

## 2021-03-29 ENCOUNTER — Encounter: Payer: Self-pay | Admitting: Gastroenterology

## 2021-03-29 ENCOUNTER — Ambulatory Visit (AMBULATORY_SURGERY_CENTER): Payer: 59 | Admitting: Gastroenterology

## 2021-03-29 VITALS — BP 98/69 | HR 66 | Temp 96.0°F | Resp 14 | Ht 73.0 in | Wt 180.4 lb

## 2021-03-29 DIAGNOSIS — K635 Polyp of colon: Secondary | ICD-10-CM | POA: Diagnosis not present

## 2021-03-29 DIAGNOSIS — D125 Benign neoplasm of sigmoid colon: Secondary | ICD-10-CM | POA: Diagnosis not present

## 2021-03-29 DIAGNOSIS — Z1211 Encounter for screening for malignant neoplasm of colon: Secondary | ICD-10-CM | POA: Diagnosis not present

## 2021-03-29 MED ORDER — SODIUM CHLORIDE 0.9 % IV SOLN: 500.0000 mL | Freq: Once | INTRAVENOUS | Status: DC

## 2021-03-29 NOTE — Progress Notes (Signed)
[facility] History and Physical   Primary Care Physician:  Mackie Pai, PA-C   Reason for Procedure:   Colon cancer screening  Plan:    colonoscopy     HPI: Tim Strickland is a 56 y.o. male  here for colonoscopy screening - first time exam. Patient denies any bowel symptoms at this time. No family history of colon cancer known. Otherwise feels well without any cardiopulmonary symptoms.    Past Medical History:  Diagnosis Date   Asthma 12/05/2008   Qualifier: Diagnosis of  By: Linna Darner MD, William   Onset @ age 5 after environmental exposures (home built in 42s). No  maintenance agents; Rx: avoidance of triggers (paint fumes, dust) Singulair & albuterol prn     Bursitis of right shoulder 01/15/2014   Ultrasound guided injection 01/15/2014    CAD (coronary artery disease), native coronary artery 01/25/2018   Coronary artery disease    Elevated liver enzymes    PMH of  on Pravachol   Encounter for general adult medical examination with abnormal findings 11/04/2016   Environmental asthma    Familial hyperlipidemia 12/05/2008   Qualifier: Diagnosis of  By: Linna Darner MD, Cristopher Estimable Lipoprofile 2006: LDL 162 ( 1985/ 1351), HDL 51, TG 108.  NMR Lipoprofile 01/2014 : LDL 69 ( 885 / 601  ),  HDL 62 , Triglycerides 233 . LP-IR 62. LDL goal= < 110, ideally < 80. Father MI @ before 46; PGF MI in 43s. PGM CVA in 60s Note: previously on Pravastatin 20 mg ; stopped because  mild hepatic enzyme rise. [facility] Panel 01/2011: NOT h   HTN (hypertension) 03/18/2018   Hyperglycemia    A1c 5.2% in 2010   Hyperlipidemia    Hypertension    Keloid of skin 07/24/2018   Left wrist tendinitis 02/23/2016   Meralgia paraesthetica, left 04/19/2018   Paresthesia 07/24/2018   Right rotator cuff tear 04/26/2016   Injected A 17 2018.   S/P CABG x 5    RIMA to LAD LIMA to Diagonal 2 SVG to Diagonal 1 Sequential SVG to PD and PLB    Shoulder impingement syndrome, right 02/23/2016   Synovitis of  toe 02/23/2016    Past Surgical History:  Procedure Laterality Date   APPENDECTOMY     CORONARY ARTERY BYPASS GRAFT N/A 03/19/2018   Procedure: CORONARY ARTERY BYPASS GRAFTING (CABG), ON PUMP, TIMES FIVE, USING BILATERAL INTERNAL MAMMARY ARTERIES AND ENDOSCOPICALLY HARVESTED RIGHT GREATER SAPHENOUS VEIN;  Surgeon: Gaye Pollack, MD;  Location: [facility];  Service: Open Heart Surgery;  Laterality: N/A;   LEFT HEART CATH AND CORONARY ANGIOGRAPHY N/A 03/16/2018   Procedure: LEFT HEART CATH AND CORONARY ANGIOGRAPHY;  Surgeon: Wellington Hampshire, MD;  Location: [facility];  Service: Cardiovascular;  Laterality: N/A;   TEE WITHOUT CARDIOVERSION N/A 03/19/2018   Procedure: TRANSESOPHAGEAL ECHOCARDIOGRAM (TEE);  Surgeon: Gaye Pollack, MD;  Location: [facility];  Service: Open Heart Surgery;  Laterality: N/A;   TONSILLECTOMY     VASECTOMY     WISDOM TOOTH EXTRACTION      Prior to Admission medications   Medication Sig Start Date End Date Taking? Authorizing Provider  aspirin EC 81 MG tablet Take 1 tablet (81 mg total) by mouth daily. 04/19/18  Yes Richardo Priest, MD  Evolocumab (REPATHA SURECLICK) 956 MG/ML SOAJ INJECT 140 MG INTO THE SKIN EVERY 14 DAYS. 12/14/20  Yes Richardo Priest, MD  ezetimibe (ZETIA) 10 MG tablet TAKE 1 TABLET BY MOUTH  EVERY DAY 09/21/20  Yes Richardo Priest, MD  metoprolol tartrate (LOPRESSOR) 25 MG tablet TAKE 1 TABLET BY MOUTH TWICE A DAY 02/24/21  Yes Richardo Priest, MD  telmisartan (MICARDIS) 20 MG tablet Take 1 tablet (20 mg total) by mouth daily. 04/28/20  Yes Richardo Priest, MD  albuterol (PROVENTIL HFA;VENTOLIN HFA) 108 (90 Base) MCG/ACT inhaler Inhale 2 puffs into the lungs every 6 (six) hours as needed for wheezing or shortness of breath. 06/05/18   Marrian Salvage, FNP  nitroGLYCERIN (NITROSTAT) 0.4 MG SL tablet Place 1 tablet (0.4 mg total) under the tongue every 5 (five) minutes as needed for chest pain. 06/01/20 08/30/20  Richardo Priest, MD    Current  Outpatient Medications  Medication Sig Dispense Refill   aspirin EC 81 MG tablet Take 1 tablet (81 mg total) by mouth daily. 90 tablet 3   Evolocumab (REPATHA SURECLICK) 220 MG/ML SOAJ INJECT 140 MG INTO THE SKIN EVERY 14 DAYS. 2 mL 11   ezetimibe (ZETIA) 10 MG tablet TAKE 1 TABLET BY MOUTH EVERY DAY 90 tablet 3   metoprolol tartrate (LOPRESSOR) 25 MG tablet TAKE 1 TABLET BY MOUTH TWICE A DAY 180 tablet 1   telmisartan (MICARDIS) 20 MG tablet Take 1 tablet (20 mg total) by mouth daily. 90 tablet 3   albuterol (PROVENTIL HFA;VENTOLIN HFA) 108 (90 Base) MCG/ACT inhaler Inhale 2 puffs into the lungs every 6 (six) hours as needed for wheezing or shortness of breath. 3 Inhaler 0   nitroGLYCERIN (NITROSTAT) 0.4 MG SL tablet Place 1 tablet (0.4 mg total) under the tongue every 5 (five) minutes as needed for chest pain. 25 tablet 3   Current Facility-Administered Medications  Medication Dose Route Frequency Provider Last Rate Last Admin   0.9 %  sodium chloride infusion  500 mL Intravenous Once Vivek Grealish, Carlota Raspberry, MD        Allergies as of 03/29/2021 - Review Complete 03/29/2021  Allergen Reaction Noted   Pravastatin Other (See Comments) 07/01/2011    Family History  Problem Relation Age of Onset   Healthy Mother    Autoimmune disease Mother    Heart attack Father        pre 59   Diabetes Sister        DM 1   Cancer Paternal Uncle 26       bone    Mental illness Maternal Grandmother        "breakdown"   Colon polyps Maternal Grandfather    Stroke Paternal Grandmother    Heart attack Paternal Grandfather         in 49s   Gout Paternal Grandfather    Colon cancer Neg Hx    Esophageal cancer Neg Hx    Rectal cancer Neg Hx    Stomach cancer Neg Hx     Social History   Socioeconomic History   Marital status: Married    Spouse name: Not on file   Number of children: 2   Years of education: 16   Highest education level: Not on file  Occupational History   Occupation: Remodler   Tobacco Use   Smoking status: Never   Smokeless tobacco: Never  Vaping Use   Vaping Use: Never used  Substance and Sexual Activity   Alcohol use: Yes    Alcohol/week: 18.0 standard drinks    Types: 18 Cans of beer per week    Comment: 03/16/2018 "2-3 beers daily"   Drug use: Not Currently  Sexual activity: Yes  Other Topics Concern   Not on file  Social History Narrative   Not on file   Social Determinants of Health   Financial Resource Strain: Not on file  Food Insecurity: Not on file  Transportation Needs: Not on file  Physical Activity: Not on file  Stress: Not on file  Social Connections: Not on file  Intimate Partner Violence: Not on file    Review of Systems: All other review of systems negative except as mentioned in the HPI.  Physical Exam: Vital signs BP 114/83    Pulse 64    Temp (!) 96 F (35.6 C) (Temporal)    Resp 13    Ht 6\' 1"  (1.854 m)    Wt 180 lb 6.4 oz (81.8 kg)    SpO2 93%    BMI 23.80 kg/m   General:   Alert,  Well-developed, pleasant and cooperative in NAD Lungs:  Clear throughout to auscultation.   Heart:  Regular rate and rhythm Abdomen:  Soft, nontender and nondistended.   Neuro/Psych:  Alert and cooperative. Normal mood and affect. A and O x 3  Jolly Mango, MD [facility]

## 2021-03-29 NOTE — Patient Instructions (Signed)
Discharge instructions given. ?Handouts on polyps and Hemorrhoids. ?Resume previous medications. ?YOU HAD AN ENDOSCOPIC PROCEDURE TODAY AT THE [facility]:   Refer to the procedure report that was given to you for any specific questions about what was found during the examination.  If the procedure report does not answer your questions, please call your gastroenterologist to clarify.  If you requested that your care partner not be given the details of your procedure findings, then the procedure report has been included in a sealed envelope for you to review at your convenience later. ? ?YOU SHOULD EXPECT: Some feelings of bloating in the abdomen. Passage of more gas than usual.  Walking can help get rid of the air that was put into your GI tract during the procedure and reduce the bloating. If you had a lower endoscopy (such as a colonoscopy or flexible sigmoidoscopy) you may notice spotting of blood in your stool or on the toilet paper. If you underwent a bowel prep for your procedure, you may not have a normal bowel movement for a few days. ? ?Please Note:  You might notice some irritation and congestion in your nose or some drainage.  This is from the oxygen used during your procedure.  There is no need for concern and it should clear up in a day or so. ? ?SYMPTOMS TO REPORT IMMEDIATELY: ? ?Following lower endoscopy (colonoscopy or flexible sigmoidoscopy): ? Excessive amounts of blood in the stool ? Significant tenderness or worsening of abdominal pains ? Swelling of the abdomen that is new, acute ? Fever of 100?F or higher ? ? ?For urgent or emergent issues, a gastroenterologist can be reached at any hour by calling [phone]. ?Do not use MyChart messaging for urgent concerns.  ? ? ?DIET:  We do recommend a small meal at first, but then you may proceed to your regular diet.  Drink plenty of fluids but you should avoid alcoholic beverages for 24 hours. ? ?ACTIVITY:  You should plan to take it  easy for the rest of today and you should NOT DRIVE or use heavy machinery until tomorrow (because of the sedation medicines used during the test).   ? ?FOLLOW UP: ?Our staff will call the number listed on your records 48-72 hours following your procedure to check on you and address any questions or concerns that you may have regarding the information given to you following your procedure. If we do not reach you, we will leave a message.  We will attempt to reach you two times.  During this call, we will ask if you have developed any symptoms of COVID 19. If you develop any symptoms (ie: fever, flu-like symptoms, shortness of breath, cough etc.) before then, please call [phone].  If you test positive for Covid 19 in the 2 weeks post procedure, please call and report this information to us.   ? ?If any biopsies were taken you will be contacted by phone or by letter within the next 1-3 weeks.  Please call us at [phone] if you have not heard about the biopsies in 3 weeks.  ? ? ?SIGNATURES/CONFIDENTIALITY: ?You and/or your care partner have signed paperwork which will be entered into your electronic medical record.  These signatures attest to the fact that that the information above on your After Visit Summary has been reviewed and is understood.  Full responsibility of the confidentiality of this discharge information lies with you and/or your care-partner.  ?

## 2021-03-29 NOTE — Op Note (Signed)
[facility] Patient Name: Tim Strickland Procedure Date: 03/29/2021 11:21 AM MRN: [id number] Endoscopist: Remo Lipps P. Havery Moros , MD Age: 56 Referring MD:  Date of Birth: [birth year] Gender: Male Account #: [id number] Procedure:                Colonoscopy Indications:              Screening for colorectal malignant neoplasm, This                            is the patient's first colonoscopy Medicines:                Monitored Anesthesia Care Procedure:                Pre-Anesthesia Assessment:                           - Prior to the procedure, a History and Physical                            was performed, and patient medications and                            allergies were reviewed. The patient's tolerance of                            previous anesthesia was also reviewed. The risks                            and benefits of the procedure and the sedation                            options and risks were discussed with the patient.                            All questions were answered, and informed consent                            was obtained. Prior Anticoagulants: The patient has                            taken no previous anticoagulant or antiplatelet                            agents. ASA Grade Assessment: III - A patient with                            severe systemic disease. After reviewing the risks                            and benefits, the patient was deemed in                            satisfactory condition to undergo the procedure.  After obtaining informed consent, the colonoscope                            was passed under direct vision. Throughout the                            procedure, the patient's blood pressure, pulse, and                            oxygen saturations were monitored continuously. The                            Olympus Colonoscope #[id number] was introduced through                            the anus and  advanced to the the cecum, identified                            by appendiceal orifice and ileocecal valve. The                            colonoscopy was performed without difficulty. The                            patient tolerated the procedure well. The quality                            of the bowel preparation was good. The ileocecal                            valve, appendiceal orifice, and rectum were                            photographed. Scope In: 11:28:14 AM Scope Out: 11:43:45 AM Scope Withdrawal Time: 0 hours 13 minutes 34 seconds  Total Procedure Duration: 0 hours 15 minutes 31 seconds  Findings:                 The perianal and digital rectal examinations were                            normal.                           Two sessile polyps were found in the sigmoid colon.                            The polyps were 3 mm in size. These polyps were                            removed with a cold snare. Resection and retrieval                            were complete.  Anal papilla(e) were hypertrophied.                           Internal hemorrhoids were found during                            retroflexion. The hemorrhoids were small.                           The exam was otherwise without abnormality. Complications:            No immediate complications. Estimated blood loss:                            Minimal. Estimated Blood Loss:     Estimated blood loss was minimal. Impression:               - Two 3 mm polyps in the sigmoid colon, removed                            with a cold snare. Resected and retrieved.                           - Anal papilla(e) were hypertrophied.                           - Internal hemorrhoids.                           - The examination was otherwise normal. Recommendation:           - Patient has a contact number available for                            emergencies. The signs and symptoms of potential                             delayed complications were discussed with the                            patient. Return to normal activities tomorrow.                            Written discharge instructions were provided to the                            patient.                           - Resume previous diet.                           - Continue present medications.                           - Await pathology results. Remo Lipps P. Havery Moros, MD 03/29/2021 11:46:49 AM This report has been signed electronically.

## 2021-03-29 NOTE — Progress Notes (Signed)
Report given to PACU, vss 

## 2021-03-29 NOTE — Progress Notes (Signed)
Called to room to assist during endoscopic procedure.  Patient ID and intended procedure confirmed with present staff. Received instructions for my participation in the procedure from the performing physician.  

## 2021-03-29 NOTE — Progress Notes (Signed)
Pt's states no medical or surgical changes since previsit or office visit.  ° °VS DT °

## 2021-03-31 ENCOUNTER — Telehealth: Payer: Self-pay | Admitting: *Deleted

## 2021-03-31 NOTE — Telephone Encounter (Signed)
Attempted 2nd f/u phone call. No answer. Left message.  °

## 2021-03-31 NOTE — Telephone Encounter (Signed)
°  Follow up Call-  Call back number 03/29/2021  Post procedure Call Back phone  # [phone]  Permission to leave phone message Yes  Some recent data might be hidden     Patient questions:  Do you have a fever, pain , or abdominal swelling? No. Pain Score  0 *  Have you tolerated food without any problems? Yes.    Have you been able to return to your normal activities? Yes.    Do you have any questions about your discharge instructions: Diet   No. Medications  No. Follow up visit  No.  Do you have questions or concerns about your Care? No.  Actions: * If pain score is 4 or above: No action needed, pain <4.  Have you developed a fever since your procedure? no  2.   Have you had an respiratory symptoms (SOB or cough) since your procedure? no  3.   Have you tested positive for COVID 19 since your procedure no  4.   Have you had any family members/close contacts diagnosed with the COVID 19 since your procedure?  no   If yes to any of these questions please route to Joylene John, RN and Joella Prince, RN

## 2021-05-24 ENCOUNTER — Other Ambulatory Visit: Payer: Self-pay | Admitting: Cardiology

## 2021-05-25 ENCOUNTER — Telehealth: Payer: Self-pay

## 2021-05-25 NOTE — Telephone Encounter (Signed)
Received approval for Repatha 321 mg/ml Sureclick from 22/48/2500 to 05/14/2022 from Rx Benefits. ?Member ID Number :[id number] ?[place] Number: [id number] ?Date of Review 05/17/2021 ?To continue this medication beyond the approval time frame, a renewal authorization will need to be requested. ?Any questions or concerns contact [employer] Department at [phone] or fax [phone] ?

## 2021-05-27 ENCOUNTER — Encounter: Payer: Self-pay | Admitting: Cardiology

## 2021-05-27 ENCOUNTER — Other Ambulatory Visit: Payer: Self-pay

## 2021-05-27 ENCOUNTER — Ambulatory Visit (INDEPENDENT_AMBULATORY_CARE_PROVIDER_SITE_OTHER): Payer: 59 | Admitting: Cardiology

## 2021-05-27 VITALS — BP 114/78 | HR 58 | Ht 73.0 in | Wt 179.0 lb

## 2021-05-27 DIAGNOSIS — E7801 Familial hypercholesterolemia: Secondary | ICD-10-CM

## 2021-05-27 DIAGNOSIS — I10 Essential (primary) hypertension: Secondary | ICD-10-CM | POA: Diagnosis not present

## 2021-05-27 DIAGNOSIS — I2510 Atherosclerotic heart disease of native coronary artery without angina pectoris: Secondary | ICD-10-CM | POA: Diagnosis not present

## 2021-05-27 MED ORDER — EZETIMIBE 10 MG PO TABS: 10.0000 mg | ORAL_TABLET | Freq: Every day | ORAL | 3 refills | Status: DC

## 2021-05-27 MED ORDER — METOPROLOL TARTRATE 25 MG PO TABS: 25.0000 mg | ORAL_TABLET | Freq: Every day | ORAL | 3 refills | Status: DC

## 2021-05-27 MED ORDER — REPATHA SURECLICK 140 MG/ML ~~LOC~~ SOAJ: SUBCUTANEOUS | 3 refills | Status: DC

## 2021-05-27 MED ORDER — NITROGLYCERIN 0.4 MG SL SUBL: 0.4000 mg | SUBLINGUAL_TABLET | SUBLINGUAL | 3 refills | Status: DC | PRN

## 2021-05-27 MED ORDER — TELMISARTAN 20 MG PO TABS: 20.0000 mg | ORAL_TABLET | Freq: Every day | ORAL | 3 refills | Status: DC

## 2021-05-27 NOTE — Patient Instructions (Signed)
Medication Instructions:  ?Your physician has recommended you make the following change in your medication:  ? ?Decrease your Metoprolol to once daily. ? ?*If you need a refill on your cardiac medications before your next appointment, please call your pharmacy* ? ? ?Lab Work: ?None ordered ?If you have labs (blood work) drawn today and your tests are completely normal, you will receive your results only by: ?MyChart Message (if you have MyChart) OR ?A paper copy in the mail ?If you have any lab test that is abnormal or we need to change your treatment, we will call you to review the results. ? ? ?Testing/Procedures: ?None ordered ? ? ?Follow-Up: ?At [facility], you and your health needs are our priority.  As part of our continuing mission to provide you with exceptional heart care, we have created designated Provider Care Teams.  These Care Teams include your primary Cardiologist (physician) and Advanced Practice Providers (APPs -  Physician Assistants and Nurse Practitioners) who all work together to provide you with the care you need, when you need it. ? ?We recommend signing up for the patient portal called "MyChart".  Sign up information is provided on this After Visit Summary.  MyChart is used to connect with patients for Virtual Visits (Telemedicine).  Patients are able to view lab/test results, encounter notes, upcoming appointments, etc.  Non-urgent messages can be sent to your provider as well.   ?To learn more about what you can do with MyChart, go to [email or web address].   ? ?Your next appointment:   ?12 month(s) ? ?The format for your next appointment:   ?In Person ? ?Provider:   ?Shirlee More, MD ? ? ?Other Instructions ?NA  ?

## 2021-05-27 NOTE — Progress Notes (Signed)
?Cardiology Office Note:   ? ?Date:  05/27/2021  ? ?ID:  Tim Strickland, DOB 05/22/1965, MRN [id number] ? ?PCP:  Mackie Pai, PA-C  ?Cardiologist:  Shirlee More, MD   ? ?Referring MD: Mackie Pai, PA-C  ? ? ?ASSESSMENT:   ? ?1. Coronary artery disease involving native coronary artery of native heart without angina pectoris   ?2. Hypertension, unspecified type   ?3. Familial hypercholesterolemia   ? ?PLAN:   ? ?In order of problems listed above: ? ?[name] continues to do well with CAD following surgical revascularization 2020 having no angina on current medical therapy and he had a low risk reassuring perfusion study 1 year ago.  Continue guideline directed therapy with aspirin beta-blocker taking once daily along with nonstatin treatment and ideal cholesterol LDL ?BP at target continue ARB ?Continue current treatment when available he could transition to oral PCSK9 inhibitor ?Follow-up labs in his PCP office this fall with wellness exam ? ? ? ?Next appointment: 1 year ? ? ?Medication Adjustments/Labs and Tests Ordered: ?Current medicines are reviewed at length with the patient today.  Concerns regarding medicines are outlined above.  ?Orders Placed This Encounter  ?Procedures  ? EKG 12-Lead  ? ?Meds ordered this encounter  ?Medications  ? telmisartan (MICARDIS) 20 MG tablet  ?  Sig: Take 1 tablet (20 mg total) by mouth daily.  ?  Dispense:  90 tablet  ?  Refill:  3  ? nitroGLYCERIN (NITROSTAT) 0.4 MG SL tablet  ?  Sig: Place 1 tablet (0.4 mg total) under the tongue every 5 (five) minutes as needed for chest pain.  ?  Dispense:  25 tablet  ?  Refill:  3  ? metoprolol tartrate (LOPRESSOR) 25 MG tablet  ?  Sig: Take 1 tablet (25 mg total) by mouth daily.  ?  Dispense:  90 tablet  ?  Refill:  3  ? ezetimibe (ZETIA) 10 MG tablet  ?  Sig: Take 1 tablet (10 mg total) by mouth daily.  ?  Dispense:  90 tablet  ?  Refill:  3  ? Evolocumab (REPATHA SURECLICK) 601 MG/ML SOAJ  ?  Sig: INJECT 140 MG INTO THE SKIN EVERY 14  DAYS.  ?  Dispense:  6 mL  ?  Refill:  3  ? ? ?Chief complaint follow-up for CAD ? ? ?History of Present Illness:   ? ?Tim Strickland is a 57 y.o. male with a hx of CAD CABG January 2020 hypertension and familial hyperlipidemia last seen 05/12/2020. ? ?Compliance with diet, lifestyle and medications: Yes ? ?He has had a good year no anginal discomfort edema shortness of breath chest pain palpitation or syncope ?Tolerates his lipid-lowering Zetia plus PCSK9 inhibitors without muscle pain or weakness and his LDL is ideal and LP(a) levels not elevated ?Past Medical History:  ?Diagnosis Date  ? Asthma 12/05/2008  ? Qualifier: Diagnosis of  By: Linna Darner MD, William   Onset @ age 80 after environmental exposures (home built in 13s). No  maintenance agents; Rx: avoidance of triggers (paint fumes, dust) Singulair & albuterol prn    ? Bursitis of right shoulder 01/15/2014  ? Ultrasound guided injection 01/15/2014   ? CAD (coronary artery disease), native coronary artery 01/25/2018  ? Coronary artery disease   ? Elevated liver enzymes   ? PMH of  on Pravachol  ? Encounter for general adult medical examination with abnormal findings 11/04/2016  ? Environmental asthma   ? Familial hyperlipidemia 12/05/2008  ? Qualifier: Diagnosis of  By: Linna Darner MD, Cristopher Estimable Lipoprofile 2006: LDL 162 ( 1985/ 1351), HDL 51, TG 108.  NMR Lipoprofile 01/2014 : LDL 69 ( 885 / 601  ),  HDL 62 , Triglycerides 233 . LP-IR 62. LDL goal= < 110, ideally < 80. Father MI @ before 39; PGF MI in 47s. PGM CVA in 60s Note: previously on Pravastatin 20 mg ; stopped because  mild hepatic enzyme rise. [place] [facility] Panel 01/2011: NOT h  ? HTN (hypertension) 03/18/2018  ? Hyperglycemia   ? A1c 5.2% in 2010  ? Hyperlipidemia   ? Hypertension   ? Keloid of skin 07/24/2018  ? Left wrist tendinitis 02/23/2016  ? Meralgia paraesthetica, left 04/19/2018  ? Paresthesia 07/24/2018  ? Right rotator cuff tear 04/26/2016  ? Injected A 17 2018.  ? S/P CABG x 5   ? RIMA to LAD LIMA  to Diagonal 2 SVG to Diagonal 1 Sequential SVG to PD and PLB   ? Shoulder impingement syndrome, right 02/23/2016  ? Synovitis of toe 02/23/2016  ? ? ?Past Surgical History:  ?Procedure Laterality Date  ? APPENDECTOMY    ? CORONARY ARTERY BYPASS GRAFT N/A 03/19/2018  ? Procedure: CORONARY ARTERY BYPASS GRAFTING (CABG), ON PUMP, TIMES FIVE, USING BILATERAL INTERNAL MAMMARY ARTERIES AND ENDOSCOPICALLY HARVESTED RIGHT GREATER SAPHENOUS VEIN;  Surgeon: Gaye Pollack, MD;  Location: [facility];  Service: Open Heart Surgery;  Laterality: N/A;  ? LEFT HEART CATH AND CORONARY ANGIOGRAPHY N/A 03/16/2018  ? Procedure: LEFT HEART CATH AND CORONARY ANGIOGRAPHY;  Surgeon: Wellington Hampshire, MD;  Location: [facility];  Service: Cardiovascular;  Laterality: N/A;  ? TEE WITHOUT CARDIOVERSION N/A 03/19/2018  ? Procedure: TRANSESOPHAGEAL ECHOCARDIOGRAM (TEE);  Surgeon: Gaye Pollack, MD;  Location: [facility];  Service: Open Heart Surgery;  Laterality: N/A;  ? TONSILLECTOMY    ? VASECTOMY    ? WISDOM TOOTH EXTRACTION    ? ? ?Current Medications: ?Current Meds  ?Medication Sig  ? albuterol (PROVENTIL HFA;VENTOLIN HFA) 108 (90 Base) MCG/ACT inhaler Inhale 2 puffs into the lungs every 6 (six) hours as needed for wheezing or shortness of breath.  ? aspirin EC 81 MG tablet Take 1 tablet (81 mg total) by mouth daily.  ? [DISCONTINUED] Evolocumab (REPATHA SURECLICK) 956 MG/ML SOAJ INJECT 140 MG INTO THE SKIN EVERY 14 DAYS.  ? [DISCONTINUED] ezetimibe (ZETIA) 10 MG tablet TAKE 1 TABLET BY MOUTH EVERY DAY  ? [DISCONTINUED] metoprolol tartrate (LOPRESSOR) 25 MG tablet TAKE 1 TABLET BY MOUTH TWICE A DAY  ? [DISCONTINUED] nitroGLYCERIN (NITROSTAT) 0.4 MG SL tablet Place 1 tablet (0.4 mg total) under the tongue every 5 (five) minutes as needed for chest pain.  ? [DISCONTINUED] telmisartan (MICARDIS) 20 MG tablet Take 1 tablet (20 mg total) by mouth daily.  ?  ? ?Allergies:   Pravastatin  ? ?Social History  ? ?Socioeconomic History  ? Marital  status: Married  ?  Spouse name: Not on file  ? Number of children: 2  ? Years of education: 95  ? Highest education level: Not on file  ?Occupational History  ? Occupation: Remodler  ?Tobacco Use  ? Smoking status: Never  ?  Passive exposure: Never  ? Smokeless tobacco: Never  ?Vaping Use  ? Vaping Use: Never used  ?Substance and Sexual Activity  ? Alcohol use: Yes  ?  Alcohol/week: 18.0 standard drinks  ?  Types: 18 Cans of beer per week  ?  Comment: 03/16/2018 "2-3 beers daily"  ? Drug  use: Not Currently  ? Sexual activity: Yes  ?Other Topics Concern  ? Not on file  ?Social History Narrative  ? Not on file  ? ?Social Determinants of Health  ? ?Financial Resource Strain: Not on file  ?Food Insecurity: Not on file  ?Transportation Needs: Not on file  ?Physical Activity: Not on file  ?Stress: Not on file  ?Social Connections: Not on file  ?  ? ?Family History: ?The patient's family history includes Autoimmune disease in his mother; Cancer (age of onset: 6) in his paternal uncle; Colon polyps in his maternal grandfather; Diabetes in his sister; Gout in his paternal grandfather; Healthy in his mother; Heart attack in his father and paternal grandfather; Mental illness in his maternal grandmother; Stroke in his paternal grandmother. There is no history of Colon cancer, Esophageal cancer, Rectal cancer, or Stomach cancer. ?ROS:   ?Please see the history of present illness.    ?All other systems reviewed and are negative. ? ?EKGs/Labs/Other Studies Reviewed:   ? ?The following studies were reviewed today: ? ?Gated SPECT 05/08/2020: ?Study Highlights ?  ?The left ventricular ejection fraction is normal (55-65%). ?Nuclear stress EF: 58%. ?Blood pressure demonstrated a hypertensive response to exercise. ?There was no ST segment deviation noted during stress. ?The study is normal. ?This is a low risk study. ?Stress Findings ?  ?ECG Baseline ECG exhibits normal sinus rhythm.Marland Kitchen  ?Stress Findings The patient exercised following  the Bruce protocol.   ?The patient reported dyspnea during the stress test. The patient experienced no angina during the stress test.  ? ?The test was stopped because  the patient complained of fatigue an

## 2021-06-25 ENCOUNTER — Ambulatory Visit (INDEPENDENT_AMBULATORY_CARE_PROVIDER_SITE_OTHER): Payer: 59 | Admitting: Medical

## 2021-06-25 ENCOUNTER — Ambulatory Visit (HOSPITAL_BASED_OUTPATIENT_CLINIC_OR_DEPARTMENT_OTHER)
Admission: RE | Admit: 2021-06-25 | Discharge: 2021-06-25 | Disposition: A | Discharge status: Home / Self Care | Payer: 59 | Source: Ambulatory Visit | Attending: Medical | Admitting: Medical

## 2021-06-25 VITALS — BP 126/80 | HR 79 | Resp 18 | Ht 73.0 in | Wt 179.0 lb

## 2021-06-25 DIAGNOSIS — E875 Hyperkalemia: Secondary | ICD-10-CM

## 2021-06-25 DIAGNOSIS — M898X1 Other specified disorders of bone, shoulder: Secondary | ICD-10-CM | POA: Diagnosis present

## 2021-06-25 DIAGNOSIS — R739 Hyperglycemia, unspecified: Secondary | ICD-10-CM

## 2021-06-25 LAB — COMPREHENSIVE METABOLIC PANEL
ALT: 22 U/L (ref 0–53)
AST: 20 U/L (ref 0–37)
Albumin: 4.5 g/dL (ref 3.5–5.2)
Alkaline Phosphatase: 55 U/L (ref 39–117)
BUN: 16 mg/dL (ref 6–23)
CO2: 26 mEq/L (ref 19–32)
Calcium: 9.2 mg/dL (ref 8.4–10.5)
Chloride: 103 mEq/L (ref 96–112)
Creatinine, Ser: 0.98 mg/dL (ref 0.40–1.50)
GFR: 86.92 mL/min (ref >60.00)
Glucose, Bld: 101 mg/dL — ABNORMAL HIGH (ref 70–99)
Potassium: 4.4 mEq/L (ref 3.5–5.1)
Sodium: 138 mEq/L (ref 135–145)
Total Bilirubin: 0.8 mg/dL (ref 0.2–1.2)
Total Protein: 6.6 g/dL (ref 6.0–8.3)

## 2021-06-25 LAB — HEMOGLOBIN A1C: Hgb A1c MFr Bld: 5.5 % (ref 4.6–6.5)

## 2021-06-25 MED ORDER — TRAMADOL HCL 50 MG PO TABS: 50.0000 mg | ORAL_TABLET | Freq: Four times a day (QID) | ORAL | 0 refills | Status: AC | PRN

## 2021-06-25 MED ORDER — METHYLPREDNISOLONE 4 MG PO TABS: ORAL_TABLET | ORAL | 0 refills | Status: DC

## 2021-06-25 MED ORDER — CYCLOBENZAPRINE HCL 10 MG PO TABS: ORAL_TABLET | ORAL | 0 refills | Status: AC

## 2021-06-25 NOTE — Progress Notes (Signed)
? ?Subjective:  ? ? Patient ID: Tim Strickland, male    DOB: 10-21-1965, 56 y.o.   MRN: [id number] ? ?HPI ? ?Pt in for recent pain that started on Sunday. Pt states on Sunday he was waling and noted pain in left scapula area. Pt stats can feel muscle spasms in scapula area. Also when he points chin down will have pain in left side upper back. Sometimes moving left arm will have scapula area pain. No injury reported at time of the injury. ? ? ?No chest pain, no sob. No mid neck pain reported. ? ?Pt traveling to [place] next Tuesday.  ? ? ?Mild high sugar on prior labs. Mild high k on last labs. ? ?No rash seen. ? ? ? ?Review of Systems  ?Constitutional:  Negative for chills and fever.  ?Respiratory:  Negative for cough, choking, shortness of breath and wheezing.   ?Cardiovascular:  Negative for chest pain.  ?Gastrointestinal:  Negative for abdominal pain.  ?Musculoskeletal:  Negative for back pain.  ?     Left scapula area pain on palpation and left upper ext movment.  ?Neurological:  Negative for dizziness and headaches.  ?Hematological:  Negative for adenopathy. Does not bruise/bleed easily.  ?Psychiatric/Behavioral:  Negative for behavioral problems and confusion.   ? ? ?Past Medical History:  ?Diagnosis Date  ? Asthma 12/05/2008  ? Qualifier: Diagnosis of  By: Linna Darner MD, William   Onset @ age 75 after environmental exposures (home built in 48s). No  maintenance agents; Rx: avoidance of triggers (paint fumes, dust) Singulair & albuterol prn    ? Bursitis of right shoulder 01/15/2014  ? Ultrasound guided injection 01/15/2014   ? CAD (coronary artery disease), native coronary artery 01/25/2018  ? Coronary artery disease   ? Elevated liver enzymes   ? PMH of  on Pravachol  ? Encounter for general adult medical examination with abnormal findings 11/04/2016  ? Environmental asthma   ? Familial hyperlipidemia 12/05/2008  ? Qualifier: Diagnosis of  By: Linna Darner MD, Cristopher Estimable Lipoprofile 2006: LDL 162 ( 1985/ 1351),  HDL 51, TG 108.  NMR Lipoprofile 01/2014 : LDL 69 ( 885 / 601  ),  HDL 62 , Triglycerides 233 . LP-IR 62. LDL goal= < 110, ideally < 80. Father MI @ before 46; PGF MI in 16s. PGM CVA in 60s Note: previously on Pravastatin 20 mg ; stopped because  mild hepatic enzyme rise. [facility] Panel 01/2011: NOT h  ? HTN (hypertension) 03/18/2018  ? Hyperglycemia   ? A1c 5.2% in 2010  ? Hyperlipidemia   ? Hypertension   ? Keloid of skin 07/24/2018  ? Left wrist tendinitis 02/23/2016  ? Meralgia paraesthetica, left 04/19/2018  ? Paresthesia 07/24/2018  ? Right rotator cuff tear 04/26/2016  ? Injected A 17 2018.  ? S/P CABG x 5   ? RIMA to LAD LIMA to Diagonal 2 SVG to Diagonal 1 Sequential SVG to PD and PLB   ? Shoulder impingement syndrome, right 02/23/2016  ? Synovitis of toe 02/23/2016  ? ?  ?Social History  ? ?Socioeconomic History  ? Marital status: Married  ?  Spouse name: Not on file  ? Number of children: 2  ? Years of education: 65  ? Highest education level: Not on file  ?Occupational History  ? Occupation: Remodler  ?Tobacco Use  ? Smoking status: Never  ?  Passive exposure: Never  ? Smokeless tobacco: Never  ?Vaping Use  ? Vaping Use: Never used  ?  Substance and Sexual Activity  ? Alcohol use: Yes  ?  Alcohol/week: 18.0 standard drinks  ?  Types: 18 Cans of beer per week  ?  Comment: 03/16/2018 "2-3 beers daily"  ? Drug use: Not Currently  ? Sexual activity: Yes  ?Other Topics Concern  ? Not on file  ?Social History Narrative  ? Not on file  ? ?Social Determinants of Health  ? ?Financial Resource Strain: Not on file  ?Food Insecurity: Not on file  ?Transportation Needs: Not on file  ?Physical Activity: Not on file  ?Stress: Not on file  ?Social Connections: Not on file  ?Intimate Partner Violence: Not on file  ? ? ?Past Surgical History:  ?Procedure Laterality Date  ? APPENDECTOMY    ? CORONARY ARTERY BYPASS GRAFT N/A 03/19/2018  ? Procedure: CORONARY ARTERY BYPASS GRAFTING (CABG), ON PUMP, TIMES FIVE, USING BILATERAL  INTERNAL MAMMARY ARTERIES AND ENDOSCOPICALLY HARVESTED RIGHT GREATER SAPHENOUS VEIN;  Surgeon: Gaye Pollack, MD;  Location: [facility];  Service: Open Heart Surgery;  Laterality: N/A;  ? LEFT HEART CATH AND CORONARY ANGIOGRAPHY N/A 03/16/2018  ? Procedure: LEFT HEART CATH AND CORONARY ANGIOGRAPHY;  Surgeon: Wellington Hampshire, MD;  Location: [facility];  Service: Cardiovascular;  Laterality: N/A;  ? TEE WITHOUT CARDIOVERSION N/A 03/19/2018  ? Procedure: TRANSESOPHAGEAL ECHOCARDIOGRAM (TEE);  Surgeon: Gaye Pollack, MD;  Location: [facility];  Service: Open Heart Surgery;  Laterality: N/A;  ? TONSILLECTOMY    ? VASECTOMY    ? WISDOM TOOTH EXTRACTION    ? ? ?Family History  ?Problem Relation Age of Onset  ? Healthy Mother   ? Autoimmune disease Mother   ? Heart attack Father   ?     pre 59  ? Diabetes Sister   ?     DM 1  ? Cancer Paternal Uncle 27  ?     bone   ? Mental illness Maternal Grandmother   ?     "breakdown"  ? Colon polyps Maternal Grandfather   ? Stroke Paternal Grandmother   ? Heart attack Paternal Grandfather   ?      in 17s  ? Gout Paternal Grandfather   ? Colon cancer Neg Hx   ? Esophageal cancer Neg Hx   ? Rectal cancer Neg Hx   ? Stomach cancer Neg Hx   ? ? ?Allergies  ?Allergen Reactions  ? Pravastatin Other (See Comments)  ?  04/03/2009: AST 51 and ALT 101 on pravastatin 20 mg daily. LDL at that time was 192.6.  ? ? ?Current Outpatient Medications on File Prior to Visit  ?Medication Sig Dispense Refill  ? albuterol (PROVENTIL HFA;VENTOLIN HFA) 108 (90 Base) MCG/ACT inhaler Inhale 2 puffs into the lungs every 6 (six) hours as needed for wheezing or shortness of breath. 3 Inhaler 0  ? aspirin EC 81 MG tablet Take 1 tablet (81 mg total) by mouth daily. 90 tablet 3  ? Evolocumab (REPATHA SURECLICK) 977 MG/ML SOAJ INJECT 140 MG INTO THE SKIN EVERY 14 DAYS. 6 mL 3  ? ezetimibe (ZETIA) 10 MG tablet Take 1 tablet (10 mg total) by mouth daily. 90 tablet 3  ? metoprolol tartrate (LOPRESSOR) 25 MG tablet  Take 1 tablet (25 mg total) by mouth daily. 90 tablet 3  ? nitroGLYCERIN (NITROSTAT) 0.4 MG SL tablet Place 1 tablet (0.4 mg total) under the tongue every 5 (five) minutes as needed for chest pain. 25 tablet 3  ? telmisartan (MICARDIS) 20 MG tablet  Take 1 tablet (20 mg total) by mouth daily. 90 tablet 3  ? ?No current facility-administered medications on file prior to visit.  ? ? ?BP 126/80   Pulse 79   Resp 18   Ht '6\' 1"'$  (1.854 m)   Wt 179 lb (81.2 kg)   SpO2 97%   BMI 23.62 kg/m?  ?  ?   ?Objective:  ? Physical Exam ? ?General- No acute distress. Pleasant patient. ?Neck- Full range of motion, no jvd.  No mid cervical spine tenderness to palpation.  No upper trapezius tenderness palpation as well. ?Back-medial upper border of scapula point tender to palpation.  This is area of the spasm per patient.  Medial border of scapula itself little tender as well. ?Lungs- Clear, even and unlabored. ?Heart- regular rate and rhythm. ?Neurologic- CNII- XII grossly intact.  ? ?Left upper extremity-on wrist manipulation does not make slightly sensation worse.  Normal range of motion of hand/upper extremity. ? ?Left shoulder-no pain on palpation.  Good range of motion but on range of motion/movement of the shoulder reports scapular border pain. ?Skin-no rash seen. ? ? ? ?   ?Assessment & Plan:  ? ?Patient Instructions  ?Left scapular region pain most prominent region just medial upper aspect of scapula.  Point tender to palpation and pain with movement of left upper extremity as well as when you point your chin downwards.  This reports likely muscle pain.  Since no mechanism of injury decided to go ahead and get left scapula x-ray. ? ?Adding 6-day taper dose of Medrol, Flexeril tablets to use only at night and making tramadol available for severe pain.  Hopefully this regimen will keep you comfortable when you are on vacation upcoming week. ? ?Also on review did note mild high potassium on last labs with mild sugar  elevation.  Placed order for metabolic panel and 3 months sugar average test today. ? ?Discussed differential diagnoses.  You mention some tingling to your fingers and numbness.  No mid cervical spine neck pain.  If with

## 2021-06-25 NOTE — Patient Instructions (Signed)
Left scapular region pain most prominent region just medial upper aspect of scapula.  Point tender to palpation and pain with movement of left upper extremity as well as when you point your chin downwards.  This reports likely muscle pain.  Since no mechanism of injury decided to go ahead and get left scapula x-ray. ? ?Adding 6-day taper dose of Medrol, Flexeril tablets to use only at night and making tramadol available for severe pain.  Hopefully this regimen will keep you comfortable when you are on vacation upcoming week. ? ?Also on review did note mild high potassium on last labs with mild sugar elevation.  Placed order for metabolic panel and 3 months sugar average test today. ? ?Discussed differential diagnoses.  You mention some tingling to your fingers and numbness.  No mid cervical spine neck pain.  If with the above treatment you still have numbness to fingers then would recommend cervical spine x-ray.  Also if you have any rash to your back or arm let us know.  Particular there is no activity small blister eruption as in that would indicate shingles.  Though not the case presently. ? ?Follow-up in 10 days or sooner if needed. ?

## 2021-06-28 ENCOUNTER — Ambulatory Visit: Payer: 59 | Admitting: Medical

## 2022-04-04 ENCOUNTER — Other Ambulatory Visit (HOSPITAL_COMMUNITY): Payer: Self-pay

## 2022-04-05 ENCOUNTER — Other Ambulatory Visit (HOSPITAL_COMMUNITY): Payer: Self-pay

## 2022-04-07 ENCOUNTER — Telehealth: Payer: Self-pay

## 2022-04-07 NOTE — Telephone Encounter (Signed)
Prior authorization questionnaire completed faxed back to  Rx Benefits for Repatha 183 mg/ml sureclick.

## 2022-04-13 NOTE — Telephone Encounter (Signed)
PA approved for Repatha 140 mg from 04/12/22 until 04/12/23.

## 2022-04-26 ENCOUNTER — Encounter (HOSPITAL_COMMUNITY): Payer: Self-pay

## 2022-04-26 ENCOUNTER — Other Ambulatory Visit: Payer: Self-pay

## 2022-04-26 ENCOUNTER — Emergency Department (HOSPITAL_COMMUNITY)
Admission: EM | Admit: 2022-04-26 | Discharge: 2022-04-26 | Disposition: A | Discharge status: Home / Self Care | Payer: Managed Care, Other (non HMO) | Attending: Emergency Medicine | Admitting: Emergency Medicine

## 2022-04-26 DIAGNOSIS — Z7982 Long term (current) use of aspirin: Secondary | ICD-10-CM | POA: Diagnosis not present

## 2022-04-26 DIAGNOSIS — I10 Essential (primary) hypertension: Secondary | ICD-10-CM | POA: Diagnosis not present

## 2022-04-26 DIAGNOSIS — R55 Syncope and collapse: Secondary | ICD-10-CM | POA: Insufficient documentation

## 2022-04-26 DIAGNOSIS — Z79899 Other long term (current) drug therapy: Secondary | ICD-10-CM | POA: Insufficient documentation

## 2022-04-26 LAB — CBC
HCT: 44.2 % (ref 39.0–52.0)
Hemoglobin: 15.1 g/dL (ref 13.0–17.0)
MCH: 32 pg (ref 26.0–34.0)
MCHC: 34.2 g/dL (ref 30.0–36.0)
MCV: 93.6 fL (ref 80.0–100.0)
Platelets: 280 10*3/uL (ref 150–400)
RBC: 4.72 MIL/uL (ref 4.22–5.81)
RDW: 12.5 % (ref 11.5–15.5)
WBC: 11 10*3/uL — ABNORMAL HIGH (ref 4.0–10.5)
nRBC: 0 % (ref 0.0–0.2)

## 2022-04-26 LAB — URINALYSIS, ROUTINE W REFLEX MICROSCOPIC
Bacteria, UA: NONE SEEN
Bilirubin Urine: NEGATIVE
Glucose, UA: NEGATIVE mg/dL
Ketones, ur: NEGATIVE mg/dL
Leukocytes,Ua: NEGATIVE
Nitrite: NEGATIVE
Protein, ur: NEGATIVE mg/dL
Specific Gravity, Urine: 1.003 — ABNORMAL LOW (ref 1.005–1.030)
pH: 6 (ref 5.0–8.0)

## 2022-04-26 LAB — COMPREHENSIVE METABOLIC PANEL
ALT: 43 U/L (ref 0–44)
AST: 28 U/L (ref 15–41)
Albumin: 4.3 g/dL (ref 3.5–5.0)
Alkaline Phosphatase: 54 U/L (ref 38–126)
Anion gap: 10 (ref 5–15)
BUN: 12 mg/dL (ref 6–20)
CO2: 25 mmol/L (ref 22–32)
Calcium: 9.7 mg/dL (ref 8.9–10.3)
Chloride: 100 mmol/L (ref 98–111)
Creatinine, Ser: 0.99 mg/dL (ref 0.61–1.24)
GFR, Estimated: >60 mL/min (ref >60)
Glucose, Bld: 111 mg/dL — ABNORMAL HIGH (ref 70–99)
Potassium: 4.6 mmol/L (ref 3.5–5.1)
Sodium: 135 mmol/L (ref 135–145)
Total Bilirubin: 0.6 mg/dL (ref 0.3–1.2)
Total Protein: 7 g/dL (ref 6.5–8.1)

## 2022-04-26 LAB — TROPONIN I (HIGH SENSITIVITY)
Troponin I (High Sensitivity): 5 ng/L (ref <18)
Troponin I (High Sensitivity): 5 ng/L (ref <18)

## 2022-04-26 MED ORDER — SODIUM CHLORIDE 0.9 % IV BOLUS
1000.0000 mL | Freq: Once | INTRAVENOUS | Status: AC
  Administered 2022-04-26: 1000 mL via INTRAVENOUS

## 2022-04-26 NOTE — ED Provider Triage Note (Signed)
Emergency Medicine Provider Triage Evaluation Note  Tim Strickland , a 57 y.o. male  was evaluated in triage.  Pt complains of loss of consciousness.  He was hiking with a friend when he had onset of chest pressure.  He sat down, became diaphoretic and lost consciousness for about 45 seconds.  No history of the same, history of CABG in 2020, no active chest pain  Review of Systems  Positive: Syncope Negative:   Physical Exam  BP (!) 144/105 (BP Location: Right Arm)   Pulse 72   Temp 98.2 F (36.8 C) (Oral)   Resp 18   Ht 6' 1"$  (1.854 m)   Wt 81.2 kg   SpO2 98%   BMI 23.62 kg/m  Gen:   Awake, no distress   Resp:  Normal effort  MSK:   Moves extremities without difficulty  Other:    Medical Decision Making  Medically screening exam initiated at 2:42 PM.  Appropriate orders placed.  Tim Strickland was informed that the remainder of the evaluation will be completed by another provider, this initial triage assessment does not replace that evaluation, and the importance of remaining in the ED until their evaluation is complete.     Margarita Mail, PA-C 04/26/22 1501

## 2022-04-26 NOTE — ED Triage Notes (Addendum)
Patient was hiking and he sat down and said he didn't feel right and felt a cold sweat and had some chest pain and then had syncopal episode for about 45secs per friend and eyes rolled in to the back of his head.  Patient denies pain at this time but feels fatigue. Reports had a lot of diarrhea last night so unsure if he is dehydrated.

## 2022-04-26 NOTE — Discharge Instructions (Signed)
You came to the emergency department today after losing consciousness.  All of your lab work is reassuring.  Be sure to stay hydrated and please follow-up outpatient with your cardiologist, it appears as though your annual visit is due in March.  It was a pleasure to meet you and we hope you feel better.  Please return with any chest pain, shortness of breath, palpitations or further losses of consciousness.

## 2022-04-26 NOTE — ED Provider Notes (Signed)
[facility] Provider Note   CSN: [id number] Arrival date & time: 04/26/22  1417     History  Chief Complaint  Patient presents with   Loss of Consciousness   Chest Pain    Tim Strickland is a 57 y.o. male with a past medical history of hyperlipidemia, hypertension, hyperglycemia and CABG presenting today due to a syncopal episode.  He tells me that this morning he woke up feeling somewhat weak.  He subsequently had 4 episodes of diarrhea.  He then went hiking around the property with a friend who reported that he had a syncopal episode the patient tells me that he had some "chest discomfort" and felt clammy.  His friend reports that his eyes rolled in the back of his head for around 30 seconds and then came back around.  No convulsions.  Patient has no history of syncope or seizure.  No history of arrhythmia.  Denies any melena or hematochezia.   Loss of Consciousness Associated symptoms: no chest pain, no dizziness, no palpitations, no shortness of breath and no weakness   Chest Pain Associated symptoms: syncope   Associated symptoms: no dizziness, no palpitations, no shortness of breath and no weakness        Home Medications Prior to Admission medications   Medication Sig Start Date End Date Taking? Authorizing Provider  albuterol (PROVENTIL HFA;VENTOLIN HFA) 108 (90 Base) MCG/ACT inhaler Inhale 2 puffs into the lungs every 6 (six) hours as needed for wheezing or shortness of breath. 06/05/18   Marrian Salvage, FNP  aspirin EC 81 MG tablet Take 1 tablet (81 mg total) by mouth daily. 04/19/18   Richardo Priest, MD  cyclobenzaprine (FLEXERIL) 10 MG tablet 1 tab po q hs prn muscle spams/scapula pain 06/25/21   Saguier, Percell Miller, PA-C  Evolocumab (REPATHA SURECLICK) [id number] MG/ML SOAJ INJECT 140 MG INTO THE SKIN EVERY 14 DAYS. 05/27/21   Richardo Priest, MD  ezetimibe (ZETIA) 10 MG tablet Take 1 tablet (10 mg total) by mouth daily. 05/27/21    Richardo Priest, MD  methylPREDNISolone (MEDROL) 4 MG tablet Standard 6 day taper dose 06/25/21   Saguier, Percell Miller, PA-C  metoprolol tartrate (LOPRESSOR) 25 MG tablet Take 1 tablet (25 mg total) by mouth daily. 05/27/21   Richardo Priest, MD  nitroGLYCERIN (NITROSTAT) 0.4 MG SL tablet Place 1 tablet (0.4 mg total) under the tongue every 5 (five) minutes as needed for chest pain. 05/27/21 08/25/21  Richardo Priest, MD  telmisartan (MICARDIS) 20 MG tablet Take 1 tablet (20 mg total) by mouth daily. 05/27/21   Richardo Priest, MD      Allergies    Pravastatin    Review of Systems   Review of Systems  Respiratory:  Negative for shortness of breath.   Cardiovascular:  Positive for syncope. Negative for chest pain, palpitations and leg swelling.  Neurological:  Positive for syncope. Negative for dizziness and weakness.    Physical Exam Updated Vital Signs BP 127/85   Pulse 69   Temp 98.2 F (36.8 C) (Oral)   Resp 18   Ht 6' 1"$  (1.854 m)   Wt 81.2 kg   SpO2 99%   BMI 23.62 kg/m  Physical Exam Vitals and nursing note reviewed.  Constitutional:      General: He is not in acute distress.    Appearance: Normal appearance. He is not ill-appearing.  HENT:     Head: Normocephalic and atraumatic.  Eyes:     General: No scleral icterus.    Conjunctiva/sclera: Conjunctivae normal.  Cardiovascular:     Rate and Rhythm: Normal rate and regular rhythm.     Heart sounds: Normal heart sounds.  Pulmonary:     Effort: Pulmonary effort is normal. No respiratory distress.     Breath sounds: Normal breath sounds.  Skin:    Findings: No rash.  Neurological:     Mental Status: He is alert.  Psychiatric:        Mood and Affect: Mood normal.     ED Results / Procedures / Treatments   Labs (all labs ordered are listed, but only abnormal results are displayed) Labs Reviewed  CBC - Abnormal; Notable for the following components:      Result Value   WBC 11.0 (*)    All other components within  normal limits  URINALYSIS, ROUTINE W REFLEX MICROSCOPIC - Abnormal; Notable for the following components:   Color, Urine COLORLESS (*)    Specific Gravity, Urine 1.003 (*)    Hgb urine dipstick SMALL (*)    All other components within normal limits  COMPREHENSIVE METABOLIC PANEL - Abnormal; Notable for the following components:   Glucose, Bld 111 (*)    All other components within normal limits  CBG MONITORING, ED  TROPONIN I (HIGH SENSITIVITY)  TROPONIN I (HIGH SENSITIVITY)    EKG None  Radiology No results found.  Procedures Procedures   Medications Ordered in ED Medications  sodium chloride 0.9 % bolus 1,000 mL (0 mLs Intravenous Stopped 04/26/22 2013)    ED Course/ Medical Decision Making/ A&P             HEART Score: 3                Medical Decision Making Amount and/or Complexity of Data Reviewed Labs: ordered.   57 year old male presenting today with a questionable syncopal episode.  Differential includes but is not limited to syncope, seizure, arrhythmia, hypoglycemia, orthostasis.    This is not an exhaustive differential.    Past Medical History / Co-morbidities / Social History: Hyperlipidemia  Additional history: Per chart review patient follows very closely with Dr. Bettina Gavia.  He last saw cardiology in March 2023 and reports that he is supposed to see them soon.   Physical Exam: Pertinent physical exam findings include Benign.  Normal neuro and cardiac exam  Lab Tests: I ordered, and personally interpreted labs.  The pertinent results include: Unremarkable.  Troponin negative x 2  Cardiac Monitoring:  The patient was maintained on a cardiac monitor.  I viewed and interpreted the cardiac monitored which showed an underlying rhythm of: Normal sinus   Medications: Fluid bolus    MDM/Disposition: This is a 57 year old male who presented today with a syncopal episode.  Reports 4 episodes of diarrhea earlier today.  Also reports he has not had any  water, only tea.  He then went hiking, felt lightheaded and a friend saw that he had a syncopal episode.  No convulsions.  Did consider seizure however this is lower my differential.  No trauma in the oropharynx or history of seizure.  Normal labs.  Not orthostatic.  EKG stable with normal troponins.  Heart score 3.  Did consider head CT however patient's neurologic exam is completely normal.  Do not believe it was necessary.  Patient requesting discharge.  He believes he was dehydrated.  I do believe this is likely the cause but he was instructed to follow-up  with PCP.  Discharged at this time.    Final Clinical Impression(s) / ED Diagnoses Final diagnoses:  Syncope, unspecified syncope type    Rx / DC Orders ED Discharge Orders     None      Results and diagnoses were explained to the patient. Return precautions discussed in full. Patient had no additional questions and expressed complete understanding.   This chart was dictated using voice recognition software.  Despite best efforts to proofread,  errors can occur which can change the documentation meaning.    Darliss Ridgel 04/26/22 2313    Tegeler, Gwenyth Allegra, MD 04/26/22 [phone]

## 2022-04-28 ENCOUNTER — Encounter: Payer: Self-pay | Admitting: Medical

## 2022-04-28 ENCOUNTER — Encounter: Payer: Self-pay | Admitting: Cardiology

## 2022-04-28 ENCOUNTER — Ambulatory Visit (INDEPENDENT_AMBULATORY_CARE_PROVIDER_SITE_OTHER): Payer: Managed Care, Other (non HMO) | Admitting: Medical

## 2022-04-28 VITALS — BP 130/85 | HR 77 | Temp 98.0°F | Resp 18 | Ht 73.0 in | Wt 180.0 lb

## 2022-04-28 DIAGNOSIS — R5383 Other fatigue: Secondary | ICD-10-CM | POA: Diagnosis not present

## 2022-04-28 DIAGNOSIS — R002 Palpitations: Secondary | ICD-10-CM | POA: Diagnosis not present

## 2022-04-28 DIAGNOSIS — R55 Syncope and collapse: Secondary | ICD-10-CM | POA: Diagnosis not present

## 2022-04-28 DIAGNOSIS — G4489 Other headache syndrome: Secondary | ICD-10-CM

## 2022-04-28 DIAGNOSIS — Z951 Presence of aortocoronary bypass graft: Secondary | ICD-10-CM | POA: Diagnosis not present

## 2022-04-28 DIAGNOSIS — R739 Hyperglycemia, unspecified: Secondary | ICD-10-CM | POA: Diagnosis not present

## 2022-04-28 LAB — LIPID PANEL
Cholesterol: 131 mg/dL (ref 0–200)
HDL: 75 mg/dL (ref >39.00)
NonHDL: 55.88
Total CHOL/HDL Ratio: 2
Triglycerides: 225 mg/dL — ABNORMAL HIGH (ref 0.0–149.0)
VLDL: 45 mg/dL — ABNORMAL HIGH (ref 0.0–40.0)

## 2022-04-28 LAB — COMPREHENSIVE METABOLIC PANEL
ALT: 48 U/L (ref 0–53)
AST: 28 U/L (ref 0–37)
Albumin: 5.1 g/dL (ref 3.5–5.2)
Alkaline Phosphatase: 65 U/L (ref 39–117)
BUN: 15 mg/dL (ref 6–23)
CO2: 28 mEq/L (ref 19–32)
Calcium: 10.3 mg/dL (ref 8.4–10.5)
Chloride: 101 mEq/L (ref 96–112)
Creatinine, Ser: 0.92 mg/dL (ref 0.40–1.50)
GFR: 93.21 mL/min (ref >60.00)
Glucose, Bld: 93 mg/dL (ref 70–99)
Potassium: 4.5 mEq/L (ref 3.5–5.1)
Sodium: 138 mEq/L (ref 135–145)
Total Bilirubin: 0.6 mg/dL (ref 0.2–1.2)
Total Protein: 7.4 g/dL (ref 6.0–8.3)

## 2022-04-28 LAB — LDL CHOLESTEROL, DIRECT: Direct LDL: 35 mg/dL

## 2022-04-28 LAB — TSH: TSH: 2.25 u[IU]/mL (ref 0.35–5.50)

## 2022-04-28 LAB — HEMOGLOBIN A1C: Hgb A1c MFr Bld: 5.5 % (ref 4.6–6.5)

## 2022-04-28 NOTE — Patient Instructions (Addendum)
1. Syncope, unspecified syncope type One event the other day. Hx of CABG. Recent troponin negative x2. EKG today nsr. Talked with cardiologist today Dr. Kirkland Hun. Ask you call his office [phone] today  to get scheduled. I am hoping he can see you tomorrow. If pending that appointment symtoms worsen then be seen in the ED. - EKG 12-Lead - CT HEAD WO CONTRAST (5MM); Future  2. Palpitation Mayb get ziopatch or other work up. Specialist will decide. - EKG 12-Lead  3. Other headache syndrome New onset with recent syncope. Thought best to go ahead and order. If cardiac work up negative may refer to neurologist. - CT HEAD WO CONTRAST (5MM); Future   Continue current meds  Follow up date with me to be determined after lab review

## 2022-04-28 NOTE — Progress Notes (Signed)
Subjective:    Patient ID: Tim Strickland, male    DOB: Jan 24, 1966, 57 y.o.   MRN: [id number]  HPI  Pt in for follow up.  Pt states he feels very tired and maybe stress. He has low level headache.   Pt went to ED the other day.   Arrival date & time: 04/26/22  1417     " History      Chief Complaint  Patient presents with   Loss of Consciousness   Chest Pain     Tim Strickland is a 57 y.o. male with a past medical history of hyperlipidemia, hypertension, hyperglycemia and CABG presenting today due to a syncopal episode.  He tells me that this morning he woke up feeling somewhat weak.  He subsequently had 4 episodes of diarrhea.  He then went hiking around the property with a friend who reported that he had a syncopal episode the patient tells me that he had some "chest discomfort" and felt clammy.  His friend reports that his eyes rolled in the back of his head for around 30 seconds and then came back around.  No convulsions.  Patient has no history of syncope or seizure.  No history of arrhythmia.  Denies any melena or hematochezia."   Pt state loc for about 30-45 seconds.     WBC 11.0 (*)      All other components within normal limits  URINALYSIS, ROUTINE W REFLEX MICROSCOPIC - Abnormal; Notable for the following components:    Color, Urine COLORLESS (*)      Specific Gravity, Urine 1.003 (*)      Hgb urine dipstick SMALL (*)      All other components within normal limits  COMPREHENSIVE METABOLIC PANEL - Abnormal; Notable for the following components:    Glucose, Bld 111 (*)      All other components within normal limits  CBG MONITORING, ED  TROPONIN I (HIGH SENSITIVITY)  TROPONIN I (HIGH SENSITIVITY)    Additional history: Per chart review patient follows very closely with Dr. Bettina Gavia.  He last saw cardiology in March 2023 and reports that he is supposed to see them soon.    Cardiac Monitoring:  The patient was maintained on a cardiac monitor.  I viewed and  interpreted the cardiac monitored which showed an underlying rhythm of: Normal sinus    MDM/Disposition: This is a 57 year old male who presented today with a syncopal episode.  Reports 4 episodes of diarrhea earlier today.  Also reports he has not had any water, only tea.  He then went hiking, felt lightheaded and a friend saw that he had a syncopal episode.  No convulsions.  Did consider seizure however this is lower my differential.  No trauma in the oropharynx or history of seizure.  Normal labs.  Not orthostatic.  EKG stable with normal troponins.  Heart score 3.  Did consider head CT however patient's neurologic exam is completely normal.  Do not believe it was necessary.  Patient requesting discharge.  He believes he was dehydrated.  I do believe this is likely the cause but he was instructed to follow-up with PCP.  Discharged at this time."  On review of ED note no mention of recommended admission. Pt did request Dc per note review.    Pt states states no chest pain. But states can feel his heart beat. No jaw pain, no left shoulder pain now. No sweating. No chest pain. But on discussion he states can feel his  heart beat. Day that he past out he states could feel his heart beat and got cold sweat then passed out.   States very tired since ED evaluation. Low level headache back of his head and neck since ED discharge.   Ros- fatigue, scapula are pain, stress, ha and states can feel heart beat. No jaw pain, no arm pain, no sob, now wheezing and on diaphoresis.      Objective:   Physical Exam  General Mental Status- Alert. General Appearance- Not in acute distress.   Skin General: Color- Normal Color. Moisture- Normal Moisture.  Neck Carotid Arteries- Normal color. Moisture- Normal Moisture. No carotid bruits. No JVD.  Chest and Lung Exam Auscultation: Breath Sounds:-Normal.  Cardiovascular Auscultation:Rythm- Regular. Murmurs & Other Heart Sounds:Auscultation of the heart  reveals- No Murmurs.  Abdomen Inspection:-Inspeection Normal. Palpation/Percussion:Note:No mass. Palpation and Percussion of the abdomen reveal- Non Tender, Non Distended + BS, no rebound or guarding.   Neurologic Cranial Nerve exam:- CN III-XII intact(No nystagmus), symmetric smile. Drift Test:- No drift. Romberg Exam:- Negative.  Heal to Toe Gait exam:-Normal. Finger to Nose:- Normal/Intact Strength:- 5/5 equal and symmetric strength both upper and lower extremities.   Back- left upper back lower trapezius.      Assessment & Plan:   1. Syncope, unspecified syncope type One event the other day. Hx of CABG. Recent troponin negative x2. EKG today nsr. Talked with cardiologist today Dr. Kirkland Hun. Ask you call his office [phone] today  to get scheduled. I am hoping he can see you tomorrow. If pending that appointment symtoms worsen then be seen in the ED. - EKG 12-Lead - CT HEAD WO CONTRAST (5MM); Future  2. Palpitation Mayb get ziopatch or other work up. Specialist will deide. - EKG 12-Lead  3. Other headache syndrome New onset with recent syncope. Thought best to go ahead and order. If cardiac work up negative may refer to neurologist. - CT HEAD WO CONTRAST (5MM); Future   Continue current meds  Follow up date with me to be determined after lab review  Mackie Pai, PA-C    Time spent with patient today was  45 minutes which consisted of chart review, discussing diagnosie, work up ,treatment, consulting with cardiologist  and documentation.

## 2022-04-28 NOTE — Addendum Note (Signed)
Addended by: Anabel Halon on: 04/28/2022 05:08 PM   Modules accepted: Orders

## 2022-04-29 ENCOUNTER — Encounter: Payer: Self-pay | Admitting: Cardiology

## 2022-04-29 ENCOUNTER — Ambulatory Visit: Payer: Managed Care, Other (non HMO) | Attending: Cardiology | Admitting: Cardiology

## 2022-04-29 ENCOUNTER — Encounter: Payer: Self-pay | Admitting: Medical

## 2022-04-29 VITALS — BP 122/84 | HR 88 | Ht 73.0 in | Wt 178.2 lb

## 2022-04-29 DIAGNOSIS — I10 Essential (primary) hypertension: Secondary | ICD-10-CM

## 2022-04-29 DIAGNOSIS — Z951 Presence of aortocoronary bypass graft: Secondary | ICD-10-CM | POA: Diagnosis not present

## 2022-04-29 DIAGNOSIS — E782 Mixed hyperlipidemia: Secondary | ICD-10-CM

## 2022-04-29 MED ORDER — NITROGLYCERIN 0.4 MG SL SUBL: 0.4000 mg | SUBLINGUAL_TABLET | SUBLINGUAL | 3 refills | Status: DC | PRN

## 2022-04-29 NOTE — Progress Notes (Unsigned)
Cardiology Office Note:    Date:  04/29/2022   ID:  Tim Strickland, DOB January 15, 1966, MRN [id number]  PCP:  Mackie Pai, PA-C  Cardiologist:  Jenne Campus, MD    Referring MD: Elise Benne   No chief complaint on file. I had episode of syncope  History of Present Illness:    Tim Strickland is a 57 y.o. male with past medical history significant for coronary artery disease.  He did have coronary bypass graft with RIMA to LAD, LIMA to diagonal 2, SVG to diagonal 1 subsequent to PDA and PLB that was done in March 19, 2018, essential hypertension, dyslipidemia.  He comes today to my office after he had episode of syncope.  That happened on Tuesday on Monday evening he started feeling poorly he had diarrhea couple times watery there is no cramps there is no dizziness there is no fever no chills.  The next day he decided to go and walk with his friends in his property he does this trip all the time have no difficulty doing at this time he started having some chest tightness eventually started sweating profusely he sit on the bench and then passed out.  He was apparently unconscious for about 30 seconds to 45 seconds.  After that he went to emergency room workup in the emergency room was negative he was discharged home.  Since that time which was just 4 days ago he is feeling poorly he feels like he is catching a cold from IV fluid.  He is exhausted tired and his sleep all the time.  He also got some cough.  But no fever that he can think of.  Past Medical History:  Diagnosis Date   Asthma 12/05/2008   Qualifier: Diagnosis of  By: Linna Darner MD, William   Onset @ age 32 after environmental exposures (home built in 60s). No  maintenance agents; Rx: avoidance of triggers (paint fumes, dust) Singulair & albuterol prn     Bursitis of right shoulder 01/15/2014   Ultrasound guided injection 01/15/2014    CAD (coronary artery disease), native coronary artery 01/25/2018   Coronary artery  disease    Elevated liver enzymes    PMH of  on Pravachol   Encounter for general adult medical examination with abnormal findings 11/04/2016   Environmental asthma    Familial hyperlipidemia 12/05/2008   Qualifier: Diagnosis of  By: Linna Darner MD, Cristopher Estimable Lipoprofile 2006: LDL 162 ( 1985/ 1351), HDL 51, TG 108.  NMR Lipoprofile 01/2014 : LDL 69 ( 885 / 601  ),  HDL 62 , Triglycerides 233 . LP-IR 62. LDL goal= < 110, ideally < 80. Father MI @ before 58; PGF MI in 78s. PGM CVA in 60s Note: previously on Pravastatin 20 mg ; stopped because  mild hepatic enzyme rise. [facility] Panel 01/2011: NOT h   HTN (hypertension) 03/18/2018   Hyperglycemia    A1c 5.2% in 2010   Hyperlipidemia    Hypertension    Keloid of skin 07/24/2018   Left wrist tendinitis 02/23/2016   Meralgia paraesthetica, left 04/19/2018   Paresthesia 07/24/2018   Right rotator cuff tear 04/26/2016   Injected A 17 2018.   S/P CABG x 5    RIMA to LAD LIMA to Diagonal 2 SVG to Diagonal 1 Sequential SVG to PD and PLB    Shoulder impingement syndrome, right 02/23/2016   Synovitis of toe 02/23/2016    Past Surgical History:  Procedure Laterality Date   APPENDECTOMY  CORONARY ARTERY BYPASS GRAFT N/A 03/19/2018   Procedure: CORONARY ARTERY BYPASS GRAFTING (CABG), ON PUMP, TIMES FIVE, USING BILATERAL INTERNAL MAMMARY ARTERIES AND ENDOSCOPICALLY HARVESTED RIGHT GREATER SAPHENOUS VEIN;  Surgeon: Gaye Pollack, MD;  Location: [place];  Service: Open Heart Surgery;  Laterality: N/A;   LEFT HEART CATH AND CORONARY ANGIOGRAPHY N/A 03/16/2018   Procedure: LEFT HEART CATH AND CORONARY ANGIOGRAPHY;  Surgeon: Wellington Hampshire, MD;  Location: [place] [facility];  Service: Cardiovascular;  Laterality: N/A;   TEE WITHOUT CARDIOVERSION N/A 03/19/2018   Procedure: TRANSESOPHAGEAL ECHOCARDIOGRAM (TEE);  Surgeon: Gaye Pollack, MD;  Location: [place];  Service: Open Heart Surgery;  Laterality: N/A;   TONSILLECTOMY     VASECTOMY     WISDOM TOOTH  EXTRACTION      Current Medications: Current Meds  Medication Sig   albuterol (PROVENTIL HFA;VENTOLIN HFA) 108 (90 Base) MCG/ACT inhaler Inhale 2 puffs into the lungs every 6 (six) hours as needed for wheezing or shortness of breath.   aspirin EC 81 MG tablet Take 1 tablet (81 mg total) by mouth daily.   cyclobenzaprine (FLEXERIL) 10 MG tablet 1 tab po q hs prn muscle spams/scapula pain   Evolocumab (REPATHA SURECLICK) [id number] MG/ML SOAJ INJECT 140 MG INTO THE SKIN EVERY 14 DAYS.   ezetimibe (ZETIA) 10 MG tablet Take 1 tablet (10 mg total) by mouth daily.   methylPREDNISolone (MEDROL) 4 MG tablet Standard 6 day taper dose   metoprolol tartrate (LOPRESSOR) 25 MG tablet Take 1 tablet (25 mg total) by mouth daily.   nitroGLYCERIN (NITROSTAT) 0.4 MG SL tablet Place 1 tablet (0.4 mg total) under the tongue every 5 (five) minutes as needed for chest pain.   telmisartan (MICARDIS) 20 MG tablet Take 1 tablet (20 mg total) by mouth daily.     Allergies:   Pravastatin   Social History   Socioeconomic History   Marital status: Married    Spouse name: Not on file   Number of children: 2   Years of education: 16   Highest education level: Not on file  Occupational History   Occupation: Remodler  Tobacco Use   Smoking status: Never    Passive exposure: Never   Smokeless tobacco: Never  Vaping Use   Vaping Use: Never used  Substance and Sexual Activity   Alcohol use: Yes    Alcohol/week: 18.0 standard drinks of alcohol    Types: 18 Cans of beer per week    Comment: 03/16/2018 "2-3 beers daily"   Drug use: Not Currently   Sexual activity: Yes  Other Topics Concern   Not on file  Social History Narrative   Not on file   Social Determinants of Health   Financial Resource Strain: Not on file  Food Insecurity: Not on file  Transportation Needs: Not on file  Physical Activity: Not on file  Stress: Not on file  Social Connections: Not on file     Family History: The patient's family  history includes Autoimmune disease in his mother; Cancer (age of onset: 34) in his paternal uncle; Colon polyps in his maternal grandfather; Diabetes in his sister; Gout in his paternal grandfather; Healthy in his mother; Heart attack in his father and paternal grandfather; Mental illness in his maternal grandmother; Stroke in his paternal grandmother. There is no history of Colon cancer, Esophageal cancer, Rectal cancer, or Stomach cancer. ROS:   Please see the history of present illness.    All 14 point review of systems negative  except as described per history of present illness  EKGs/Labs/Other Studies Reviewed:      Recent Labs: 04/26/2022: Hemoglobin 15.1; Platelets 280 04/28/2022: ALT 48; BUN 15; Creatinine, Ser 0.92; Potassium 4.5; Sodium 138; TSH 2.25  Recent Lipid Panel    Component Value Date/Time   CHOL 131 04/28/2022 1247   CHOL 135 06/01/2020 0907   CHOL 178 01/15/2014 0801   TRIG 225.0 (H) 04/28/2022 1247   TRIG 233 (H) 01/15/2014 0801   HDL 75.00 04/28/2022 1247   HDL 68 06/01/2020 0907   HDL 62 01/15/2014 0801   CHOLHDL 2 04/28/2022 1247   VLDL 45.0 (H) 04/28/2022 1247   LDLCALC 37 01/25/2021 1126   LDLCALC 48 06/01/2020 0907   LDLCALC 69 01/15/2014 0801   LDLDIRECT 35.0 04/28/2022 1247    Physical Exam:    VS:  BP 122/84 (BP Location: Right Arm, Patient Position: Sitting)   Pulse 88   Ht '6\' 1"'$  (1.854 m)   Wt 178 lb 3.2 oz (80.8 kg)   SpO2 97%   BMI 23.51 kg/m     Wt Readings from Last 3 Encounters:  04/29/22 178 lb 3.2 oz (80.8 kg)  04/28/22 180 lb (81.6 kg)  04/26/22 179 lb (81.2 kg)     GEN:  Well nourished, well developed in no acute distress HEENT: Normal NECK: No JVD; No carotid bruits LYMPHATICS: No lymphadenopathy CARDIAC: RRR, no murmurs, no rubs, no gallops RESPIRATORY:  Clear to auscultation without rales, wheezing or rhonchi  ABDOMEN: Soft, non-tender, non-distended MUSCULOSKELETAL:  No edema; No deformity  SKIN: Warm and dry LOWER  EXTREMITIES: no swelling NEUROLOGIC:  Alert and oriented x 3 PSYCHIATRIC:  Normal affect   ASSESSMENT:    1. S/P CABG x 5   2. Mixed hyperlipidemia   3. Primary hypertension    PLAN:    In order of problems listed above:  Syncope and collapse.  I suspect that the constellation of some dehydration related to diarrhea day before, as well as the fact that he developing some cold-like symptoms.  However because of his advanced coronary artery disease we must rule out potential reactivation of the problem.  I will give him nitroglycerin as needed, he does afford the prescription, I will schedule him to have a exercise Cardiolite.  However I told him if next week he is still does not feel well to postpone stress test for another week.  I told him if he develops some chest pain again him to go to the emergency room. Dyslipidemia he is taking Repatha which I will continue I did review K PN which show me his LDL of 37 we will continue present management. Essential hypertension blood pressure well-controlled   Medication Adjustments/Labs and Tests Ordered: Current medicines are reviewed at length with the patient today.  Concerns regarding medicines are outlined above.  No orders of the defined types were placed in this encounter.  Medication changes: No orders of the defined types were placed in this encounter.   Signed, Park Liter, MD, [facility] 04/29/2022 2:17 PM    [facility]

## 2022-04-29 NOTE — Patient Instructions (Signed)
Medication Instructions:  Your physician recommends that you continue on your current medications as directed. Please refer to the Current Medication list given to you today.  *If you need a refill on your cardiac medications before your next appointment, please call your pharmacy*   Lab Work: None Ordered If you have labs (blood work) drawn today and your tests are completely normal, you will receive your results only by: [place] (if you have MyChart) OR A paper copy in the mail If you have any lab test that is abnormal or we need to change your treatment, we will call you to review the results.   Testing/Procedures: Your physician has requested that you have an exercise tolerance test.  Light Breakfast Dress to exercise   Follow-Up: At [facility], you and your health needs are our priority.  As part of our continuing mission to provide you with exceptional heart care, we have created designated Provider Care Teams.  These Care Teams include your primary Cardiologist (physician) and Advanced Practice Providers (APPs -  Physician Assistants and Nurse Practitioners) who all work together to provide you with the care you need, when you need it.  We recommend signing up for the patient portal called "MyChart".  Sign up information is provided on this After Visit Summary.  MyChart is used to connect with patients for Virtual Visits (Telemedicine).  Patients are able to view lab/test results, encounter notes, upcoming appointments, etc.  Non-urgent messages can be sent to your provider as well.   To learn more about what you can do with MyChart, go to [email or web address].    Your next appointment:    Follow up with Dr. Bettina Gavia as scheduled  The format for your next appointment:   In Person  Provider:   Jenne Campus, MD    Other Instructions NA

## 2022-05-03 ENCOUNTER — Telehealth (HOSPITAL_COMMUNITY): Payer: Self-pay | Admitting: *Deleted

## 2022-05-03 NOTE — Telephone Encounter (Signed)
Instructions given for upcoming GXT.  Tim Strickland

## 2022-05-05 ENCOUNTER — Ambulatory Visit: Payer: Managed Care, Other (non HMO)

## 2022-05-10 ENCOUNTER — Ambulatory Visit: Payer: Managed Care, Other (non HMO) | Attending: Cardiology

## 2022-05-10 ENCOUNTER — Other Ambulatory Visit: Payer: Self-pay | Admitting: Cardiology

## 2022-05-10 DIAGNOSIS — Z951 Presence of aortocoronary bypass graft: Secondary | ICD-10-CM | POA: Diagnosis not present

## 2022-05-11 LAB — EXERCISE TOLERANCE TEST
Angina Index: 0
Estimated workload: 12.5
Exercise duration (min): 10 min
Exercise duration (sec): 30 s
MPHR: 164 {beats}/min
Peak HR: 166 {beats}/min
Percent HR: 101 %
Rest HR: 70 {beats}/min

## 2022-05-12 ENCOUNTER — Telehealth: Payer: Self-pay

## 2022-05-12 ENCOUNTER — Ambulatory Visit (HOSPITAL_BASED_OUTPATIENT_CLINIC_OR_DEPARTMENT_OTHER)
Admission: RE | Admit: 2022-05-12 | Discharge: 2022-05-12 | Disposition: A | Discharge status: Home / Self Care | Payer: Managed Care, Other (non HMO) | Source: Ambulatory Visit | Attending: Medical | Admitting: Medical

## 2022-05-12 DIAGNOSIS — G4489 Other headache syndrome: Secondary | ICD-10-CM

## 2022-05-12 DIAGNOSIS — R55 Syncope and collapse: Secondary | ICD-10-CM | POA: Diagnosis present

## 2022-05-12 NOTE — Telephone Encounter (Signed)
Received call from [place]- wanted to call report below. CT was normal    IMPRESSION: Normal head CT.

## 2022-05-13 NOTE — Telephone Encounter (Signed)
Reviewed by DOD and PCP. PCP resulted imaging and patient reviewed via Mychart

## 2022-05-16 ENCOUNTER — Encounter: Payer: Self-pay | Admitting: Cardiology

## 2022-06-05 NOTE — Progress Notes (Signed)
Cardiology Office Note:    Date:  06/06/2022   ID:  Tim Strickland, DOB May 10, 1965, MRN [id number]  PCP:  Esperanza Richters, PA-C  Cardiologist:  Norman Herrlich, MD    Referring MD: Esperanza Richters, PA-C    ASSESSMENT:    1. Syncope and collapse   2. Primary hypertension   3. S/P CABG x 5   4. Mixed hyperlipidemia    PLAN:    In order of problems listed above:  Remains unsettled regarding syncope and request further evaluation I agree with the need benefit from a monitor will apply a live telemetry monitor will also discontinue his ARB is he may be having symptomatic hypotension Seen back in several weeks at this is unrevealing he may benefit from diagnostic coronary angiography for reassurance Continue his lipid-lowering treatment   Next appointment: 3 weeks   Medication Adjustments/Labs and Tests Ordered: Current medicines are reviewed at length with the patient today.  Concerns regarding medicines are outlined above.  No orders of the defined types were placed in this encounter.  No orders of the defined types were placed in this encounter.   No chief complaint on file.   History of Present Illness:    Tim Strickland is a 57 y.o. male with a hx of CAD with CABG January 2020 hypertension and familial hyperlipidemia last seen 05/27/2021.  He was recently seen 04/29/2022 by my partner after a syncopal episode.  He did quite well achieving 4 stages of Bruce protocol 12.5 METS EKG normal to 101% of predicted heart rate normal blood pressure response no chest pain or arrhythmia.  Compliance with diet, lifestyle and medications: Yes  He is on settled and does not feel as if he has had an answer for the reason he lost consciousness He has been concerned since he had bypass surgery about graft occlusion he is having nonexertional intermittent discomfort in the left shoulder area and into his neck. For further evaluation will use a 2-week live monitor and if unrevealing I  have asked him to consider diagnostic coronary angiography despite a low risk treadmill stress test He checks blood pressure at home and has had systolic blood pressures of less than 110.  His ARB be discontinued Past Medical History:  Diagnosis Date   Asthma 12/05/2008   Qualifier: Diagnosis of  By: Alwyn Ren MD, William   Onset @ age 62 after environmental exposures (home built in 15s). No  maintenance agents; Rx: avoidance of triggers (paint fumes, dust) Singulair & albuterol prn     Bursitis of right shoulder 01/15/2014   Ultrasound guided injection 01/15/2014    CAD (coronary artery disease), native coronary artery 01/25/2018   Coronary artery disease    Elevated liver enzymes    PMH of  on Pravachol   Encounter for general adult medical examination with abnormal findings 11/04/2016   Environmental asthma    Familial hyperlipidemia 12/05/2008   Qualifier: Diagnosis of  By: Alwyn Ren MD, Lynder Parents Lipoprofile 2006: LDL 162 ( 1985/ 1351), HDL 51, TG 108.  NMR Lipoprofile 01/2014 : LDL 69 ( 885 / 601  ),  HDL 62 , Triglycerides 233 . LP-IR 62. LDL goal= < 110, ideally < 80. Father MI @ before 36; PGF MI in 75s. PGM CVA in 60s Note: previously on Pravastatin 20 mg ; stopped because  mild hepatic enzyme rise. [facility] Panel 01/2011: NOT h   HTN (hypertension) 03/18/2018   Hyperglycemia    A1c 5.2% in 2010  Hyperlipidemia    Hypertension    Keloid of skin 07/24/2018   Left wrist tendinitis 02/23/2016   Meralgia paraesthetica, left 04/19/2018   Paresthesia 07/24/2018   Right rotator cuff tear 04/26/2016   Injected A 17 2018.   S/P CABG x 5    RIMA to LAD LIMA to Diagonal 2 SVG to Diagonal 1 Sequential SVG to PD and PLB    Shoulder impingement syndrome, right 02/23/2016   Synovitis of toe 02/23/2016    Past Surgical History:  Procedure Laterality Date   APPENDECTOMY     CORONARY ARTERY BYPASS GRAFT N/A 03/19/2018   Procedure: CORONARY ARTERY BYPASS GRAFTING (CABG), ON PUMP, TIMES FIVE,  USING BILATERAL INTERNAL MAMMARY ARTERIES AND ENDOSCOPICALLY HARVESTED RIGHT GREATER SAPHENOUS VEIN;  Surgeon: Alleen Borne, MD;  Location: [facility] OR;  Service: Open Heart Surgery;  Laterality: N/A;   LEFT HEART CATH AND CORONARY ANGIOGRAPHY N/A 03/16/2018   Procedure: LEFT HEART CATH AND CORONARY ANGIOGRAPHY;  Surgeon: Iran Ouch, MD;  Location: [facility] INVASIVE CV LAB;  Service: Cardiovascular;  Laterality: N/A;   TEE WITHOUT CARDIOVERSION N/A 03/19/2018   Procedure: TRANSESOPHAGEAL ECHOCARDIOGRAM (TEE);  Surgeon: Alleen Borne, MD;  Location: [facility] OR;  Service: Open Heart Surgery;  Laterality: N/A;   TONSILLECTOMY     VASECTOMY     WISDOM TOOTH EXTRACTION      Current Medications: Current Meds  Medication Sig   albuterol (PROVENTIL HFA;VENTOLIN HFA) 108 (90 Base) MCG/ACT inhaler Inhale 2 puffs into the lungs every 6 (six) hours as needed for wheezing or shortness of breath.   aspirin EC 81 MG tablet Take 1 tablet (81 mg total) by mouth daily.   cyclobenzaprine (FLEXERIL) 10 MG tablet 1 tab po q hs prn muscle spams/scapula pain   ezetimibe (ZETIA) 10 MG tablet Take 1 tablet (10 mg total) by mouth daily.   methylPREDNISolone (MEDROL) 4 MG tablet Standard 6 day taper dose   metoprolol tartrate (LOPRESSOR) 25 MG tablet TAKE 1 TABLET (25 MG TOTAL) BY MOUTH DAILY.   nitroGLYCERIN (NITROSTAT) 0.4 MG SL tablet Place 1 tablet (0.4 mg total) under the tongue every 5 (five) minutes as needed for chest pain.   telmisartan (MICARDIS) 20 MG tablet Take 1 tablet (20 mg total) by mouth daily.   [DISCONTINUED] Evolocumab (REPATHA SURECLICK) 140 MG/ML SOAJ INJECT 140 MG INTO THE SKIN EVERY 14 DAYS.     Allergies:   Pravastatin   Social History   Socioeconomic History   Marital status: Married    Spouse name: Not on file   Number of children: 2   Years of education: 16   Highest education level: Not on file  Occupational History   Occupation: Remodler  Tobacco Use   Smoking status: Never     Passive exposure: Never   Smokeless tobacco: Never  Vaping Use   Vaping Use: Never used  Substance and Sexual Activity   Alcohol use: Yes    Alcohol/week: 18.0 standard drinks of alcohol    Types: 18 Cans of beer per week    Comment: 03/16/2018 "2-3 beers daily"   Drug use: Not Currently   Sexual activity: Yes  Other Topics Concern   Not on file  Social History Narrative   Not on file   Social Determinants of Health   Financial Resource Strain: Not on file  Food Insecurity: Not on file  Transportation Needs: Not on file  Physical Activity: Not on file  Stress: Not on file  Social Connections: Not  on file     Family History: The patient's family history includes Autoimmune disease in his mother; Cancer (age of onset: 54) in his paternal uncle; Colon polyps in his maternal grandfather; Diabetes in his sister; Gout in his paternal grandfather; Healthy in his mother; Heart attack in his father and paternal grandfather; Mental illness in his maternal grandmother; Stroke in his paternal grandmother. There is no history of Colon cancer, Esophageal cancer, Rectal cancer, or Stomach cancer. ROS:   Please see the history of present illness.    All other systems reviewed and are negative.  EKGs/Labs/Other Studies Reviewed:    The following studies were reviewed today:  Cardiac Studies & Procedures   CARDIAC CATHETERIZATION  CARDIAC CATHETERIZATION 03/16/2018  Narrative  The left ventricular systolic function is normal.  LV end diastolic pressure is normal.  The left ventricular ejection fraction is 55-65% by visual estimate.  Prox RCA lesion is 40% stenosed.  Dist RCA lesion is 65% stenosed.  Post Atrio lesion is 40% stenosed.  Prox Cx lesion is 40% stenosed.  Ost LAD lesion is 90% stenosed.  Prox LAD-1 lesion is 99% stenosed.  Prox LAD-2 lesion is 50% stenosed.  Ost 1st Diag to 1st Diag lesion is 50% stenosed.  1.  Severe two-vessel coronary artery disease  involving the ostial LAD as well as proximal LAD at the bifurcation of a large diagonal branch, distal RCA disease as well.  All these lesions were significant by CT FFR.  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 2.  Normal LV systolic function and normal  left ventricular end-diastolic pressure.  Recommendations: Given the involvement of ostial LAD as well as bifurcation LAD stenosis at the origin of very large diagonal branch that has borderline significant proximal disease, I think CABG is a better long-term option. Given severity of disease and symptoms, the patient will be admitted to the hospital and start unfractionated heparin 8 hours after sheath pull. I consulted CVTS for CABG.  Findings Coronary Findings Diagnostic  Dominance: Right  Left Anterior Descending Ost LAD lesion is 90% stenosed. Prox LAD-1 lesion is 99% stenosed. Prox LAD-2 lesion is 50% stenosed.  First Diagonal Branch Vessel is large in size. Ost 1st Diag to 1st Diag lesion is 50% stenosed.  Second Diagonal Branch Vessel is small in size.  Left Circumflex Prox Cx lesion is 40% stenosed.  Right Coronary Artery Prox RCA lesion is 40% stenosed. Dist RCA lesion is 65% stenosed.  Right Posterior Atrioventricular Artery Post Atrio lesion is 40% stenosed.  Intervention  No interventions have been documented.   STRESS TESTS  EXERCISE TOLERANCE TEST (ETT) 05/11/2022  Narrative   Normal ETT. Very good exercise capacity   ECHOCARDIOGRAM  ECHOCARDIOGRAM COMPLETE 03/16/2018  Narrative *[place]* *[facility]* [address] [place], [place] [address] [phone]  ------------------------------------------------------------------- Transthoracic Echocardiography  Patient:    Mcarther, Malley MR #:       [id number] Study Date: 03/16/2018 Gender:     M Age:        52 Height:     185.4 cm Weight:     82.6 kg BSA:        2.06  m^2 Pt. Status: Room:       6E04C  ORDERING     Evelene Croon, MD REFERRING    Evelene Croon, MD ADMITTING    Lorine Bears, MD SONOGRAPHER  [facility] ATTENDING    Lance Muss S PERFORMING  [facility], Inpatient  cc:  ------------------------------------------------------------------- LV EF: 50% -   55%  ------------------------------------------------------------------- Indications:      CAD of native vessels 414.01.  ------------------------------------------------------------------- History:   PMH:   Angina pectoris.  Risk factors:  Dyslipidemia.  ------------------------------------------------------------------- Study Conclusions  - Left ventricle: The cavity size was normal. There was mild concentric hypertrophy. Systolic function was at the lower limits of normal. The estimated ejection fraction was in the range of 50% to 55%. Hypokinesis of the apicallateral and apical myocardium; consistent with ischemia in the distribution of the left anterior descending coronary artery. Left ventricular diastolic function parameters were normal.  ------------------------------------------------------------------- Study data:  No prior study was available for comparison.  Study status:  Routine.  Procedure:  Transthoracic echocardiography. Image quality was adequate.          Transthoracic echocardiography.  M-mode, complete 2D, spectral Doppler, and color Doppler.  Birthdate:  Patient birthdate: [birth year].  Age:  Patient is 57 yr old.  Sex:  Gender: male.    BMI: 24 kg/m^2.  Blood pressure:     165/103  Patient status:  Inpatient.  Study date: Study date: 03/16/2018. Study time: 03:26 PM.  Location:  Bedside.  -------------------------------------------------------------------  ------------------------------------------------------------------- Left ventricle:  The cavity size was normal. There was mild concentric hypertrophy. Systolic function was at the lower  limits of normal. The estimated ejection fraction was in the range of 50% to 55%.  Regional wall motion abnormalities:   Hypokinesis of the apicallateral and apical myocardium; consistent with ischemia in the distribution of the left anterior descending coronary artery. The transmitral flow pattern was normal. The deceleration time of the early transmitral flow velocity was normal. The pulmonary vein flow pattern was normal. The tissue Doppler parameters were normal. Left ventricular diastolic function parameters were normal.  ------------------------------------------------------------------- Aortic valve:   Trileaflet; normal thickness leaflets. Mobility was not restricted.  Doppler:  Transvalvular velocity was within the normal range. There was no stenosis. There was no regurgitation.  ------------------------------------------------------------------- Aorta:  Aortic root: The aortic root was normal in size.  ------------------------------------------------------------------- Mitral valve:   Structurally normal valve.   Mobility was not restricted.  Doppler:  Transvalvular velocity was within the normal range. There was no evidence for stenosis. There was no regurgitation.  ------------------------------------------------------------------- Left atrium:  The atrium was normal in size.  ------------------------------------------------------------------- Right ventricle:  The cavity size was normal. Wall thickness was normal. Systolic function was normal.  ------------------------------------------------------------------- Pulmonic valve:    Structurally normal valve.   Cusp separation was normal.  Doppler:  Transvalvular velocity was within the normal range. There was no evidence for stenosis. There was no regurgitation.  ------------------------------------------------------------------- Tricuspid valve:   Structurally normal valve.    Doppler: Transvalvular velocity was  within the normal range. There was no regurgitation.  ------------------------------------------------------------------- Pulmonary artery:   The main pulmonary artery was normal-sized. Systolic pressure was within the normal range.  ------------------------------------------------------------------- Right atrium:  The atrium was normal in size.  ------------------------------------------------------------------- Pericardium:  There was no pericardial effusion.  ------------------------------------------------------------------- Systemic veins: Inferior vena cava: The vessel was normal in size.  ------------------------------------------------------------------- Measurements  Left ventricle                         Value        Reference LV ID, ED, PLAX chordal                48    mm     43 -  52 LV ID, ES, PLAX chordal                34    mm     23 - 38 LV fx shortening, PLAX chordal         29    %      >=29 LV PW thickness, ED                    13    mm     ---------- IVS/LV PW ratio, ED                    0.92         <=1.3 LV e&', lateral                         12    cm/s   ---------- LV E/e&', lateral                       4.18         ---------- LV e&', medial                          9.57  cm/s   ---------- LV E/e&', medial                        5.24         ---------- LV e&', average                         10.79 cm/s   ---------- LV E/e&', average                       4.65         ----------  Ventricular septum                     Value        Reference IVS thickness, ED                      12    mm     ----------  LVOT                                   Value        Reference LVOT ID, S                             21    mm     ---------- LVOT area                              3.46  cm^2   ----------  Aorta                                  Value        Reference Aortic root ID, ED                     34    mm     ----------  Left atrium  Value        Reference LA ID, A-P, ES                         36    mm     ---------- LA ID/bsa, A-P                         1.74  cm/m^2 <=2.2 LA volume, S                           34.6  ml     ---------- LA volume/bsa, S                       16.8  ml/m^2 ---------- LA volume, ES, 1-p A4C                 41.6  ml     ---------- LA volume/bsa, ES, 1-p A4C             20.1  ml/m^2 ---------- LA volume, ES, 1-p A2C                 28.8  ml     ---------- LA volume/bsa, ES, 1-p A2C             13.9  ml/m^2 ----------  Mitral valve                           Value        Reference Mitral E-wave peak velocity            50.1  cm/s   ---------- Mitral A-wave peak velocity            41.1  cm/s   ---------- Mitral deceleration time       (H)     299   ms     150 - 230 Mitral E/A ratio, peak                 1.2          ----------  Right atrium                           Value        Reference RA ID, S-I, ES, A4C                    43.8  mm     34 - 49 RA area, ES, A4C                       12.3  cm^2   8.3 - 19.5 RA volume, ES, A/L                     27.4  ml     ---------- RA volume/bsa, ES, A/L                 13.3  ml/m^2 ----------  Systemic veins                         Value        Reference Estimated CVP  3     mm Hg  ----------  Right ventricle                        Value        Reference TAPSE                                  22.3  mm     ---------- RV s&', lateral, S                      11.5  cm/s   ----------  Legend: (L)  and  (H)  mark values outside specified reference range.  ------------------------------------------------------------------- Prepared and Electronically Authenticated by  Thurmon Fair, MD 2020-01-10T16:43:13   TEE  ECHO INTRAOPERATIVE TEE 03/19/2018  Interpretation Summary  Mitral valve: The mitral leaflets opened normally without restriction. There was normal leaflet thickness. There was trace mitral insufficiency. On  the post bypass exam, the mitral valve appeared unchanged from the pre-bypass study. The valve opened normally and there was trace mitral insufficiency.  Right ventricle: The right ventricular cavity was normal in size. There was normal-appearing right ventricular systolic function. The TAPSE was measured at 21.3 mm.  Tricuspid valve: The tricuspid valve appeared structurally normal. There was trace tricuspid insufficiency.  Pulmonic valve: The pulmonic valve leaflets appeared to open normally. There was trace pulmonic insufficiency.    CT SCANS  CT CORONARY MORPH W/CTA COR W/SCORE 03/13/2018  Addendum 03/13/2018  7:19 AM ADDENDUM REPORT: 03/13/2018 07:16  CLINICAL DATA:  Chest pain  EXAM: Cardiac CTA  MEDICATIONS: Sub lingual nitro. 4mg  x 2  TECHNIQUE: The patient was scanned on a Siemens 192 slice scanner. Gantry rotation speed was 250 msecs. Collimation was 0.6 mm. A 100 kV prospective scan was triggered in the ascending thoracic aorta at 35-75% of the R-R interval. Average HR during the scan was 60 bpm. The 3D data set was interpreted on a dedicated work station using MPR, MIP and VRT modes. A total of 80cc of contrast was used.  FINDINGS: Non-cardiac: See separate report from [facility].  The pulmonary veins drain normally to the left atrium.  Calcium Score: 166 Agatston units.  Coronary Arteries: Right dominant.  LM: No left main given anomalous LCx.  LAD system: Mixed plaque with two severe serial (70-90%) stenoses in the proximal LAD. A moderate 2nd diagonal appears to be involved ostially by the 2nd LAD stenosis and also appears to have moderate (51-69%) stenosis in the proximal vessel.  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 The vessel is relatively small without apparent significant stenosis.  RCA system: Mixed plaque with mild (<50%) stenosis  proximal RCA. Soft plaque with possible severe (70-90%) stenosis distal RCA.  IMPRESSION: 1. Coronary artery calcium score 166 Agatston units. This places the patient in the 90th percentile for age and gender.  2. 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 I do not think that this is a malignant anomaly. The vessel is small without apparent significant disease.  3.  Suspect severe (70-90%) stenosis in the distal RCA.  4. Suspect 2 serial severe (70-90%) stenoses in the proximal LAD. The 2nd diagonal also appears to be significantly disease.  Will send for FFR.  [employer]   Electronically Signed By: Freida Busman  Mclean M.D. On: 03/13/2018 07:16  Narrative EXAM: OVER-READ INTERPRETATION  CT CHEST  The following report is an over-read performed by radiologist Dr. Charlett Nose of [facility] on 03/12/2018. This over-read does not include interpretation of cardiac or coronary anatomy or pathology. The coronary CTA interpretation by the cardiologist is attached.  COMPARISON:  None.  FINDINGS: Vascular: Heart is normal size.  Visualized aorta is normal caliber.  Mediastinum/Nodes: No adenopathy in the lower mediastinum or hila.  Lungs/Pleura: Visualized lungs clear.  No effusions.  Upper Abdomen: Imaging into the upper abdomen shows no acute findings.  Musculoskeletal: Chest wall soft tissues are unremarkable. No acute bony abnormality.  IMPRESSION: No acute or significant extracardiac abnormality.  Electronically Signed: By: Charlett Nose M.D. On: 03/12/2018 08:58          EKG:   'EKG 05/10/2022 showed sinus rhythm left posterior fascicular block RSR prime but did not have criteria for right bundle branch  Recent Labs: 04/26/2022: Hemoglobin 15.1; Platelets 280 04/28/2022: ALT 48; BUN 15; Creatinine, Ser 0.92; Potassium 4.5; Sodium 138; TSH 2.25  Recent Lipid Panel    Component Value Date/Time   CHOL  131 04/28/2022 1247   CHOL 135 06/01/2020 0907   CHOL 178 01/15/2014 0801   TRIG 225.0 (H) 04/28/2022 1247   TRIG 233 (H) 01/15/2014 0801   HDL 75.00 04/28/2022 1247   HDL 68 06/01/2020 0907   HDL 62 01/15/2014 0801   CHOLHDL 2 04/28/2022 1247   VLDL 45.0 (H) 04/28/2022 1247   LDLCALC 37 01/25/2021 1126   LDLCALC 48 06/01/2020 0907   LDLCALC 69 01/15/2014 0801   LDLDIRECT 35.0 04/28/2022 1247    Physical Exam:    VS:  BP 112/78 (BP Location: Left Arm, Patient Position: Sitting)   Pulse 84   Ht 6' (1.829 m)   Wt 183 lb (83 kg)   SpO2 97%   BMI 24.82 kg/m     Wt Readings from Last 3 Encounters:  06/06/22 183 lb (83 kg)  04/29/22 178 lb 3.2 oz (80.8 kg)  04/28/22 180 lb (81.6 kg)     GEN:  Well nourished, well developed in no acute distress HEENT: Normal NECK: No JVD; No carotid bruits LYMPHATICS: No lymphadenopathy CARDIAC: RRR, no murmurs, rubs, gallops RESPIRATORY:  Clear to auscultation without rales, wheezing or rhonchi  ABDOMEN: Soft, non-tender, non-distended MUSCULOSKELETAL:  No edema; No deformity  SKIN: Warm and dry NEUROLOGIC:  Alert and oriented x 3 PSYCHIATRIC:  Normal affect    Signed, Norman Herrlich, MD  06/06/2022 3:24 PM    [facility]

## 2022-06-06 ENCOUNTER — Ambulatory Visit: Payer: Managed Care, Other (non HMO) | Attending: Cardiology | Admitting: Cardiology

## 2022-06-06 ENCOUNTER — Other Ambulatory Visit: Payer: Self-pay

## 2022-06-06 ENCOUNTER — Ambulatory Visit: Payer: Managed Care, Other (non HMO) | Attending: Cardiology

## 2022-06-06 ENCOUNTER — Encounter: Payer: Self-pay | Admitting: Cardiology

## 2022-06-06 VITALS — BP 112/78 | HR 84 | Ht 72.0 in | Wt 183.0 lb

## 2022-06-06 DIAGNOSIS — R55 Syncope and collapse: Secondary | ICD-10-CM

## 2022-06-06 DIAGNOSIS — I10 Essential (primary) hypertension: Secondary | ICD-10-CM

## 2022-06-06 DIAGNOSIS — E782 Mixed hyperlipidemia: Secondary | ICD-10-CM

## 2022-06-06 DIAGNOSIS — Z951 Presence of aortocoronary bypass graft: Secondary | ICD-10-CM

## 2022-06-06 MED ORDER — REPATHA SURECLICK 140 MG/ML ~~LOC~~ SOAJ: SUBCUTANEOUS | 3 refills | Status: DC

## 2022-06-06 NOTE — Patient Instructions (Signed)
Medication Instructions:  Your physician has recommended you make the following change in your medication:   STOP: Telmisartan  *If you need a refill on your cardiac medications before your next appointment, please call your pharmacy*   Lab Work: None If you have labs (blood work) drawn today and your tests are completely normal, you will receive your results only by: [place] (if you have MyChart) OR A paper copy in the mail If you have any lab test that is abnormal or we need to change your treatment, we will call you to review the results.   Testing/Procedures: A zio monitor was ordered today. It will remain on for 14 days. You will then return monitor and event diary in provided box. It takes 1-2 weeks for report to be downloaded and returned to [place]. We will call you with the results. If monitor falls off or has orange flashing light, please call [employer] for further instructions.     Follow-Up: At [facility], you and your health needs are our priority.  As part of our continuing mission to provide you with exceptional heart care, we have created designated Provider Care Teams.  These Care Teams include your primary Cardiologist (physician) and Advanced Practice Providers (APPs -  Physician Assistants and Nurse Practitioners) who all work together to provide you with the care you need, when you need it.  We recommend signing up for the patient portal called "MyChart".  Sign up information is provided on this After Visit Summary.  MyChart is used to connect with patients for Virtual Visits (Telemedicine).  Patients are able to view lab/test results, encounter notes, upcoming appointments, etc.  Non-urgent messages can be sent to your provider as well.   To learn more about what you can do with MyChart, go to [email or web address].    Your next appointment:   3 week(s)  Provider:   Shirlee More, MD    Other Instructions None

## 2022-06-15 ENCOUNTER — Other Ambulatory Visit: Payer: Self-pay | Admitting: Cardiology

## 2022-06-15 NOTE — Telephone Encounter (Signed)
Rx denied, medication was d/c 06/06/2022

## 2022-06-17 ENCOUNTER — Telehealth: Payer: Self-pay | Admitting: Cardiology

## 2022-06-17 NOTE — Telephone Encounter (Signed)
Called [employer] and spoke with Destiny and she reported that they had not been getting good reading from the patient's heart monitor since 4/9. Called and spoke to the patient and he reported that Dr. Dulce Sellar had advised him to take the heart monitor off and send it back to [facility] before he left on his trip. He stated that he had done what Dr. Dulce Sellar recommended. Informed the patient that he had done the right thing. Called Destiny at [facility] and updated her on the status of the heart monitor. Destiny had no further questions at this time.

## 2022-06-17 NOTE — Telephone Encounter (Signed)
Caller wanted team to know that patient's leads are off and according to tracking the device is being returned.  Case Ref# [id number].

## 2022-06-21 ENCOUNTER — Encounter: Payer: Self-pay | Admitting: Cardiology

## 2022-06-23 ENCOUNTER — Encounter: Payer: Self-pay | Admitting: Cardiology

## 2022-06-23 ENCOUNTER — Other Ambulatory Visit: Payer: Self-pay

## 2022-06-23 ENCOUNTER — Ambulatory Visit: Payer: Managed Care, Other (non HMO) | Attending: Cardiology | Admitting: Cardiology

## 2022-06-23 VITALS — BP 138/102 | HR 86 | Ht 72.0 in | Wt 182.8 lb

## 2022-06-23 DIAGNOSIS — Z951 Presence of aortocoronary bypass graft: Secondary | ICD-10-CM | POA: Diagnosis not present

## 2022-06-23 DIAGNOSIS — I10 Essential (primary) hypertension: Secondary | ICD-10-CM | POA: Diagnosis not present

## 2022-06-23 DIAGNOSIS — R55 Syncope and collapse: Secondary | ICD-10-CM | POA: Diagnosis not present

## 2022-06-23 DIAGNOSIS — I779 Disorder of arteries and arterioles, unspecified: Secondary | ICD-10-CM

## 2022-06-23 DIAGNOSIS — I2510 Atherosclerotic heart disease of native coronary artery without angina pectoris: Secondary | ICD-10-CM

## 2022-06-23 DIAGNOSIS — E782 Mixed hyperlipidemia: Secondary | ICD-10-CM

## 2022-06-23 MED ORDER — TELMISARTAN 20 MG PO TABS: 10.0000 mg | ORAL_TABLET | Freq: Every day | ORAL | 3 refills | Status: DC

## 2022-06-23 NOTE — Progress Notes (Signed)
Cardiology Office Note:    Date:  06/23/2022   ID:  Tim Strickland, DOB September 04, 1965, MRN [id number]  PCP:  Marisue Brooklyn   [facility] Providers Cardiologist:  Norman Herrlich, MD     Referring MD: Marisue Brooklyn   CC: follow up CAD  History of Present Illness:    Tim Strickland is a 57 y.o. male with a hx of CAD s/p CABG x 5, hypertension, asthma, syncope and collapse, hyperlipidemia.  [facility] established with [facility] in 2019 for exertional angina.  He had a coronary CTA which revealed a calcium score of 166 placing him in the 90th percentile for age and gender.  FFR suggested hemodynamically significant distal RCA stenosis, severe proximal LAD stenosis, and severe proximal diagonal stenosis.  He underwent LHC on 03/16/2018 showing severe two-vessel CAD involving the ostial LAD as well as proximal LAD that was not amenable to PCI.  Echo at this time revealed an EF of 50 to 55%, hypokinesis of the apical apical lateral myocardium consistent with ischemia in the distribution of the LAD.  Carotid ultrasound at this time showed minimal wall thickening bilaterally.  He underwent CABG x 5, LIMA to diagonal 2, RIMA to LAD, SVG to diagonal 1, sequential SVG to PL and PDA with endoscopic harvest of greater saphenous vein from the right leg.  He was evaluated in the emergency department on 04/26/2022 for syncopal episode.  He reported that he woke up feeling weak, had 4 episodes of diarrhea, then went hiking with a friend.  He apparently became clammy complained of chest pain and subsequently passed out for approximately 30 seconds.  EKG was normal, as were his troponin.  CBC without anemia, BMET without electrolyte abnormalities.  He had a second event approximately 6 days after this, he reports that he felt like his vision was going black he went to sit down on the couch, wife witnessed him unresponsive for approximately 30 seconds.  In between these 2 events he states he had flulike  symptoms, was in the bed for the majority of these days, only able to keep down clear liquids.  Most recently he was evaluated by Dr. Dulce Sellar on 06/06/2022 following his recent syncopal event.  Decision was made to proceed with wearing a live monitor, his ARB was discontinued secondary to symptomatic hypotension.  He presents today for follow-up of his recent monitor results, they have not been formally resulted however conferred with Dr. Bing Matter and results appear to be normal.  Formal result to follow.   Tim Strickland reports that he has been doing okay, he continues to be very active both at work and in his personal life.  He was most recently in [place] for a concert and was walking up to 20,000 steps per day.  He has been checking his blood pressure at home, and his blood pressure readings are mostly elevated, 130-160 systolic over 90-100 diastolic.  He is able to be very active with his day-to-day activities.  He does report that he has a pain/sensation in his left upper chest medially to his shoulder, it is hard for him to describe what it feels like but it does feel as if it moves to his back behind his scapula.  Manual palpation behind his scapula is exquisitely tender, and he states he feels like he may have a knot there. He denies chest pain, palpitations, dyspnea, pnd, orthopnea, n, v, dizziness, syncope, edema, weight gain, or early satiety.   Past Medical History:  Diagnosis Date   Asthma 12/05/2008   Qualifier: Diagnosis of  By: Alwyn Ren MD, Chrissie Noa   Onset @ age 44 after environmental exposures (home built in 53s). No  maintenance agents; Rx: avoidance of triggers (paint fumes, dust) Singulair & albuterol prn     Bursitis of right shoulder 01/15/2014   Ultrasound guided injection 01/15/2014    CAD (coronary artery disease), native coronary artery 01/25/2018   Coronary artery disease    Elevated liver enzymes    PMH of  on Pravachol   Encounter for general adult medical examination  with abnormal findings 11/04/2016   Environmental asthma    Familial hyperlipidemia 12/05/2008   Qualifier: Diagnosis of  By: Alwyn Ren MD, Lynder Parents Lipoprofile 2006: LDL 162 ( 1985/ 1351), HDL 51, TG 108.  NMR Lipoprofile 01/2014 : LDL 69 ( 885 / 601  ),  HDL 62 , Triglycerides 233 . LP-IR 62. LDL goal= < 110, ideally < 80. Father MI @ before 55; PGF MI in 8s. PGM CVA in 60s Note: previously on Pravastatin 20 mg ; stopped because  mild hepatic enzyme rise. [facility] Panel 01/2011: NOT h   HTN (hypertension) 03/18/2018   Hyperglycemia    A1c 5.2% in 2010   Hyperlipidemia    Hypertension    Keloid of skin 07/24/2018   Left wrist tendinitis 02/23/2016   Meralgia paraesthetica, left 04/19/2018   Paresthesia 07/24/2018   Right rotator cuff tear 04/26/2016   Injected A 17 2018.   S/P CABG x 5    RIMA to LAD LIMA to Diagonal 2 SVG to Diagonal 1 Sequential SVG to PD and PLB    Shoulder impingement syndrome, right 02/23/2016   Synovitis of toe 02/23/2016    Past Surgical History:  Procedure Laterality Date   APPENDECTOMY     CORONARY ARTERY BYPASS GRAFT N/A 03/19/2018   Procedure: CORONARY ARTERY BYPASS GRAFTING (CABG), ON PUMP, TIMES FIVE, USING BILATERAL INTERNAL MAMMARY ARTERIES AND ENDOSCOPICALLY HARVESTED RIGHT GREATER SAPHENOUS VEIN;  Surgeon: Alleen Borne, MD;  Location: [facility] OR;  Service: Open Heart Surgery;  Laterality: N/A;   LEFT HEART CATH AND CORONARY ANGIOGRAPHY N/A 03/16/2018   Procedure: LEFT HEART CATH AND CORONARY ANGIOGRAPHY;  Surgeon: Iran Ouch, MD;  Location: [facility] INVASIVE CV LAB;  Service: Cardiovascular;  Laterality: N/A;   TEE WITHOUT CARDIOVERSION N/A 03/19/2018   Procedure: TRANSESOPHAGEAL ECHOCARDIOGRAM (TEE);  Surgeon: Alleen Borne, MD;  Location[facility];  Service: Open Heart Surgery;  Laterality: N/A;   TONSILLECTOMY     VASECTOMY     WISDOM TOOTH EXTRACTION      Current Medications: Current Meds  Medication Sig   albuterol (PROVENTIL HFA;VENTOLIN HFA)  108 (90 Base) MCG/ACT inhaler Inhale 2 puffs into the lungs every 6 (six) hours as needed for wheezing or shortness of breath.   aspirin EC 81 MG tablet Take 1 tablet (81 mg total) by mouth daily.   cyclobenzaprine (FLEXERIL) 10 MG tablet 1 tab po q hs prn muscle spams/scapula pain   Evolocumab (REPATHA SURECLICK) 140 MG/ML SOAJ INJECT 140 MG INTO THE SKIN EVERY 14 DAYS.   ezetimibe (ZETIA) 10 MG tablet Take 1 tablet (10 mg total) by mouth daily.   methylPREDNISolone (MEDROL) 4 MG tablet Standard 6 day taper dose   metoprolol tartrate (LOPRESSOR) 25 MG tablet TAKE 1 TABLET (25 MG TOTAL) BY MOUTH DAILY.   nitroGLYCERIN (NITROSTAT) 0.4 MG SL tablet Place 1 tablet (0.4 mg total) under the tongue every 5 (five) minutes  as needed for chest pain.     Allergies:   Pravastatin   Social History   Socioeconomic History   Marital status: Married    Spouse name: Not on file   Number of children: 2   Years of education: 16   Highest education level: Not on file  Occupational History   Occupation: Remodler  Tobacco Use   Smoking status: Never    Passive exposure: Never   Smokeless tobacco: Never  Vaping Use   Vaping Use: Never used  Substance and Sexual Activity   Alcohol use: Yes    Alcohol/week: 18.0 standard drinks of alcohol    Types: 18 Cans of beer per week    Comment: 03/16/2018 "2-3 beers daily"   Drug use: Not Currently   Sexual activity: Yes  Other Topics Concern   Not on file  Social History Narrative   Not on file   Social Determinants of Health   Financial Resource Strain: Not on file  Food Insecurity: Not on file  Transportation Needs: Not on file  Physical Activity: Not on file  Stress: Not on file  Social Connections: Not on file     Family History: The patient's family history includes Autoimmune disease in his mother; Cancer (age of onset: 65) in his paternal uncle; Colon polyps in his maternal grandfather; Diabetes in his sister; Gout in his paternal  grandfather; Healthy in his mother; Heart attack in his father and paternal grandfather; Mental illness in his maternal grandmother; Stroke in his paternal grandmother. There is no history of Colon cancer, Esophageal cancer, Rectal cancer, or Stomach cancer.  ROS:   Please see the history of present illness.     All other systems reviewed and are negative.  EKGs/Labs/Other Studies Reviewed:    The following studies were reviewed today: Cardiac Studies & Procedures   CARDIAC CATHETERIZATION  CARDIAC CATHETERIZATION 03/16/2018  Narrative  The left ventricular systolic function is normal.  LV end diastolic pressure is normal.  The left ventricular ejection fraction is 55-65% by visual estimate.  Prox RCA lesion is 40% stenosed.  Dist RCA lesion is 65% stenosed.  Post Atrio lesion is 40% stenosed.  Prox Cx lesion is 40% stenosed.  Ost LAD lesion is 90% stenosed.  Prox LAD-1 lesion is 99% stenosed.  Prox LAD-2 lesion is 50% stenosed.  Ost 1st Diag to 1st Diag lesion is 50% stenosed.  1.  Severe two-vessel coronary artery disease involving the ostial LAD as well as proximal LAD at the bifurcation of a large diagonal branch, distal RCA disease as well.  All these lesions were significant by CT FFR.  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 2.  Normal LV systolic function and normal  left ventricular end-diastolic pressure.  Recommendations: Given the involvement of ostial LAD as well as bifurcation LAD stenosis at the origin of very large diagonal branch that has borderline significant proximal disease, I think CABG is a better long-term option. Given severity of disease and symptoms, the patient will be admitted to the hospital and start unfractionated heparin 8 hours after sheath pull. I consulted CVTS for CABG.  Findings Coronary Findings Diagnostic  Dominance: Right  Left Anterior Descending Ost LAD lesion is 90%  stenosed. Prox LAD-1 lesion is 99% stenosed. Prox LAD-2 lesion is 50% stenosed.  First Diagonal Branch Vessel is large in size. Ost 1st Diag to 1st Diag lesion is 50% stenosed.  Second Diagonal Branch Vessel is small  in size.  Left Circumflex Prox Cx lesion is 40% stenosed.  Right Coronary Artery Prox RCA lesion is 40% stenosed. Dist RCA lesion is 65% stenosed.  Right Posterior Atrioventricular Artery Post Atrio lesion is 40% stenosed.  Intervention  No interventions have been documented.   STRESS TESTS  EXERCISE TOLERANCE TEST (ETT) 05/11/2022  Narrative   Normal ETT. Very good exercise capacity   ECHOCARDIOGRAM  ECHOCARDIOGRAM COMPLETE 03/16/2018  Narrative *[place]* *[facility]* [address] [place], [place] [address] [phone]  ------------------------------------------------------------------- Transthoracic Echocardiography  Patient:    Tim Strickland, Tim Strickland MR #:       [id number] Study Date: 03/16/2018 Gender:     M Age:        52 Height:     185.4 cm Weight:     82.6 kg BSA:        2.06 m^2 Pt. Status: Room:       6E04C  ORDERING     Evelene Croon, MD REFERRING    Evelene Croon, MD ADMITTING    Lorine Bears, MD SONOGRAPHER  [facility] ATTENDING    Lance Muss S PERFORMING   [facility], Inpatient  cc:  ------------------------------------------------------------------- LV EF: 50% -   55%  ------------------------------------------------------------------- Indications:      CAD of native vessels 414.01.  ------------------------------------------------------------------- History:   PMH:   Angina pectoris.  Risk factors:  Dyslipidemia.  ------------------------------------------------------------------- Study Conclusions  - Left ventricle: The cavity size was normal. There was mild concentric hypertrophy. Systolic function was at the lower limits of normal. The estimated ejection fraction was in the range  of 50% to 55%. Hypokinesis of the apicallateral and apical myocardium; consistent with ischemia in the distribution of the left anterior descending coronary artery. Left ventricular diastolic function parameters were normal.  ------------------------------------------------------------------- Study data:  No prior study was available for comparison.  Study status:  Routine.  Procedure:  Transthoracic echocardiography. Image quality was adequate.          Transthoracic echocardiography.  M-mode, complete 2D, spectral Doppler, and color Doppler.  Birthdate:  Patient birthdate: 26-Mar-1965.  Age:  Patient is 57 yr old.  Sex:  Gender: male.    BMI: 24 kg/m^2.  Blood pressure:     165/103  Patient status:  Inpatient.  Study date: Study date: 03/16/2018. Study time: 03:26 PM.  Location:  Bedside.  -------------------------------------------------------------------  ------------------------------------------------------------------- Left ventricle:  The cavity size was normal. There was mild concentric hypertrophy. Systolic function was at the lower limits of normal. The estimated ejection fraction was in the range of 50% to 55%.  Regional wall motion abnormalities:   Hypokinesis of the apicallateral and apical myocardium; consistent with ischemia in the distribution of the left anterior descending coronary artery. The transmitral flow pattern was normal. The deceleration time of the early transmitral flow velocity was normal. The pulmonary vein flow pattern was normal. The tissue Doppler parameters were normal. Left ventricular diastolic function parameters were normal.  ------------------------------------------------------------------- Aortic valve:   Trileaflet; normal thickness leaflets. Mobility was not restricted.  Doppler:  Transvalvular velocity was within the normal range. There was no stenosis. There was no  regurgitation.  ------------------------------------------------------------------- Aorta:  Aortic root: The aortic root was normal in size.  ------------------------------------------------------------------- Mitral valve:   Structurally normal valve.   Mobility was not restricted.  Doppler:  Transvalvular velocity was within the normal range. There was no evidence for stenosis. There was no regurgitation.  ------------------------------------------------------------------- Left atrium:  The atrium was normal in size.  -------------------------------------------------------------------  Right ventricle:  The cavity size was normal. Wall thickness was normal. Systolic function was normal.  ------------------------------------------------------------------- Pulmonic valve:    Structurally normal valve.   Cusp separation was normal.  Doppler:  Transvalvular velocity was within the normal range. There was no evidence for stenosis. There was no regurgitation.  ------------------------------------------------------------------- Tricuspid valve:   Structurally normal valve.    Doppler: Transvalvular velocity was within the normal range. There was no regurgitation.  ------------------------------------------------------------------- Pulmonary artery:   The main pulmonary artery was normal-sized. Systolic pressure was within the normal range.  ------------------------------------------------------------------- Right atrium:  The atrium was normal in size.  ------------------------------------------------------------------- Pericardium:  There was no pericardial effusion.  ------------------------------------------------------------------- Systemic veins: Inferior vena cava: The vessel was normal in size.  ------------------------------------------------------------------- Measurements  Left ventricle                         Value        Reference LV ID, ED, PLAX chordal                 48    mm     43 - 52 LV ID, ES, PLAX chordal                34    mm     23 - 38 LV fx shortening, PLAX chordal         29    %      >=29 LV PW thickness, ED                    13    mm     ---------- IVS/LV PW ratio, ED                    0.92         <=1.3 LV e&', lateral                         12    cm/s   ---------- LV E/e&', lateral                       4.18         ---------- LV e&', medial                          9.57  cm/s   ---------- LV E/e&', medial                        5.24         ---------- LV e&', average                         10.79 cm/s   ---------- LV E/e&', average                       4.65         ----------  Ventricular septum                     Value        Reference IVS thickness, ED                      12    mm     ----------  LVOT  Value        Reference LVOT ID, S                             21    mm     ---------- LVOT area                              3.46  cm^2   ----------  Aorta                                  Value        Reference Aortic root ID, ED                     34    mm     ----------  Left atrium                            Value        Reference LA ID, A-P, ES                         36    mm     ---------- LA ID/bsa, A-P                         1.74  cm/m^2 <=2.2 LA volume, S                           34.6  ml     ---------- LA volume/bsa, S                       16.8  ml/m^2 ---------- LA volume, ES, 1-p A4C                 41.6  ml     ---------- LA volume/bsa, ES, 1-p A4C             20.1  ml/m^2 ---------- LA volume, ES, 1-p A2C                 28.8  ml     ---------- LA volume/bsa, ES, 1-p A2C             13.9  ml/m^2 ----------  Mitral valve                           Value        Reference Mitral E-wave peak velocity            50.1  cm/s   ---------- Mitral A-wave peak velocity            41.1  cm/s   ---------- Mitral deceleration time       (H)     299   ms     150 - 230 Mitral E/A  ratio, peak                 1.2          ----------  Right atrium  Value        Reference RA ID, S-I, ES, A4C                    43.8  mm     34 - 49 RA area, ES, A4C                       12.3  cm^2   8.3 - 19.5 RA volume, ES, A/L                     27.4  ml     ---------- RA volume/bsa, ES, A/L                 13.3  ml/m^2 ----------  Systemic veins                         Value        Reference Estimated CVP                          3     mm Hg  ----------  Right ventricle                        Value        Reference TAPSE                                  22.3  mm     ---------- RV s&', lateral, S                      11.5  cm/s   ----------  Legend: (L)  and  (H)  mark values outside specified reference range.  ------------------------------------------------------------------- Prepared and Electronically Authenticated by  Thurmon Fair, MD 2020-01-10T16:43:13   TEE  ECHO INTRAOPERATIVE TEE 03/19/2018  Interpretation Summary  Mitral valve: The mitral leaflets opened normally without restriction. There was normal leaflet thickness. There was trace mitral insufficiency. On the post bypass exam, the mitral valve appeared unchanged from the pre-bypass study. The valve opened normally and there was trace mitral insufficiency.  Right ventricle: The right ventricular cavity was normal in size. There was normal-appearing right ventricular systolic function. The TAPSE was measured at 21.3 mm.  Tricuspid valve: The tricuspid valve appeared structurally normal. There was trace tricuspid insufficiency.  Pulmonic valve: The pulmonic valve leaflets appeared to open normally. There was trace pulmonic insufficiency.    CT SCANS  CT CORONARY MORPH W/CTA COR W/SCORE 03/13/2018  Addendum 03/13/2018  7:19 AM ADDENDUM REPORT: 03/13/2018 07:16  CLINICAL DATA:  Chest pain  EXAM: Cardiac CTA  MEDICATIONS: Sub lingual nitro. 4mg  x 2  TECHNIQUE: The patient was  scanned on a Siemens 192 slice scanner. Gantry rotation speed was 250 msecs. Collimation was 0.6 mm. A 100 kV prospective scan was triggered in the ascending thoracic aorta at 35-75% of the R-R interval. Average HR during the scan was 60 bpm. The 3D data set was interpreted on a dedicated work station using MPR, MIP and VRT modes. A total of 80cc of contrast was used.  FINDINGS: Non-cardiac: See separate report from [facility].  The pulmonary veins drain normally to the left atrium.  Calcium Score: 166 Agatston units.  Coronary Arteries: Right dominant.  LM: No left main given  anomalous LCx.  LAD system: Mixed plaque with two severe serial (70-90%) stenoses in the proximal LAD. A moderate 2nd diagonal appears to be involved ostially by the 2nd LAD stenosis and also appears to have moderate (51-69%) stenosis in the proximal vessel.  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 The vessel is relatively small without apparent significant stenosis.  RCA system: Mixed plaque with mild (<50%) stenosis proximal RCA. Soft plaque with possible severe (70-90%) stenosis distal RCA.  IMPRESSION: 1. Coronary artery calcium score 166 Agatston units. This places the patient in the 90th percentile for age and gender.  2. 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 I do not think that this is a malignant anomaly. The vessel is small without apparent significant disease.  3.  Suspect severe (70-90%) stenosis in the distal RCA.  4. Suspect 2 serial severe (70-90%) stenoses in the proximal LAD. The 2nd diagonal also appears to be significantly disease.  Will send for FFR.  Dalton Mclean   Electronically Signed By: Marca Ancona M.D. On: 03/13/2018 07:16  Narrative EXAM: OVER-READ INTERPRETATION  CT CHEST  The following report  is an over-read performed by radiologist Dr. Charlett Nose of [facility] on 03/12/2018. This over-read does not include interpretation of cardiac or coronary anatomy or pathology. The coronary CTA interpretation by the cardiologist is attached.  COMPARISON:  None.  FINDINGS: Vascular: Heart is normal size.  Visualized aorta is normal caliber.  Mediastinum/Nodes: No adenopathy in the lower mediastinum or hila.  Lungs/Pleura: Visualized lungs clear.  No effusions.  Upper Abdomen: Imaging into the upper abdomen shows no acute findings.  Musculoskeletal: Chest wall soft tissues are unremarkable. No acute bony abnormality.  IMPRESSION: No acute or significant extracardiac abnormality.  Electronically Signed: By: Charlett Nose M.D. On: 03/12/2018 08:58           EKG:  EKG is not ordered today.   Recent Labs: 04/26/2022: Hemoglobin 15.1; Platelets 280 04/28/2022: ALT 48; BUN 15; Creatinine, Ser 0.92; Potassium 4.5; Sodium 138; TSH 2.25  Recent Lipid Panel    Component Value Date/Time   CHOL 131 04/28/2022 1247   CHOL 135 06/01/2020 0907   CHOL 178 01/15/2014 0801   TRIG 225.0 (H) 04/28/2022 1247   TRIG 233 (H) 01/15/2014 0801   HDL 75.00 04/28/2022 1247   HDL 68 06/01/2020 0907   HDL 62 01/15/2014 0801   CHOLHDL 2 04/28/2022 1247   VLDL 45.0 (H) 04/28/2022 1247   LDLCALC 37 01/25/2021 1126   LDLCALC 48 06/01/2020 0907   LDLCALC 69 01/15/2014 0801   LDLDIRECT 35.0 04/28/2022 1247     Risk Assessment/Calculations:      HYPERTENSION CONTROL Vitals:   06/23/22 1558 06/23/22 1605 06/23/22 1606 06/23/22 1607  BP: 128/82 (!) 126/90 (!) 146/101 (!) 138/102    The patient's blood pressure is elevated above target today.  In order to address the patient's elevated BP: A current anti-hypertensive medication was adjusted today.            Physical Exam:    VS:  BP (!) 138/102 (BP Location: Right Arm, Patient Position: Sitting) Comment: Patients home  monitor  Pulse 86   Ht 6' (1.829 m)   Wt 182 lb 12.8 oz (82.9 kg)   SpO2 96%   BMI 24.79 kg/m     Wt Readings from Last 3 Encounters:  06/23/22 182  lb 12.8 oz (82.9 kg)  06/06/22 183 lb (83 kg)  04/29/22 178 lb 3.2 oz (80.8 kg)     GEN: Fit appearing, Well nourished, well developed in no acute distress HEENT: Normal NECK: No JVD; No carotid bruits LYMPHATICS: No lymphadenopathy CARDIAC: RRR, no murmurs, rubs, gallops RESPIRATORY:  Clear to auscultation without rales, wheezing or rhonchi  ABDOMEN: Soft, non-tender, non-distended MUSCULOSKELETAL:  No edema; No deformity  SKIN: Warm and dry NEUROLOGIC:  Alert and oriented x 3 PSYCHIATRIC:  Normal affect   ASSESSMENT:    1. Syncope and collapse   2. Primary hypertension   3. S/P CABG x 5   4. Coronary artery disease involving native coronary artery of native heart without angina pectoris   5. Mixed hyperlipidemia   6. Mild carotid artery disease    PLAN:    In order of problems listed above:  Syncope and collapse-x 2 episodes, is most consistent with dehydration and hypotension surrounding what sounds to be a viral illness.  He has had no recurrence of symptoms since.  He is staying well-hydrated, drinking approximately 64 ounces of water per day.  He wore a life monitor, formal results to follow, and formal review with Dr. Bing Matter reveals no significant pauses, no tachyarrhythmias noted. CAD-s/p CABG x 5 in 2020.  Exercise tolerance test on 05/11/2022 was well-tolerated, without ischemia.  Continue aspirin 81 mg daily, continue Repatha, continue Zetia 10 mg daily, continue Lopressor 25 mg daily, continue nitroglycerin as needed--has not needed. Hyperlipidemia-most recent LDL was well-controlled at 35, continue Zetia 10 mg daily, continue Repatha. Hypertension-blood pressure is elevated in the office today, most recent blood pressure readings at home are elevated as well.  Telmisartan was previously stopped surrounding his  syncopal events.  Will restart telmisartan 10 mg daily for now, creatinine 0.92.  He will keep a blood pressure log and reach out to [place] via MyChart if further titration is needed of his antihypertensive. Mild carotid artery stenosis-minimal plaque noted on imaging leading up to his CABG.  He questions if this could play a role in his syncopal events.  Will repeat carotid ultrasound for completeness.  Disposition-carotid ultrasound, restart telmisartan 10 mg daily, repeat BMET in 2 weeks.          Medication Adjustments/Labs and Tests Ordered: Current medicines are reviewed at length with the patient today.  Concerns regarding medicines are outlined above.  No orders of the defined types were placed in this encounter.  No orders of the defined types were placed in this encounter.   Patient Instructions  Medication Instructions:  Your physician has recommended you make the following change in your medication:   START: Telmisartan 10 mg daily  *If you need a refill on your cardiac medications before your next appointment, please call your pharmacy*   Lab Work: Your physician recommends that you return for lab work in:   Labs in 2 weeks : BMP   If you have labs (blood work) drawn today and your tests are completely normal, you will receive your results only by: MyChart Message (if you have MyChart) OR A paper copy in the mail If you have any lab test that is abnormal or we need to change your treatment, we will call you to review the results.   Testing/Procedures: Your physician has requested that you have a carotid duplex. This test is an ultrasound of the carotid arteries in your neck. It looks at blood flow through these arteries that supply the brain with  blood. Allow one hour for this exam. There are no restrictions or special instructions.    Follow-Up: At [facility], you and your health needs are our priority.  As part of our continuing mission to provide you  with exceptional heart care, we have created designated Provider Care Teams.  These Care Teams include your primary Cardiologist (physician) and Advanced Practice Providers (APPs -  Physician Assistants and Nurse Practitioners) who all work together to provide you with the care you need, when you need it.  We recommend signing up for the patient portal called "MyChart".  Sign up information is provided on this After Visit Summary.  MyChart is used to connect with patients for Virtual Visits (Telemedicine).  Patients are able to view lab/test results, encounter notes, upcoming appointments, etc.  Non-urgent messages can be sent to your provider as well.   To learn more about what you can do with MyChart, go to [email or web address].    Your next appointment:   2 month(s)  Provider:   Wallis Bamberg, NP [facility])    Other Instructions None    Signed, Flossie Dibble, NP  06/23/2022 8:32 PM    [facility]

## 2022-06-23 NOTE — Patient Instructions (Signed)
Medication Instructions:  Your physician has recommended you make the following change in your medication:   START: Telmisartan 10 mg daily  *If you need a refill on your cardiac medications before your next appointment, please call your pharmacy*   Lab Work: Your physician recommends that you return for lab work in:   Labs in 2 weeks : BMP   If you have labs (blood work) drawn today and your tests are completely normal, you will receive your results only by: MyChart Message (if you have MyChart) OR A paper copy in the mail If you have any lab test that is abnormal or we need to change your treatment, we will call you to review the results.   Testing/Procedures: Your physician has requested that you have a carotid duplex. This test is an ultrasound of the carotid arteries in your neck. It looks at blood flow through these arteries that supply the brain with blood. Allow one hour for this exam. There are no restrictions or special instructions.    Follow-Up: At [facility], you and your health needs are our priority.  As part of our continuing mission to provide you with exceptional heart care, we have created designated Provider Care Teams.  These Care Teams include your primary Cardiologist (physician) and Advanced Practice Providers (APPs -  Physician Assistants and Nurse Practitioners) who all work together to provide you with the care you need, when you need it.  We recommend signing up for the patient portal called "MyChart".  Sign up information is provided on this After Visit Summary.  MyChart is used to connect with patients for Virtual Visits (Telemedicine).  Patients are able to view lab/test results, encounter notes, upcoming appointments, etc.  Non-urgent messages can be sent to your provider as well.   To learn more about what you can do with MyChart, go to [email or web address].    Your next appointment:   2 month(s)  Provider:   Wallis Bamberg, NP  [facility])    Other Instructions None

## 2022-06-28 ENCOUNTER — Ambulatory Visit: Payer: Managed Care, Other (non HMO) | Admitting: Cardiology

## 2022-07-11 ENCOUNTER — Ambulatory Visit: Payer: Managed Care, Other (non HMO) | Attending: Cardiology

## 2022-07-11 DIAGNOSIS — I2510 Atherosclerotic heart disease of native coronary artery without angina pectoris: Secondary | ICD-10-CM

## 2022-07-11 DIAGNOSIS — R55 Syncope and collapse: Secondary | ICD-10-CM | POA: Diagnosis not present

## 2022-07-11 LAB — BASIC METABOLIC PANEL WITH GFR
BUN/Creatinine Ratio: 13 (ref 9–20)
BUN: 12 mg/dL (ref 6–24)
CO2: 26 mmol/L (ref 20–29)
Calcium: 9.2 mg/dL (ref 8.7–10.2)
Chloride: 103 mmol/L (ref 96–106)
Creatinine, Ser: 0.93 mg/dL (ref 0.76–1.27)
Glucose: 103 mg/dL — ABNORMAL HIGH (ref 70–99)
Potassium: 5.1 mmol/L (ref 3.5–5.2)
Sodium: 140 mmol/L (ref 134–144)
eGFR: 96 mL/min/1.73

## 2022-07-12 ENCOUNTER — Telehealth: Payer: Self-pay

## 2022-07-12 NOTE — Telephone Encounter (Signed)
-----   Message from Flossie Dibble, NP sent at 07/12/2022 12:53 PM EDT ----- Mr. Millet, The carotid ultrasound revealed no stenosis, which is great news! Keep up the good work, Engineer, structural, [employer]

## 2022-07-12 NOTE — Telephone Encounter (Signed)
Patient notified of results.

## 2022-07-19 ENCOUNTER — Other Ambulatory Visit: Payer: Self-pay | Admitting: Cardiology

## 2022-08-09 ENCOUNTER — Other Ambulatory Visit: Payer: Self-pay | Admitting: Cardiology

## 2022-08-23 ENCOUNTER — Ambulatory Visit: Payer: Managed Care, Other (non HMO) | Admitting: Cardiology

## 2022-09-09 IMAGING — DX DG SCAPULA*L*
2 series · 2 of 2 positions shown · non-contrast
Comparison: None.

CLINICAL DATA: Pain to medial left scapula, no injury

EXAM:
LEFT SCAPULA - 2+ VIEWS

[scapula ap]
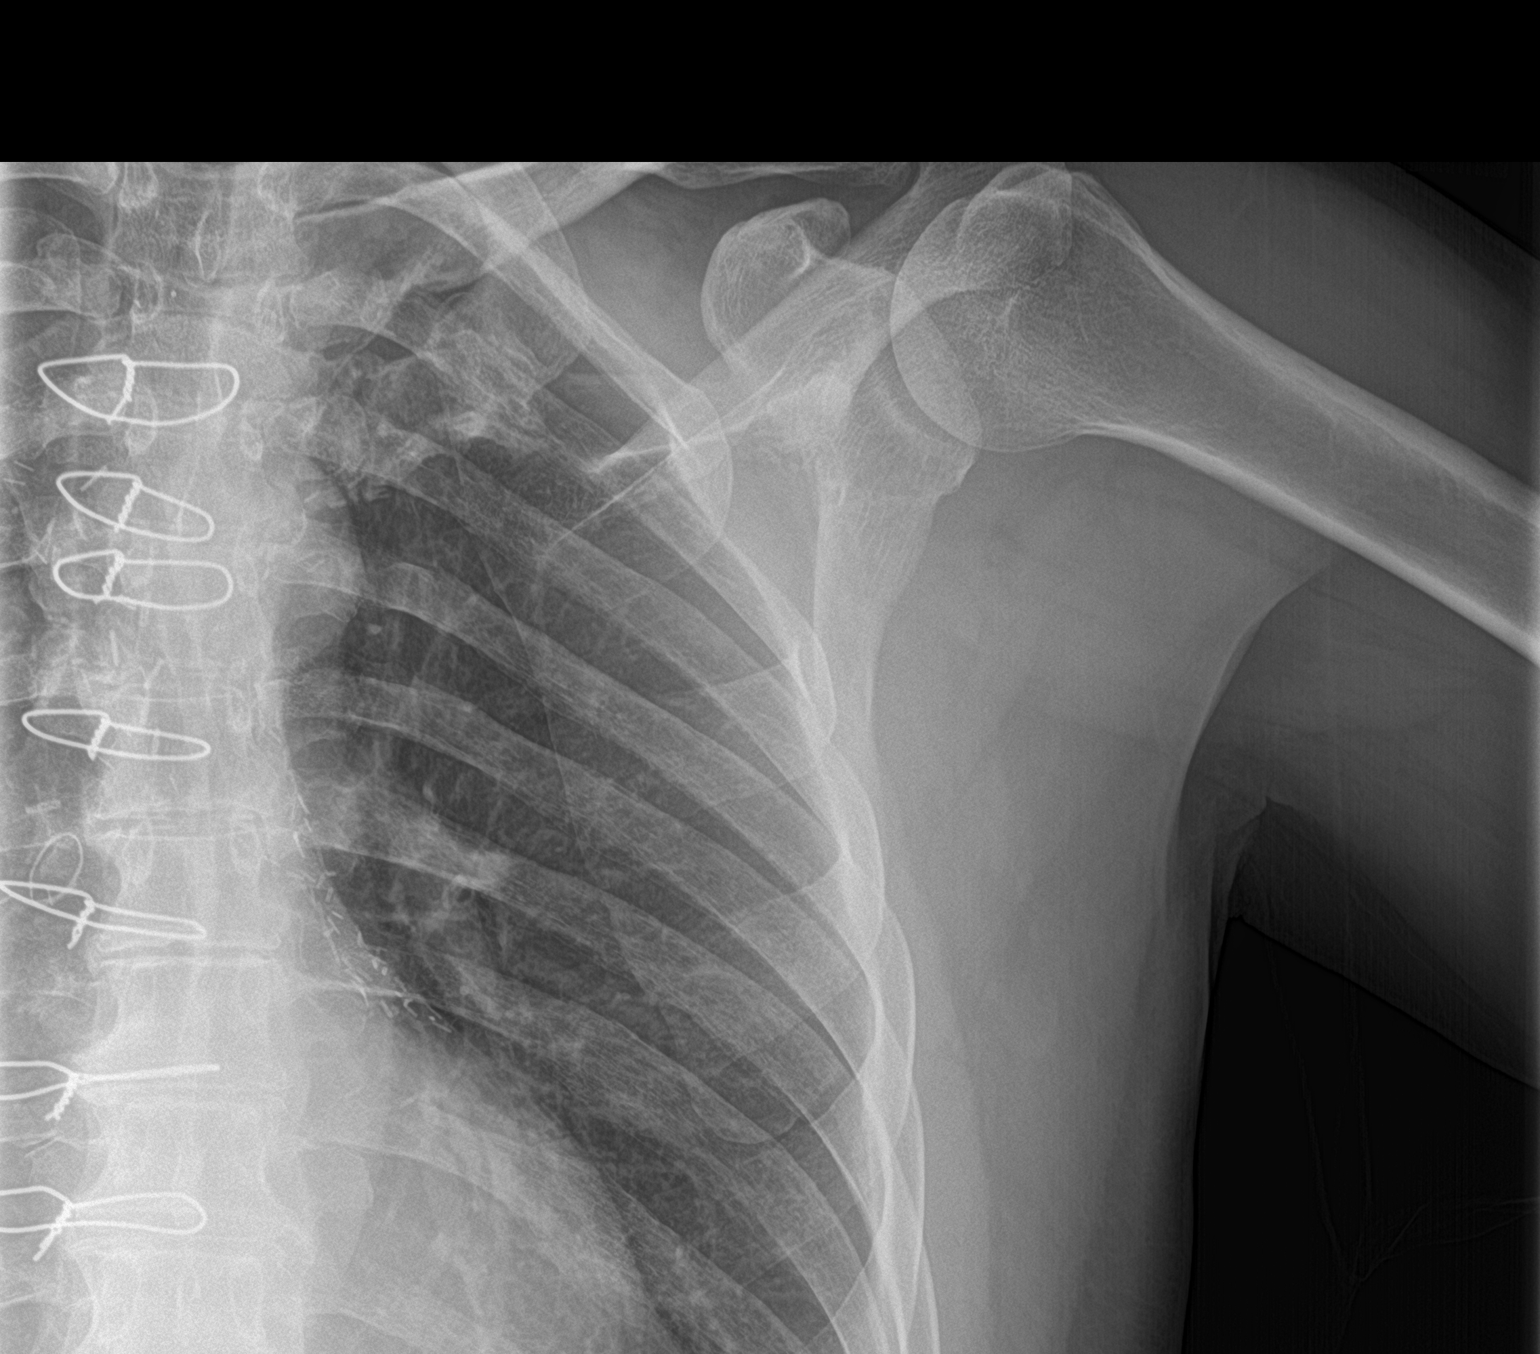

[scapula lat]
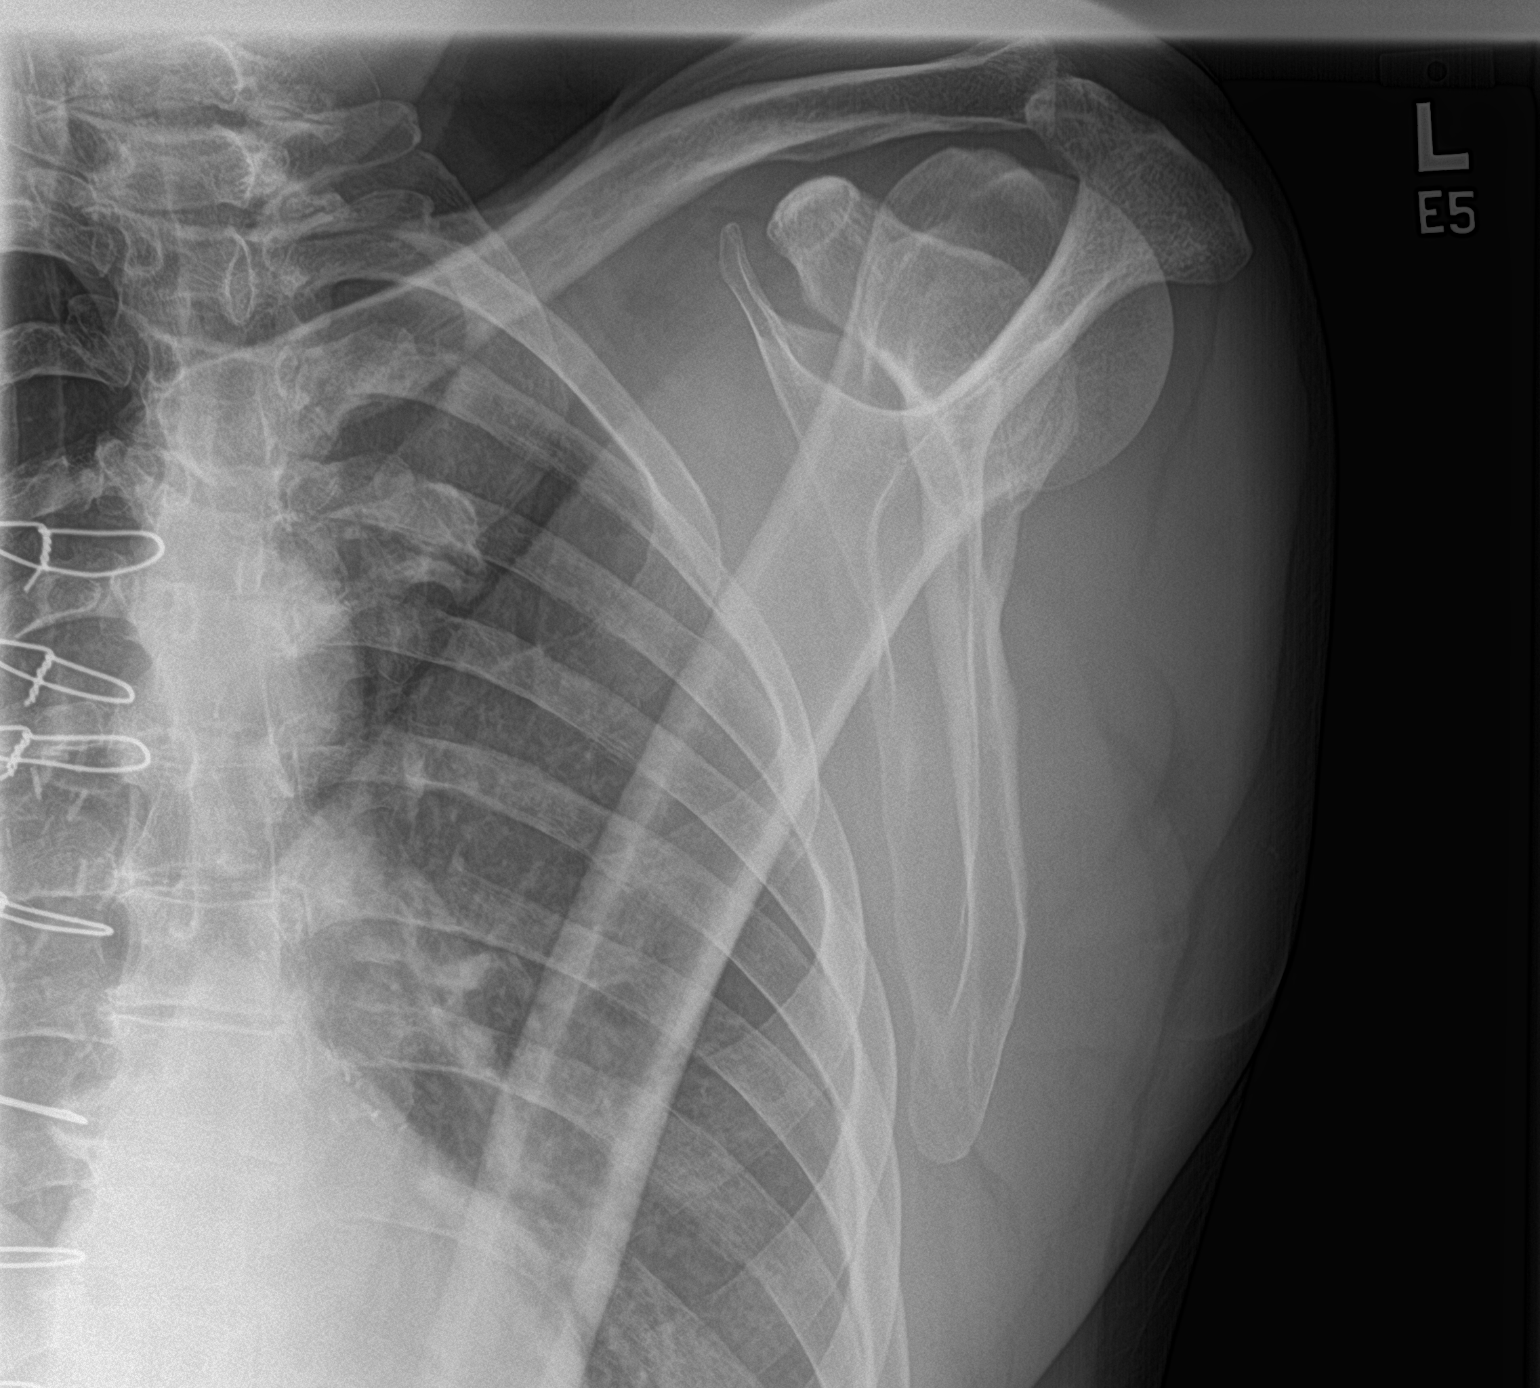

[2 of 2 positions shown; findings below may reference images not displayed]

FINDINGS: There is no evidence of fracture or other focal bone lesions. Soft
tissues are unremarkable.
IMPRESSION: Negative.

## 2023-04-21 ENCOUNTER — Other Ambulatory Visit: Payer: Self-pay | Admitting: Cardiology

## 2023-05-13 ENCOUNTER — Other Ambulatory Visit: Payer: Self-pay | Admitting: Cardiology

## 2023-05-28 ENCOUNTER — Other Ambulatory Visit: Payer: Self-pay | Admitting: Cardiology

## 2023-06-07 ENCOUNTER — Other Ambulatory Visit: Payer: Self-pay

## 2023-06-08 NOTE — Progress Notes (Unsigned)
 Cardiology Office Note:    Date:  06/09/2023   ID:  Tim Strickland, DOB [birth year], MRN [id number]  PCP:  Tim Strickland   [facility] Providers Cardiologist:  Norman Herrlich, MD     Referring MD: Esperanza Richters, PA-C   History of Present Illness:    Tim Strickland is a 58 y.o. male with a hx of CAD s/p CABG x 5, hypertension, asthma, syncope and collapse, hyperlipidemia, LAFB.  07/12/2022 carotid duplex negative for stenosis 06/06/2022 monitor average heart rate 81 bpm, predominant rhythm was sinus, no pauses, triggered events were associated with sinus tachycardia 05/10/2022 exercise tolerance test normal, good exercise capacity 03/19/2018 CABG x 5 03/16/2018 cardiac cath severe two-vessel CAD not amenable for PCI  [facility] with [facility] in 2019 for exertional angina.  He had a coronary CTA which revealed a calcium score of 166 placing him in the 90th percentile for age and gender.  FFR suggested hemodynamically significant distal RCA stenosis, severe proximal LAD stenosis, and severe proximal diagonal stenosis.  He underwent LHC on 03/16/2018 showing severe two-vessel CAD involving the ostial LAD as well as proximal LAD that was not amenable to PCI.  Echo at this time revealed an EF of 50 to 55%, hypokinesis of the apical apical lateral myocardium consistent with ischemia in the distribution of the LAD.  Carotid ultrasound at this time showed minimal wall thickening bilaterally.  He underwent CABG x 5, LIMA to diagonal 2, RIMA to LAD, SVG to diagonal 1, sequential SVG to PL and PDA with endoscopic harvest of greater saphenous vein from the right leg.  Most recently he was evaluated by myself in April 2024, stable from a cardiac perspective, continue to be both physically active at work and in his personal life, checking his blood pressure at home and it was well-controlled.  He had had recent syncopal event when he was hiking, we arranged for carotid duplex for completeness  and it revealed no carotid artery stenosis.  He presents today for follow-up of his CAD.  He has been doing well since last evaluated in our office, no complaints from a cardiac perspective.  He would like to establish care with a cardiologist in [place] residence where he lives.  He stays very physically active at work, works in [employer].  He also hikes and [place] on occasion. He denies chest pain, palpitations, dyspnea, pnd, orthopnea, n, v, dizziness, syncope, edema, weight gain, or early satiety.   EKG Interpretation Date/Time:  Friday June 09 2023 08:21:38 EDT Ventricular Rate:  70 PR Interval:  126 QRS Duration:  100 QT Interval:  390 QTC Calculation: 421 R Axis:   114  Text Interpretation: Normal sinus rhythm Left posterior fascicular block Cannot rule out Anterior infarct , age undetermined Abnormal ECG When compared with ECG of 10-May-2022 08:58, Vent. rate has decreased BY  39 BPM T wave amplitude has decreased in Anterior leads Confirmed by Wallis Bamberg [phone]) on 06/09/2023 8:32:36 AM   Past Medical History:  Diagnosis Date   Asthma 12/05/2008   Qualifier: Diagnosis of  By: Alwyn Ren MD, William   Onset @ age 24 after environmental exposures (home built in 60s). No  maintenance agents; Rx: avoidance of triggers (paint fumes, dust) Singulair & albuterol prn     Bursitis of right shoulder 01/15/2014   Ultrasound guided injection 01/15/2014    CAD (coronary artery disease), native coronary artery 01/25/2018   Coronary artery disease    Elevated liver enzymes    PMH  of  on Pravachol   Encounter for general adult medical examination with abnormal findings 11/04/2016   Environmental asthma    Familial hyperlipidemia 12/05/2008   Qualifier: Diagnosis of  By: Alwyn Ren MD, Lynder Parents Lipoprofile 2006: LDL 162 ( 1985/ 1351), HDL 51, TG 108.  NMR Lipoprofile 01/2014 : LDL 69 ( 885 / 601  ),  HDL 62 , Triglycerides 233 . LP-IR 62. LDL goal= < 110, ideally < 80. Father MI @ before 23;  PGF MI in 61s. PGM CVA in 60s Note: previously on Pravastatin 20 mg ; stopped because  mild hepatic enzyme rise. [facility] Panel 01/2011: NOT h   HTN (hypertension) 03/18/2018   Hyperglycemia    A1c 5.2% in 2010   Hyperlipidemia    Hypertension    Keloid of skin 07/24/2018   Left wrist tendinitis 02/23/2016   Meralgia paraesthetica, left 04/19/2018   Paresthesia 07/24/2018   Right rotator cuff tear 04/26/2016   Injected A 17 2018.   S/P CABG x 5    RIMA to LAD LIMA to Diagonal 2 SVG to Diagonal 1 Sequential SVG to PD and PLB    Shoulder impingement syndrome, right 02/23/2016   Synovitis of toe 02/23/2016    Past Surgical History:  Procedure Laterality Date   APPENDECTOMY     CORONARY ARTERY BYPASS GRAFT N/A 03/19/2018   Procedure: CORONARY ARTERY BYPASS GRAFTING (CABG), ON PUMP, TIMES FIVE, USING BILATERAL INTERNAL MAMMARY ARTERIES AND ENDOSCOPICALLY HARVESTED RIGHT GREATER SAPHENOUS VEIN;  Surgeon: Alleen Borne, MD;  Location: [facility] OR;  Service: Open Heart Surgery;  Laterality: N/A;   LEFT HEART CATH AND CORONARY ANGIOGRAPHY N/A 03/16/2018   Procedure: LEFT HEART CATH AND CORONARY ANGIOGRAPHY;  Surgeon: Iran Ouch, MD;  Location: [facility] INVASIVE CV LAB;  Service: Cardiovascular;  Laterality: N/A;   TEE WITHOUT CARDIOVERSION N/A 03/19/2018   Procedure: TRANSESOPHAGEAL ECHOCARDIOGRAM (TEE);  Surgeon: Alleen Borne, MD;  Location: [facility] OR;  Service: Open Heart Surgery;  Laterality: N/A;   TONSILLECTOMY     VASECTOMY     WISDOM TOOTH EXTRACTION      Current Medications: Current Meds  Medication Sig   albuterol (PROVENTIL HFA;VENTOLIN HFA) 108 (90 Base) MCG/ACT inhaler Inhale 2 puffs into the lungs every 6 (six) hours as needed for wheezing or shortness of breath.   aspirin EC 81 MG tablet Take 1 tablet (81 mg total) by mouth daily.   cyclobenzaprine (FLEXERIL) 10 MG tablet 1 tab po q hs prn muscle spams/scapula pain   Evolocumab (REPATHA SURECLICK) 140 MG/ML SOAJ Inject 140 mg into  the skin every 14 (fourteen) days. INJECT 140 MG INTO THE SKIN EVERY 14 DAYS.   methylPREDNISolone (MEDROL) 4 MG tablet Standard 6 day taper dose   metoprolol tartrate (LOPRESSOR) 25 MG tablet Take 1 tablet (25 mg total) by mouth daily.   nitroGLYCERIN (NITROSTAT) 0.4 MG SL tablet Place 0.4 mg under the tongue every 5 (five) minutes as needed for chest pain.   telmisartan (MICARDIS) 20 MG tablet Take 0.5 tablets (10 mg total) by mouth daily. (Patient taking differently: Take 20 mg by mouth daily.)     Allergies:   Pravastatin   Social History   Socioeconomic History   Marital status: Married    Spouse name: Not on file   Number of children: 2   Years of education: 16   Highest education level: Not on file  Occupational History   Occupation: Remodler  Tobacco Use   Smoking status: Never  Passive exposure: Never   Smokeless tobacco: Never  Vaping Use   Vaping status: Never Used  Substance and Sexual Activity   Alcohol use: Yes    Alcohol/week: 18.0 standard drinks of alcohol    Types: 18 Cans of beer per week    Comment: 03/16/2018 "2-3 beers daily"   Drug use: Not Currently   Sexual activity: Yes  Other Topics Concern   Not on file  Social History Narrative   Not on file   Social Drivers of Health   Financial Resource Strain: Not on file  Food Insecurity: Not on file  Transportation Needs: Not on file  Physical Activity: Not on file  Stress: Not on file  Social Connections: Not on file     Family History: The patient's family history includes Autoimmune disease in his mother; Cancer (age of onset: 19) in his paternal uncle; Colon polyps in his maternal grandfather; Diabetes in his sister; Gout in his paternal grandfather; Healthy in his mother; Heart attack in his father and paternal grandfather; Mental illness in his maternal grandmother; Stroke in his paternal grandmother. There is no history of Colon cancer, Esophageal cancer, Rectal cancer, or Stomach  cancer.  ROS:   Please see the history of present illness.     All other systems reviewed and are negative.  EKGs/Labs/Other Studies Reviewed:    The following studies were reviewed today: Cardiac Studies & Procedures   ______________________________________________________________________________________________ CARDIAC CATHETERIZATION  CARDIAC CATHETERIZATION 03/16/2018  Narrative  The left ventricular systolic function is normal.  LV end diastolic pressure is normal.  The left ventricular ejection fraction is 55-65% by visual estimate.  Prox RCA lesion is 40% stenosed.  Dist RCA lesion is 65% stenosed.  Post Atrio lesion is 40% stenosed.  Prox Cx lesion is 40% stenosed.  Ost LAD lesion is 90% stenosed.  Prox LAD-1 lesion is 99% stenosed.  Prox LAD-2 lesion is 50% stenosed.  Ost 1st Diag to 1st Diag lesion is 50% stenosed.  1.  Severe two-vessel coronary artery disease involving the ostial LAD as well as proximal LAD at the bifurcation of a large diagonal branch, distal RCA disease as well.  All these lesions were significant by CT FFR.  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 2.  Normal LV systolic function and normal  left ventricular end-diastolic pressure.  Recommendations: Given the involvement of ostial LAD as well as bifurcation LAD stenosis at the origin of very large diagonal branch that has borderline significant proximal disease, I think CABG is a better long-term option. Given severity of disease and symptoms, the patient will be admitted to the hospital and start unfractionated heparin 8 hours after sheath pull. I consulted CVTS for CABG.  Findings Coronary Findings Diagnostic  Dominance: Right  Left Anterior Descending Ost LAD lesion is 90% stenosed. Prox LAD-1 lesion is 99% stenosed. Prox LAD-2 lesion is 50% stenosed.  First Diagonal Branch Vessel is large in size. Ost 1st Diag to 1st  Diag lesion is 50% stenosed.  Second Diagonal Branch Vessel is small in size.  Left Circumflex Prox Cx lesion is 40% stenosed.  Right Coronary Artery Prox RCA lesion is 40% stenosed. Dist RCA lesion is 65% stenosed.  Right Posterior Atrioventricular Artery Post Atrio lesion is 40% stenosed.  Intervention  No interventions have been documented.   STRESS TESTS  EXERCISE TOLERANCE TEST (ETT) 05/10/2022  Narrative   Normal ETT. Very good exercise capacity   ECHOCARDIOGRAM  ECHOCARDIOGRAM  COMPLETE 03/16/2018  Narrative *[place]* *[facility]* [address] [place], [place] [address] [phone]  ------------------------------------------------------------------- Transthoracic Echocardiography  Patient:    Nasire, Reali MR #:       [id number] Study Date: 03/16/2018 Gender:     M Age:        52 Height:     185.4 cm Weight:     82.6 kg BSA:        2.06 m^2 Pt. Status: Room:       6E04C  ORDERING     Evelene Croon, MD REFERRING    Evelene Croon, MD ADMITTING    Lorine Bears, MD SONOGRAPHER  [facility] ATTENDING    Lance Muss S PERFORMING   [facility], Inpatient  cc:  ------------------------------------------------------------------- LV EF: 50% -   55%  ------------------------------------------------------------------- Indications:      CAD of native vessels 414.01.  ------------------------------------------------------------------- History:   PMH:   Angina pectoris.  Risk factors:  Dyslipidemia.  ------------------------------------------------------------------- Study Conclusions  - Left ventricle: The cavity size was normal. There was mild concentric hypertrophy. Systolic function was at the lower limits of normal. The estimated ejection fraction was in the range of 50% to 55%. Hypokinesis of the apicallateral and apical myocardium; consistent with ischemia in the distribution of the left anterior descending coronary  artery. Left ventricular diastolic function parameters were normal.  ------------------------------------------------------------------- Study data:  No prior study was available for comparison.  Study status:  Routine.  Procedure:  Transthoracic echocardiography. Image quality was adequate.          Transthoracic echocardiography.  M-mode, complete 2D, spectral Doppler, and color Doppler.  Birthdate:  Patient birthdate: June 18, 1965.  Age:  Patient is 58 yr old.  Sex:  Gender: male.    BMI: 24 kg/m^2.  Blood pressure:     165/103  Patient status:  Inpatient.  Study date: Study date: 03/16/2018. Study time: 03:26 PM.  Location:  Bedside.  -------------------------------------------------------------------  ------------------------------------------------------------------- Left ventricle:  The cavity size was normal. There was mild concentric hypertrophy. Systolic function was at the lower limits of normal. The estimated ejection fraction was in the range of 50% to 55%.  Regional wall motion abnormalities:   Hypokinesis of the apicallateral and apical myocardium; consistent with ischemia in the distribution of the left anterior descending coronary artery. The transmitral flow pattern was normal. The deceleration time of the early transmitral flow velocity was normal. The pulmonary vein flow pattern was normal. The tissue Doppler parameters were normal. Left ventricular diastolic function parameters were normal.  ------------------------------------------------------------------- Aortic valve:   Trileaflet; normal thickness leaflets. Mobility was not restricted.  Doppler:  Transvalvular velocity was within the normal range. There was no stenosis. There was no regurgitation.  ------------------------------------------------------------------- Aorta:  Aortic root: The aortic root was normal in size.  ------------------------------------------------------------------- Mitral valve:    Structurally normal valve.   Mobility was not restricted.  Doppler:  Transvalvular velocity was within the normal range. There was no evidence for stenosis. There was no regurgitation.  ------------------------------------------------------------------- Left atrium:  The atrium was normal in size.  ------------------------------------------------------------------- Right ventricle:  The cavity size was normal. Wall thickness was normal. Systolic function was normal.  ------------------------------------------------------------------- Pulmonic valve:    Structurally normal valve.   Cusp separation was normal.  Doppler:  Transvalvular velocity was within the normal range. There was no evidence for stenosis. There was no regurgitation.  ------------------------------------------------------------------- Tricuspid valve:   Structurally normal valve.    Doppler: Transvalvular velocity was within the  normal range. There was no regurgitation.  ------------------------------------------------------------------- Pulmonary artery:   The main pulmonary artery was normal-sized. Systolic pressure was within the normal range.  ------------------------------------------------------------------- Right atrium:  The atrium was normal in size.  ------------------------------------------------------------------- Pericardium:  There was no pericardial effusion.  ------------------------------------------------------------------- Systemic veins: Inferior vena cava: The vessel was normal in size.  ------------------------------------------------------------------- Measurements  Left ventricle                         Value        Reference LV ID, ED, PLAX chordal                48    mm     43 - 52 LV ID, ES, PLAX chordal                34    mm     23 - 38 LV fx shortening, PLAX chordal         29    %      >=29 LV PW thickness, ED                    13    mm     ---------- IVS/LV PW ratio, ED                     0.92         <=1.3 LV e&', lateral                         12    cm/s   ---------- LV E/e&', lateral                       4.18         ---------- LV e&', medial                          9.57  cm/s   ---------- LV E/e&', medial                        5.24         ---------- LV e&', average                         10.79 cm/s   ---------- LV E/e&', average                       4.65         ----------  Ventricular septum                     Value        Reference IVS thickness, ED                      12    mm     ----------  LVOT                                   Value        Reference LVOT ID, S  21    mm     ---------- LVOT area                              3.46  cm^2   ----------  Aorta                                  Value        Reference Aortic root ID, ED                     34    mm     ----------  Left atrium                            Value        Reference LA ID, A-P, ES                         36    mm     ---------- LA ID/bsa, A-P                         1.74  cm/m^2 <=2.2 LA volume, S                           34.6  ml     ---------- LA volume/bsa, S                       16.8  ml/m^2 ---------- LA volume, ES, 1-p A4C                 41.6  ml     ---------- LA volume/bsa, ES, 1-p A4C             20.1  ml/m^2 ---------- LA volume, ES, 1-p A2C                 28.8  ml     ---------- LA volume/bsa, ES, 1-p A2C             13.9  ml/m^2 ----------  Mitral valve                           Value        Reference Mitral E-wave peak velocity            50.1  cm/s   ---------- Mitral A-wave peak velocity            41.1  cm/s   ---------- Mitral deceleration time       (H)     299   ms     150 - 230 Mitral E/A ratio, peak                 1.2          ----------  Right atrium                           Value        Reference RA ID, S-I, ES, A4C                    43.8  mm  34 - 49 RA area, ES, A4C                       12.3  cm^2    8.3 - 19.5 RA volume, ES, A/L                     27.4  ml     ---------- RA volume/bsa, ES, A/L                 13.3  ml/m^2 ----------  Systemic veins                         Value        Reference Estimated CVP                          3     mm Hg  ----------  Right ventricle                        Value        Reference TAPSE                                  22.3  mm     ---------- RV s&', lateral, S                      11.5  cm/s   ----------  Legend: (L)  and  (H)  mark values outside specified reference range.  ------------------------------------------------------------------- Prepared and Electronically Authenticated by  Thurmon Fair, MD 2020-01-10T16:43:13   TEE  ECHO INTRAOPERATIVE TEE 03/19/2018  Interpretation Summary  Mitral valve: The mitral leaflets opened normally without restriction. There was normal leaflet thickness. There was trace mitral insufficiency. On the post bypass exam, the mitral valve appeared unchanged from the pre-bypass study. The valve opened normally and there was trace mitral insufficiency.  Right ventricle: The right ventricular cavity was normal in size. There was normal-appearing right ventricular systolic function. The TAPSE was measured at 21.3 mm.  Tricuspid valve: The tricuspid valve appeared structurally normal. There was trace tricuspid insufficiency.  Pulmonic valve: The pulmonic valve leaflets appeared to open normally. There was trace pulmonic insufficiency.  MONITORS  LONG TERM MONITOR-LIVE TELEMETRY (3-14 DAYS) 06/24/2022  Narrative Patch Wear Time:  8 days and 17 hours (2024-04-01T15:36:16-0400 to 2024-04-10T08:48:50-0400)  Patient had a min HR of 54 bpm, max HR of 129 bpm, and avg HR of 81 bpm. Predominant underlying rhythm was Sinus Rhythm.  There were no pauses of 3 seconds or greater and no episodes of second or third-degree AV nodal block.  There were 1 triggered and 4 diary events all sinus rhythm and sinus  tachycardia with rate 73 to 108 bpm  Isolated SVEs were rare (<1.0%), SVE Couplets were rare (<1.0%), and no SVE Triplets were present.  There were no episodes of atrial fibrillation or flutter.  Isolated VEs were rare (<1.0%), VE Couplets were rare (<1.0%), and no VE Triplets were present. Ventricular Trigeminy was present.   CT SCANS  CT CORONARY FRACTIONAL FLOW RESERVE DATA PREP 03/13/2018  Narrative CLINICAL DATA:  Chest pain  EXAM: CT FFR  MEDICATIONS: No additional medications.  TECHNIQUE: The coronary CTA was sent for FFR.  FINDINGS: FFR 0.76 distal RCA.  FFR <0.5 diagonal  FFR <  0.5 mid LAD.  IMPRESSION: CT FFR suggests hemodynamically significant distal RCA stenosis, severe proximal LAD stenosis, and severe proximal diagonal stenosis.  Dalton Mclean   Electronically Signed By: Marca Ancona M.D. On: 03/14/2018 18:30   CT CORONARY MORPH W/CTA COR W/SCORE 03/12/2018  Addendum 03/13/2018  7:19 AM ADDENDUM REPORT: 03/13/2018 07:16  CLINICAL DATA:  Chest pain  EXAM: Cardiac CTA  MEDICATIONS: Sub lingual nitro. 4mg  x 2  TECHNIQUE: The patient was scanned on a Siemens 192 slice scanner. Gantry rotation speed was 250 msecs. Collimation was 0.6 mm. A 100 kV prospective scan was triggered in the ascending thoracic aorta at 35-75% of the R-R interval. Average HR during the scan was 60 bpm. The 3D data set was interpreted on a dedicated work station using MPR, MIP and VRT modes. A total of 80cc of contrast was used.  FINDINGS: Non-cardiac: See separate report from [facility].  The pulmonary veins drain normally to the left atrium.  Calcium Score: 166 Agatston units.  Coronary Arteries: Right dominant.  LM: No left main given anomalous LCx.  LAD system: Mixed plaque with two severe serial (70-90%) stenoses in the proximal LAD. A moderate 2nd diagonal appears to be involved ostially by the 2nd LAD stenosis and also appears to have  moderate (51-69%) stenosis in the proximal vessel.  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 The vessel is relatively small without apparent significant stenosis.  RCA system: Mixed plaque with mild (<50%) stenosis proximal RCA. Soft plaque with possible severe (70-90%) stenosis distal RCA.  IMPRESSION: 1. Coronary artery calcium score 166 Agatston units. This places the patient in the 90th percentile for age and gender.  2. 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 I do not think that this is a malignant anomaly. The vessel is small without apparent significant disease.  3.  Suspect severe (70-90%) stenosis in the distal RCA.  4. Suspect 2 serial severe (70-90%) stenoses in the proximal LAD. The 2nd diagonal also appears to be significantly disease.  Will send for FFR.  Dalton Mclean   Electronically Signed By: Marca Ancona M.D. On: 03/13/2018 07:16  Narrative EXAM: OVER-READ INTERPRETATION  CT CHEST  The following report is an over-read performed by radiologist Dr. Charlett Nose of [facility] on 03/12/2018. This over-read does not include interpretation of cardiac or coronary anatomy or pathology. The coronary CTA interpretation by the cardiologist is attached.  COMPARISON:  None.  FINDINGS: Vascular: Heart is normal size.  Visualized aorta is normal caliber.  Mediastinum/Nodes: No adenopathy in the lower mediastinum or hila.  Lungs/Pleura: Visualized lungs clear.  No effusions.  Upper Abdomen: Imaging into the upper abdomen shows no acute findings.  Musculoskeletal: Chest wall soft tissues are unremarkable. No acute bony abnormality.  IMPRESSION: No acute or significant extracardiac abnormality.  Electronically Signed: By: Charlett Nose M.D. On: 03/12/2018 08:58      ______________________________________________________________________________________________       EKG:  EKG is not ordered today.   Recent Labs: 07/11/2022: BUN 12; Creatinine, Ser 0.93; Potassium 5.1; Sodium 140  Recent Lipid Panel    Component Value Date/Time   CHOL 131 04/28/2022 1247   CHOL 135 06/01/2020 0907   CHOL 178 01/15/2014 0801   TRIG 225.0 (H) 04/28/2022 1247   TRIG 233 (H) 01/15/2014 0801   HDL 75.00 04/28/2022 1247   HDL 68 06/01/2020 0907  HDL 62 01/15/2014 0801   CHOLHDL 2 04/28/2022 1247   VLDL 45.0 (H) 04/28/2022 1247   LDLCALC 37 01/25/2021 1126   LDLCALC 48 06/01/2020 0907   LDLCALC 69 01/15/2014 0801   LDLDIRECT 35.0 04/28/2022 1247     Risk Assessment/Calculations:                Physical Exam:    VS:  BP 136/76   Pulse 70   Ht 6\' 1"  (1.854 m)   Wt 175 lb 9.6 oz (79.7 kg)   SpO2 97%   BMI 23.17 kg/m     Wt Readings from Last 3 Encounters:  06/09/23 175 lb 9.6 oz (79.7 kg)  06/23/22 182 lb 12.8 oz (82.9 kg)  06/06/22 183 lb (83 kg)     GEN: Fit appearing, Well nourished, well developed in no acute distress HEENT: Normal NECK: No JVD; No carotid bruits LYMPHATICS: No lymphadenopathy CARDIAC: RRR, no murmurs, rubs, gallops RESPIRATORY:  Clear to auscultation without rales, wheezing or rhonchi  ABDOMEN: Soft, non-tender, non-distended MUSCULOSKELETAL:  No edema; No deformity  SKIN: Warm and dry NEUROLOGIC:  Alert and oriented x 3 PSYCHIATRIC:  Normal affect   ASSESSMENT:    1. Coronary artery disease involving native coronary artery of native heart without angina pectoris   2. S/P CABG x 5   3. Syncope and collapse   4. Primary hypertension   5. Mixed hyperlipidemia   6. Medication management     PLAN:    In order of problems listed above:  Syncope and collapse-  No further episodes, they sounded to be most consistent with dehydration and hypotension.  He wore a monitor which revealed no contributory causes.  We  also arrange a carotid duplex was normal.  CAD-s/p CABG x 5 in 2020.  Exercise tolerance test on 05/11/2022 was well-tolerated, without ischemia.  Continue aspirin 81 mg daily, continue Repatha, continue Lopressor 25 mg daily, continue nitroglycerin as needed--has not needed.  He is very physically active.  Hyperlipidemia-most recent LDL was well-controlled at 35, continue Repatha.  LP(a) 9.0.  Hypertension-blood pressure today is controlled at 136/76, he has not taken his telmisartan this morning yet.  Continue telmisartan currently taking 20 mg daily, continue Lopressor 25 mg daily.  Mild carotid artery stenosis-minimal plaque noted on imaging leading up to his CABG. we repeated a duplex in May 2024 to see if this could be a contributing factor to his syncopal events and it revealed no evidence of stenosis.  Disposition-CMET, CBC, direct LDL today, follow-up in 1 year and he would like to establish in [place] as that is where he lives.  Follow-up in 1 year with Dr. Lynnette Caffey.           Medication Adjustments/Labs and Tests Ordered: Current medicines are reviewed at length with the patient today.  Concerns regarding medicines are outlined above.  Orders Placed This Encounter  Procedures   Comprehensive metabolic panel with GFR   CBC with Differential/Platelet   LDL cholesterol, direct   EKG 12-Lead   No orders of the defined types were placed in this encounter.   Patient Instructions  Medication Instructions:  Your physician recommends that you continue on your current medications as directed. Please refer to the Current Medication list given to you today.  *If you need a refill on your cardiac medications before your next appointment, please call your pharmacy*  Lab Work: Your physician recommends that you return for lab work in: Today for CMP, CBC and Direct  LDL   If you have labs (blood work) drawn today and your tests are completely normal, you will receive your results  only by: MyChart Message (if you have MyChart) OR A paper copy in the mail If you have any lab test that is abnormal or we need to change your treatment, we will call you to review the results.  Testing/Procedures: NONE  Follow-Up: At [facility], you and your health needs are our priority.  As part of our continuing mission to provide you with exceptional heart care, our providers are all part of one team.  This team includes your primary Cardiologist (physician) and Advanced Practice Providers or APPs (Physician Assistants and Nurse Practitioners) who all work together to provide you with the care you need, when you need it.  Your next appointment:   1 year(s)  Provider:   Lupita Raider, Phd, MD    We recommend signing up for the patient portal called "MyChart".  Sign up information is provided on this After Visit Summary.  MyChart is used to connect with patients for Virtual Visits (Telemedicine).  Patients are able to view lab/test results, encounter notes, upcoming appointments, etc.  Non-urgent messages can be sent to your provider as well.   To learn more about what you can do with MyChart, go to [email or web address].   Other Instructions          Signed, Flossie Dibble, NP  06/09/2023 9:02 AM    [facility]

## 2023-06-09 ENCOUNTER — Encounter: Payer: Self-pay | Admitting: Cardiology

## 2023-06-09 ENCOUNTER — Other Ambulatory Visit: Payer: Self-pay | Admitting: Cardiology

## 2023-06-09 ENCOUNTER — Ambulatory Visit: Attending: Cardiology | Admitting: Cardiology

## 2023-06-09 VITALS — BP 136/76 | HR 70 | Ht 73.0 in | Wt 175.6 lb

## 2023-06-09 DIAGNOSIS — Z79899 Other long term (current) drug therapy: Secondary | ICD-10-CM

## 2023-06-09 DIAGNOSIS — I2510 Atherosclerotic heart disease of native coronary artery without angina pectoris: Secondary | ICD-10-CM

## 2023-06-09 DIAGNOSIS — R55 Syncope and collapse: Secondary | ICD-10-CM | POA: Diagnosis not present

## 2023-06-09 DIAGNOSIS — I10 Essential (primary) hypertension: Secondary | ICD-10-CM

## 2023-06-09 DIAGNOSIS — Z951 Presence of aortocoronary bypass graft: Secondary | ICD-10-CM | POA: Diagnosis not present

## 2023-06-09 DIAGNOSIS — E782 Mixed hyperlipidemia: Secondary | ICD-10-CM

## 2023-06-09 NOTE — Patient Instructions (Signed)
 Medication Instructions:  Your physician recommends that you continue on your current medications as directed. Please refer to the Current Medication list given to you today.  *If you need a refill on your cardiac medications before your next appointment, please call your pharmacy*  Lab Work: Your physician recommends that you return for lab work in: Today for CMP, CBC and Direct LDL   If you have labs (blood work) drawn today and your tests are completely normal, you will receive your results only by: MyChart Message (if you have MyChart) OR A paper copy in the mail If you have any lab test that is abnormal or we need to change your treatment, we will call you to review the results.  Testing/Procedures: NONE  Follow-Up: At [facility], you and your health needs are our priority.  As part of our continuing mission to provide you with exceptional heart care, our providers are all part of one team.  This team includes your primary Cardiologist (physician) and Advanced Practice Providers or APPs (Physician Assistants and Nurse Practitioners) who all work together to provide you with the care you need, when you need it.  Your next appointment:   1 year(s)  Provider:   Lupita Raider, Phd, MD    We recommend signing up for the patient portal called "MyChart".  Sign up information is provided on this After Visit Summary.  MyChart is used to connect with patients for Virtual Visits (Telemedicine).  Patients are able to view lab/test results, encounter notes, upcoming appointments, etc.  Non-urgent messages can be sent to your provider as well.   To learn more about what you can do with MyChart, go to [email or web address].   Other Instructions

## 2023-06-10 LAB — CBC WITH DIFFERENTIAL/PLATELET
Basophils Absolute: 0.1 x10E3/uL (ref 0.0–0.2)
Basos: 2 %
EOS (ABSOLUTE): 0.2 x10E3/uL (ref 0.0–0.4)
Eos: 3 %
Hematocrit: 46.2 % (ref 37.5–51.0)
Hemoglobin: 15.5 g/dL (ref 13.0–17.7)
Immature Grans (Abs): 0 x10E3/uL (ref 0.0–0.1)
Immature Granulocytes: 0 %
Lymphocytes Absolute: 2 x10E3/uL (ref 0.7–3.1)
Lymphs: 28 %
MCH: 32.2 pg (ref 26.6–33.0)
MCHC: 33.5 g/dL (ref 31.5–35.7)
MCV: 96 fL (ref 79–97)
Monocytes Absolute: 0.6 x10E3/uL (ref 0.1–0.9)
Monocytes: 8 %
Neutrophils Absolute: 4.1 x10E3/uL (ref 1.4–7.0)
Neutrophils: 59 %
Platelets: 292 x10E3/uL (ref 150–450)
RBC: 4.82 x10E6/uL (ref 4.14–5.80)
RDW: 12.1 % (ref 11.6–15.4)
WBC: 6.9 x10E3/uL (ref 3.4–10.8)

## 2023-06-10 LAB — LDL CHOLESTEROL, DIRECT: LDL Direct: 58 mg/dL (ref 0–99)

## 2023-06-10 LAB — COMPREHENSIVE METABOLIC PANEL WITH GFR
ALT: 27 IU/L (ref 0–44)
AST: 18 IU/L (ref 0–40)
Albumin: 4.7 g/dL (ref 3.8–4.9)
Alkaline Phosphatase: 71 IU/L (ref 44–121)
BUN/Creatinine Ratio: 16 (ref 9–20)
BUN: 14 mg/dL (ref 6–24)
Bilirubin Total: 0.6 mg/dL (ref 0.0–1.2)
CO2: 24 mmol/L (ref 20–29)
Calcium: 9.5 mg/dL (ref 8.7–10.2)
Chloride: 101 mmol/L (ref 96–106)
Creatinine, Ser: 0.88 mg/dL (ref 0.76–1.27)
Globulin, Total: 2.2 g/dL (ref 1.5–4.5)
Glucose: 94 mg/dL (ref 70–99)
Potassium: 4.8 mmol/L (ref 3.5–5.2)
Sodium: 140 mmol/L (ref 134–144)
Total Protein: 6.9 g/dL (ref 6.0–8.5)
eGFR: 100 mL/min/1.73

## 2023-09-06 ENCOUNTER — Telehealth: Payer: Self-pay | Admitting: Pharmacy Technician

## 2023-09-06 ENCOUNTER — Other Ambulatory Visit (HOSPITAL_COMMUNITY): Payer: Self-pay

## 2023-09-06 ENCOUNTER — Other Ambulatory Visit: Payer: Self-pay | Admitting: Cardiology

## 2023-09-06 MED ORDER — TELMISARTAN 20 MG PO TABS: 20.0000 mg | ORAL_TABLET | Freq: Every day | ORAL | 3 refills | Status: AC

## 2023-09-06 NOTE — Telephone Encounter (Signed)
 Please complete a PA for Telmisartan .  Thank You Melene PEAK

## 2023-09-06 NOTE — Telephone Encounter (Signed)
   I called [facility] and they said its filled at 25.00

## 2023-10-03 ENCOUNTER — Encounter: Payer: Self-pay | Admitting: Medical

## 2023-10-03 ENCOUNTER — Ambulatory Visit (INDEPENDENT_AMBULATORY_CARE_PROVIDER_SITE_OTHER): Admitting: Medical

## 2023-10-03 ENCOUNTER — Ambulatory Visit (HOSPITAL_BASED_OUTPATIENT_CLINIC_OR_DEPARTMENT_OTHER)
Admission: RE | Admit: 2023-10-03 | Discharge: 2023-10-03 | Disposition: A | Discharge status: Home / Self Care | Source: Ambulatory Visit | Attending: Medical | Admitting: Medical

## 2023-10-03 VITALS — BP 138/82 | HR 69 | Resp 16 | Ht 73.0 in | Wt 178.8 lb

## 2023-10-03 DIAGNOSIS — Z125 Encounter for screening for malignant neoplasm of prostate: Secondary | ICD-10-CM | POA: Diagnosis not present

## 2023-10-03 DIAGNOSIS — Z23 Encounter for immunization: Secondary | ICD-10-CM

## 2023-10-03 DIAGNOSIS — R739 Hyperglycemia, unspecified: Secondary | ICD-10-CM | POA: Diagnosis not present

## 2023-10-03 DIAGNOSIS — Z13220 Encounter for screening for lipoid disorders: Secondary | ICD-10-CM | POA: Diagnosis not present

## 2023-10-03 DIAGNOSIS — R0781 Pleurodynia: Secondary | ICD-10-CM

## 2023-10-03 DIAGNOSIS — Z0000 Encounter for general adult medical examination without abnormal findings: Secondary | ICD-10-CM | POA: Diagnosis not present

## 2023-10-03 DIAGNOSIS — G8929 Other chronic pain: Secondary | ICD-10-CM

## 2023-10-03 DIAGNOSIS — R972 Elevated prostate specific antigen [PSA]: Secondary | ICD-10-CM

## 2023-10-03 DIAGNOSIS — M546 Pain in thoracic spine: Secondary | ICD-10-CM

## 2023-10-03 DIAGNOSIS — Z1283 Encounter for screening for malignant neoplasm of skin: Secondary | ICD-10-CM

## 2023-10-03 DIAGNOSIS — L989 Disorder of the skin and subcutaneous tissue, unspecified: Secondary | ICD-10-CM

## 2023-10-03 LAB — COMPREHENSIVE METABOLIC PANEL WITH GFR
ALT: 22 U/L (ref 0–53)
AST: 19 U/L (ref 0–37)
Albumin: 4.9 g/dL (ref 3.5–5.2)
Alkaline Phosphatase: 102 U/L (ref 39–117)
BUN: 14 mg/dL (ref 6–23)
CO2: 26 meq/L (ref 19–32)
Calcium: 9.7 mg/dL (ref 8.4–10.5)
Chloride: 101 meq/L (ref 96–112)
Creatinine, Ser: 0.84 mg/dL (ref 0.40–1.50)
GFR: 96.74 mL/min (ref >60.00)
Glucose, Bld: 102 mg/dL — ABNORMAL HIGH (ref 70–99)
Potassium: 4.7 meq/L (ref 3.5–5.1)
Sodium: 139 meq/L (ref 135–145)
Total Bilirubin: 0.7 mg/dL (ref 0.2–1.2)
Total Protein: 7.3 g/dL (ref 6.0–8.3)

## 2023-10-03 LAB — CBC WITH DIFFERENTIAL/PLATELET
Basophils Absolute: 0.1 K/uL (ref 0.0–0.1)
Basophils Relative: 1.1 % (ref 0.0–3.0)
Eosinophils Absolute: 0.2 K/uL (ref 0.0–0.7)
Eosinophils Relative: 2.4 % (ref 0.0–5.0)
HCT: 46.2 % (ref 39.0–52.0)
Hemoglobin: 15.6 g/dL (ref 13.0–17.0)
Lymphocytes Relative: 28.7 % (ref 12.0–46.0)
Lymphs Abs: 2.2 K/uL (ref 0.7–4.0)
MCHC: 33.9 g/dL (ref 30.0–36.0)
MCV: 93.5 fl (ref 78.0–100.0)
Monocytes Absolute: 0.6 K/uL (ref 0.1–1.0)
Monocytes Relative: 7.5 % (ref 3.0–12.0)
Neutro Abs: 4.5 K/uL (ref 1.4–7.7)
Neutrophils Relative %: 60.3 % (ref 43.0–77.0)
Platelets: 281 K/uL (ref 150.0–400.0)
RBC: 4.93 Mil/uL (ref 4.22–5.81)
RDW: 13.1 % (ref 11.5–15.5)
WBC: 7.5 K/uL (ref 4.0–10.5)

## 2023-10-03 LAB — LIPID PANEL
Cholesterol: 155 mg/dL (ref 0–200)
HDL: 79.8 mg/dL (ref >39.00)
LDL Cholesterol: 46 mg/dL (ref 0–99)
NonHDL: 75.26
Total CHOL/HDL Ratio: 2
Triglycerides: 146 mg/dL (ref 0.0–149.0)
VLDL: 29.2 mg/dL (ref 0.0–40.0)

## 2023-10-03 LAB — PSA: PSA: 2.67 ng/mL (ref 0.10–4.00)

## 2023-10-03 LAB — HEMOGLOBIN A1C: Hgb A1c MFr Bld: 5.7 % (ref 4.6–6.5)

## 2023-10-03 MED ORDER — DICLOFENAC SODIUM 75 MG PO TBEC: 75.0000 mg | DELAYED_RELEASE_TABLET | Freq: Two times a day (BID) | ORAL | 0 refills | Status: DC

## 2023-10-03 NOTE — Patient Instructions (Addendum)
 For you wellness exam today I have ordered cbc, cmp, psa lipid panel, ua and hiv.  Vaccine given today tdap and shingrix .  Recommend exercise and healthy diet.  We will let you know lab results as they come in.  Follow up date appointment will be determined after lab review.    Left upper back pain, possible musculoskeletal or pleuritic origin Chronic dull pressure in the left upper back, worsens with deep breathing, suggesting pleuritic component. Differential includes musculoskeletal versus pleuritic pain. Tylenol  provides minimal relief, heating pad used. - Order chest x-ray to evaluate for pleuritic involvement. - Prescribe diclofenac  for 5-7 days to assess response in case of musculoskeletal pain   Preventive Care 89-44 Years Old, Male Preventive care refers to lifestyle choices and visits with your health care provider that can promote health and wellness. Preventive care visits are also called wellness exams. What can I expect for my preventive care visit? Counseling During your preventive care visit, your health care provider may ask about your: Medical history, including: Past medical problems. Family medical history. Current health, including: Emotional well-being. Home life and relationship well-being. Sexual activity. Lifestyle, including: Alcohol, nicotine or tobacco, and drug use. Access to firearms. Diet, exercise, and sleep habits. Safety issues such as seatbelt and bike helmet use. Sunscreen use. Work and work Astronomer. Physical exam Your health care provider will check your: Height and weight. These may be used to calculate your BMI (body mass index). BMI is a measurement that tells if you are at a healthy weight. Waist circumference. This measures the distance around your waistline. This measurement also tells if you are at a healthy weight and may help predict your risk of certain diseases, such as type 2 diabetes and high blood pressure. Heart rate and  blood pressure. Body temperature. Skin for abnormal spots. What immunizations do I need?  Vaccines are usually given at various ages, according to a schedule. Your health care provider will recommend vaccines for you based on your age, medical history, and lifestyle or other factors, such as travel or where you work. What tests do I need? Screening Your health care provider may recommend screening tests for certain conditions. This may include: Lipid and cholesterol levels. Diabetes screening. This is done by checking your blood sugar (glucose) after you have not eaten for a while (fasting). Hepatitis B test. Hepatitis C test. HIV (human immunodeficiency virus) test. STI (sexually transmitted infection) testing, if you are at risk. Lung cancer screening. Prostate cancer screening. Colorectal cancer screening. Talk with your health care provider about your test results, treatment options, and if necessary, the need for more tests. Follow these instructions at home: Eating and drinking  Eat a diet that includes fresh fruits and vegetables, whole grains, lean protein, and low-fat dairy products. Take vitamin and mineral supplements as recommended by your health care provider. Do not drink alcohol if your health care provider tells you not to drink. If you drink alcohol: Limit how much you have to 0-2 drinks a day. Know how much alcohol is in your drink. In the [place] one drink equals one 12 oz bottle of beer (355 mL), one 5 oz glass of wine (148 mL), or one 1 oz glass of hard liquor (44 mL). Lifestyle Brush your teeth every morning and night with fluoride toothpaste. Floss one time each day. Exercise for at least 30 minutes 5 or more days each week. Do not use any products that contain nicotine or tobacco. These products include cigarettes, chewing tobacco,  and vaping devices, such as e-cigarettes. If you need help quitting, ask your health care provider. Do not use drugs. If you are  sexually active, practice safe sex. Use a condom or other form of protection to prevent STIs. Take aspirin  only as told by your health care provider. Make sure that you understand how much to take and what form to take. Work with your health care provider to find out whether it is safe and beneficial for you to take aspirin  daily. Find healthy ways to manage stress, such as: Meditation, yoga, or listening to music. Journaling. Talking to a trusted person. Spending time with friends and family. Minimize exposure to UV radiation to reduce your risk of skin cancer. Safety Always wear your seat belt while driving or riding in a vehicle. Do not drive: If you have been drinking alcohol. Do not ride with someone who has been drinking. When you are tired or distracted. While texting. If you have been using any mind-altering substances or drugs. Wear a helmet and other protective equipment during sports activities. If you have firearms in your house, make sure you follow all gun safety procedures. What's next? Go to your health care provider once a year for an annual wellness visit. Ask your health care provider how often you should have your eyes and teeth checked. Stay up to date on all vaccines. This information is not intended to replace advice given to you by your health care provider. Make sure you discuss any questions you have with your health care provider. Document Revised: 08/19/2020 Document Reviewed: 08/19/2020 Elsevier Patient Education  2024 [employer].

## 2023-10-03 NOTE — Progress Notes (Signed)
 Subjective:    Patient ID: Tim Strickland, male    DOB: 03-19-1965, 58 y.o.   MRN: [id number]  HPI Here for wellness exam.    Pt works Holiday representative. Pt eats healthy. 2-3 beers a day. Non smoker.    Hx of htn, high cholesterol, CAD and elevated sugar.(Pt has seen cardiologist in April)   Discussed shingrix  vaccine today. Will get tdap today as well.  Discussed pcv 20 vaccine as well. He decides will get when gets 2nd shringrix  flu vaccine advised can get in the fall.   Pt up to date on colonoscopy.   Pt has history of elevated wbc at time of cabbage in 2020.    Sugar was elevated in past.   Tim Strickland is a 58 year old male with asthma who presents with persistent upper back pressure.  He has been experiencing a persistent dull pressure in the left upper back, specifically in the lower thoracic region medial to the scapula, for the past two to three months. The sensation is described as feeling like 'a little stone in my shirt' and is present constantly, but worsens with deep breathing. No preceding injury to the area is reported.  He has a history of asthma and notes that the sensation is more pronounced with deep breaths, describing it as a tighter, more intense sensation. He has been managing the discomfort with Tylenol , which does not fully alleviate the pressure. He has not tried any other medications beyond Tylenol .  He uses a heating pad in the area to help with muscle tiredness, especially when trying to sleep. He has full range of motion in his shoulder and does not associate the current symptoms with his past experience of frozen shoulder. No exacerbation of symptoms with thoracic twisting or scapular movement.   Review of Systems  Constitutional:  Negative for chills, fatigue and fever.  HENT:  Negative for congestion, ear discharge and ear pain.   Respiratory:  Negative for cough, chest tightness, wheezing and stridor.        See hpi  Cardiovascular:   Negative for chest pain and palpitations.  Gastrointestinal:  Negative for anal bleeding, constipation and nausea.  Genitourinary:  Negative for dysuria and frequency.  Musculoskeletal:  Negative for back pain, joint swelling and neck pain.       See hpi  Skin:  Negative for rash.       See hpi  Neurological:  Negative for dizziness, seizures, syncope and headaches.  Hematological:  Negative for adenopathy. Does not bruise/bleed easily.  Psychiatric/Behavioral:  Negative for behavioral problems, decreased concentration, hallucinations and sleep disturbance. The patient is not hyperactive.     Past Medical History:  Diagnosis Date   Asthma 12/05/2008   Qualifier: Diagnosis of  By: Tish MD, William   Onset @ age 34 after environmental exposures (home built in 40s). No  maintenance agents; Rx: avoidance of triggers (paint fumes, dust) Singulair  & albuterol  prn     Bursitis of right shoulder 01/15/2014   Ultrasound guided injection 01/15/2014    CAD (coronary artery disease), native coronary artery 01/25/2018   Coronary artery disease    Elevated liver enzymes    PMH of  on Pravachol   Encounter for general adult medical examination with abnormal findings 11/04/2016   Environmental asthma    Familial hyperlipidemia 12/05/2008   Qualifier: Diagnosis of  By: Tish MD, Elsie SINE Lipoprofile 2006: LDL 162 ( 1985/ 1351), HDL 51, TG 108.  NMR Lipoprofile  01/2014 : LDL 69 ( 885 / 601  ),  HDL 62 , Triglycerides 233 . LP-IR 62. LDL goal= < 110, ideally < 80. Father MI @ before 91; PGF MI in 52s. PGM CVA in 60s Note: previously on Pravastatin 20 mg ; stopped because  mild hepatic enzyme rise. [facility] Panel 01/2011: NOT h   HTN (hypertension) 03/18/2018   Hyperglycemia    A1c 5.2% in 2010   Hyperlipidemia    Hypertension    Keloid of skin 07/24/2018   Left wrist tendinitis 02/23/2016   Meralgia paraesthetica, left 04/19/2018   Paresthesia 07/24/2018   Right rotator cuff tear 04/26/2016    Injected A 17 2018.   S/P CABG x 5    RIMA to LAD LIMA to Diagonal 2 SVG to Diagonal 1 Sequential SVG to PD and PLB    Shoulder impingement syndrome, right 02/23/2016   Synovitis of toe 02/23/2016     Social History   Socioeconomic History   Marital status: Married    Spouse name: Not on file   Number of children: 2   Years of education: 16   Highest education level: Bachelor's degree (e.g., BA, AB, BS)  Occupational History   Occupation: Remodler  Tobacco Use   Smoking status: Never    Passive exposure: Never   Smokeless tobacco: Never  Vaping Use   Vaping status: Never Used  Substance and Sexual Activity   Alcohol use: Yes    Alcohol/week: 18.0 standard drinks of alcohol    Types: 18 Cans of beer per week    Comment: 03/16/2018 2-3 beers daily   Drug use: Not Currently   Sexual activity: Yes  Other Topics Concern   Not on file  Social History Narrative   Not on file   Social Drivers of Health   Financial Resource Strain: Low Risk  (10/02/2023)   Overall Financial Resource Strain (CARDIA)    Difficulty of Paying Living Expenses: Not hard at all  Food Insecurity: No Food Insecurity (10/02/2023)   Hunger Vital Sign    Worried About Running Out of Food in the Last Year: Never true    Ran Out of Food in the Last Year: Never true  Transportation Needs: No Transportation Needs (10/02/2023)   PRAPARE - Administrator, Civil Service (Medical): No    Lack of Transportation (Non-Medical): No  Physical Activity: Insufficiently Active (10/02/2023)   Exercise Vital Sign    Days of Exercise per Week: 2 days    Minutes of Exercise per Session: 20 min  Stress: No Stress Concern Present (10/02/2023)   [employer] of Occupational Health - Occupational Stress Questionnaire    Feeling of Stress: Only a little  Social Connections: Not on file  Intimate Partner Violence: Not on file    Past Surgical History:  Procedure Laterality Date   APPENDECTOMY     CORONARY  ARTERY BYPASS GRAFT N/A 03/19/2018   Procedure: CORONARY ARTERY BYPASS GRAFTING (CABG), ON PUMP, TIMES FIVE, USING BILATERAL INTERNAL MAMMARY ARTERIES AND ENDOSCOPICALLY HARVESTED RIGHT GREATER SAPHENOUS VEIN;  Surgeon: Lucas Dorise POUR, MD;  Location: [facility] OR;  Service: Open Heart Surgery;  Laterality: N/A;   LEFT HEART CATH AND CORONARY ANGIOGRAPHY N/A 03/16/2018   Procedure: LEFT HEART CATH AND CORONARY ANGIOGRAPHY;  Surgeon: Darron Deatrice LABOR, MD;  Location: [facility] INVASIVE CV LAB;  Service: Cardiovascular;  Laterality: N/A;   TEE WITHOUT CARDIOVERSION N/A 03/19/2018   Procedure: TRANSESOPHAGEAL ECHOCARDIOGRAM (TEE);  Surgeon: Lucas Dorise POUR, MD;  Location: [facility] OR;  Service: Open Heart Surgery;  Laterality: N/A;   TONSILLECTOMY     VASECTOMY     WISDOM TOOTH EXTRACTION      Family History  Problem Relation Age of Onset   Healthy Mother    Autoimmune disease Mother    Heart attack Father        pre 32   Diabetes Sister        DM 1   Cancer Paternal Uncle 12       bone    Mental illness Maternal Grandmother        breakdown   Colon polyps Maternal Grandfather    Stroke Paternal Grandmother    Heart attack Paternal Grandfather         in 39s   Gout Paternal Grandfather    Colon cancer Neg Hx    Esophageal cancer Neg Hx    Rectal cancer Neg Hx    Stomach cancer Neg Hx     Allergies  Allergen Reactions   Pravastatin Other (See Comments)    04/03/2009: AST 51 and ALT 101 on pravastatin 20 mg daily. LDL at that time was 192.6.    Current Outpatient Medications on File Prior to Visit  Medication Sig Dispense Refill   albuterol  (PROVENTIL  HFA;VENTOLIN  HFA) 108 (90 Base) MCG/ACT inhaler Inhale 2 puffs into the lungs every 6 (six) hours as needed for wheezing or shortness of breath. 3 Inhaler 0   aspirin  EC 81 MG tablet Take 1 tablet (81 mg total) by mouth daily. 90 tablet 3   cyclobenzaprine  (FLEXERIL ) 10 MG tablet 1 tab po q hs prn muscle spams/scapula pain 30 tablet 0   Evolocumab   (REPATHA  SURECLICK) 140 MG/ML SOAJ INJECT 140 MG INTO THE SKIN EVERY 14 (FOURTEEN) DAYS. INJECT 140 MG INTO THE SKIN EVERY 14 DAYS. 6 mL 1   metoprolol  tartrate (LOPRESSOR ) 25 MG tablet TAKE 1 TABLET (25 MG TOTAL) BY MOUTH DAILY. 90 tablet 2   nitroGLYCERIN  (NITROSTAT ) 0.4 MG SL tablet Place 0.4 mg under the tongue every 5 (five) minutes as needed for chest pain.     telmisartan  (MICARDIS ) 20 MG tablet Take 1 tablet (20 mg total) by mouth daily. 90 tablet 3   No current facility-administered medications on file prior to visit.    BP 138/82   Pulse 69   Resp 16   Ht 6' 1 (1.854 m)   Wt 178 lb 12.8 oz (81.1 kg)   SpO2 98%   BMI 23.59 kg/m         Objective:   Physical Exam  General Mental Status- Alert. General Appearance- Not in acute distress.   Skin General: Color- Normal Color. Moisture- Normal Moisture.  Neck No JVD.  Chest and Lung Exam Auscultation: Breath Sounds:-CTA  Cardiovascular Auscultation:Rythm- RRR Murmurs & Other Heart Sounds:Auscultation of the heart reveals- No Murmurs.  Abdomen Inspection:-Inspeection Normal. Palpation/Percussion:Note:No mass. Palpation and Percussion of the abdomen reveal- Non Tender, Non Distended + BS, no rebound or guarding.  Neurologic Cranial Nerve exam:- CN III-XII intact(No nystagmus), symmetric smile. Strength:- 5/5 equal and symmetric strength both upper and lower extremities.   Back- mild tender to palpation medial to left upper scapula and area adjacent to spine. No rash, no mass, no vesicles. No skin warmth or induration. Pain in area on deep inspiration.  Legs- calfs symmetric, negative homans signs. No edema.    Assessment & Plan:   Patient Instructions  For you wellness exam today I have ordered cbc,  cmp, psa lipid panel, ua and hiv.  Vaccine given today tdap and shingrix .  Recommend exercise and healthy diet.  We will let you know lab results as they come in.  Follow up date appointment will be  determined after lab review.    Left upper back pain, possible musculoskeletal or pleuritic origin Chronic dull pressure in the left upper back, worsens with deep breathing, suggesting pleuritic component. Differential includes musculoskeletal versus pleuritic pain. Tylenol  provides minimal relief, heating pad used. - Order chest x-ray to evaluate for pleuritic involvement. - Prescribe diclofenac  for 5-7 days to assess response in case of musculoskeletal pain    Dallas Maxwell, PA-C    [phone] charge as did address thoracic left side back pain/pleuritic pain.

## 2023-10-04 ENCOUNTER — Ambulatory Visit: Payer: Self-pay | Admitting: Medical

## 2023-10-04 NOTE — Addendum Note (Signed)
 Addended by: DORINA DALLAS HERO on: 10/04/2023 06:45 AM   Modules accepted: Orders

## 2023-11-29 ENCOUNTER — Other Ambulatory Visit: Payer: Self-pay | Admitting: Cardiology

## 2023-11-29 DIAGNOSIS — I2510 Atherosclerotic heart disease of native coronary artery without angina pectoris: Secondary | ICD-10-CM

## 2023-11-29 DIAGNOSIS — Z951 Presence of aortocoronary bypass graft: Secondary | ICD-10-CM

## 2023-11-29 DIAGNOSIS — E782 Mixed hyperlipidemia: Secondary | ICD-10-CM

## 2024-01-01 ENCOUNTER — Telehealth: Payer: Self-pay

## 2024-01-01 NOTE — Telephone Encounter (Signed)
 Appt scheduled for tomorrow.

## 2024-01-01 NOTE — Telephone Encounter (Signed)
 Initial Comment Caller states the patient made an appointment at her office. In her notes, it states he is having some numbness in his finger. She is trying to get the patient triaged. She works at the [employer]. Translation No Nurse Assessment Nurse: Lesia, RN, Dawna Date/Time (Eastern Time): 01/01/2024 2:01:23 PM Confirm and document reason for call. If symptomatic, describe symptoms. ---Caller states last few days numbness to both hands that comes and goes, more in left, also with neck pain for 1 week. Does the patient have any new or worsening symptoms? ---Yes Will a triage be completed? ---Yes Related visit to physician within the last 2 weeks? ---No Does the PT have any chronic conditions? (i.e. diabetes, asthma, this includes High risk factors for pregnancy, etc.) ---Yes List chronic conditions. ---CABG, HTN, HLD Is this a behavioral health or substance abuse call? ---No Guidelines Guideline Title Affirmed Question Affirmed Notes Nurse Date/Time (Eastern Time) Neck Pain or Stiffness Numbness in an arm or hand (i.e., loss of sensation) Lesia, RN, Dawna 01/01/2024 2:03:36 PM PLEASE NOTE: All timestamps contained within this report are represented as Eastern Standard Time. CONFIDENTIALTY NOTICE: This fax transmission is intended only for the addressee. It contains information that is legally privileged, confidential or otherwise protected from use or disclosure. If you are not the intended recipient, you are strictly prohibited from reviewing, disclosing, copying using or disseminating any of this information or taking any action in reliance on or regarding this information. If you have received this fax in error, please notify us  immediately by telephone so that we can arrange for its return to us . Phone: [phone], Toll-Free: [phone], Fax: [phone] THOMAS_KNIGHT June 19, 1965 Page: 1 of2 CallId: [id number] Disp. Time Titus Time) Disposition Final  User 01/01/2024 2:07:08 PM See PCP within 24 Hours Yes Lesia RN, Dawna Final Disposition 01/01/2024 2:07:08 PM See PCP within 24 Hours Yes Lesia, RN, Dawna Caller Disagree/Comply Comply Caller Understands Yes PreDisposition Call Doctor Care Advice Given Per Guideline SEE PCP WITHIN 24 HOURS: CALL BACK IF: * You become worse CARE ADVICE given per Neck Pain or Stiffness (Adult) guideline. Comments User: Dawna Lesia, RN Date/Time Titus Time): 01/01/2024 2:10:40 PM Caller warm transferred to backline for appt within 24 hours (currently has one for 245 tomorrow). Caller states numbness to bilat hands and moderate neck pain. Referrals REFERRED TO PCP OFFICE

## 2024-01-02 ENCOUNTER — Ambulatory Visit: Payer: Self-pay | Admitting: Medical

## 2024-01-02 ENCOUNTER — Encounter: Payer: Self-pay | Admitting: Medical

## 2024-01-02 ENCOUNTER — Ambulatory Visit (INDEPENDENT_AMBULATORY_CARE_PROVIDER_SITE_OTHER): Admitting: Medical

## 2024-01-02 VITALS — BP 130/80 | HR 69 | Temp 98.1°F | Resp 18 | Ht 73.0 in | Wt 175.0 lb

## 2024-01-02 DIAGNOSIS — M792 Neuralgia and neuritis, unspecified: Secondary | ICD-10-CM | POA: Diagnosis not present

## 2024-01-02 DIAGNOSIS — M542 Cervicalgia: Secondary | ICD-10-CM

## 2024-01-02 DIAGNOSIS — G4482 Headache associated with sexual activity: Secondary | ICD-10-CM

## 2024-01-02 DIAGNOSIS — Z951 Presence of aortocoronary bypass graft: Secondary | ICD-10-CM | POA: Diagnosis not present

## 2024-01-02 DIAGNOSIS — M79602 Pain in left arm: Secondary | ICD-10-CM

## 2024-01-02 DIAGNOSIS — R519 Headache, unspecified: Secondary | ICD-10-CM

## 2024-01-02 DIAGNOSIS — S46812A Strain of other muscles, fascia and tendons at shoulder and upper arm level, left arm, initial encounter: Secondary | ICD-10-CM

## 2024-01-02 LAB — TROPONIN I (HIGH SENSITIVITY): High Sens Troponin I: 4 ng/L (ref 2–17)

## 2024-01-02 MED ORDER — KETOROLAC TROMETHAMINE 30 MG/ML IJ SOLN
30.0000 mg | Freq: Once | INTRAMUSCULAR | Status: AC
  Administered 2024-01-02: 30 mg via INTRAMUSCULAR

## 2024-01-02 MED ORDER — CYCLOBENZAPRINE HCL 5 MG PO TABS: ORAL_TABLET | ORAL | 0 refills | Status: AC

## 2024-01-02 MED ORDER — DICLOFENAC SODIUM 75 MG PO TBEC: 75.0000 mg | DELAYED_RELEASE_TABLET | Freq: Two times a day (BID) | ORAL | 0 refills | Status: AC

## 2024-01-02 NOTE — Patient Instructions (Signed)
 HA associate with sex described brief and thunderclap Headache Experienced a severe thunderclap headache during sexual activity, lasting about an hour on Dec 11, 2023.SABRA Neurologic exam normal, no current headache. CT head without contrast needed to rule out serious conditions. - Order CT head without contrast pending prior authorization. - Advise emergency department evaluation if recurrent severe headache occurs pending prior auth and testing.   Left Periscapular and Neck Pain Recurrent left periscapular and neck pain with tingling in fingers Ssuggestive of radiculopathy. Pain not cardiac in nature, EKG shows no significant changes. Stat troponin level ordered due to history of CABG and atypical symptoms in past- Administer Toradol 30 mg IM for pain relief. - Refill diclofenac  75 mg twice daily for 7 days. - Prescribe Flexeril  5 mg tablet at night as needed. - Order x-ray of the neck to assess joint spaces. - Order stat troponin level. -ekg nsr. No ischemic changes seen. No changes since 06-27-2023.  Coronary Artery Disease (CAD) CAD with CABG times five. No current chest pain or dyspnea. LDL controlled at 46 on Repatha . Blood pressure improved on recheck. EKG shows no significant changes, only nonspecific T wave abnormalities. Stat troponin level ordered due to history. - Order stat troponin level. - Perform EKG and compare with previous results.  Follow up 2 weeks or sooner if needed

## 2024-01-02 NOTE — Progress Notes (Signed)
 Subjective:    Patient ID: Tim Strickland, male    DOB: 30-Aug-1965, 58 y.o.   MRN: [id number]  HPI  Tim Strickland is a 58 year old male who presents with severe headache, left periscapular pain and neck pain.  He experienced a severe 'thunderclap' headache on October 9th or 10th during sexual activity. The headache was intense, rated 10 out of 10, located at the occipital region, and lasted about an hour. No vision changes, no speech difficulties, or weakness were noted during the episode. This headache was a new occurrence for him, and he did not seek immediate medical attention at that time. No ha since and no motor or sensory function deficits/symptoms. He did not seek evaluation night of the head though described as thunderclap?  He has a history of left side neck pain  and scapula pain, initially consulted for in July, with an x-ray taken at that time. The pain in left scapula subsided but has returned, particularly after recent trips to [place]  and [place]. The pain is located in the upper edge of left scapula  and neck area, with tingling in the fingers, especially when sitting. It is exacerbated by poor sleeping positions and tension during travel, radiating down his arm, particularly on the left side, and is associated with a sore neck. He has been using leftover Flexeril  at night for muscle relaxation and previously used diclofenac , which helped alleviate his pain. Previous x-rays of his scapula and chest were negative.  He has a history of coronary artery disease and underwent CABG surgery. He has concerns about his heart but has not experienced typical cardiac symptoms such as chest pain or shortness of breath during exertion. He is currently able to walk 30-45 minutes daily without issues. He is on Repatha  for cholesterol management, with a recent lipid panel showing an LDL of 46.     Review of Systems  Constitutional:  Negative for chills, fatigue and fever.  HENT:   Negative for congestion.   Respiratory:  Negative for cough, chest tightness, shortness of breath and wheezing.   Cardiovascular:  Negative for chest pain.  Gastrointestinal:  Negative for abdominal pain.  Musculoskeletal:  Positive for neck pain.       See hpi  Skin:  Negative for rash.  Neurological:  Negative for dizziness and light-headedness.  Psychiatric/Behavioral:  Negative for behavioral problems and decreased concentration. The patient is not hyperactive.     Past Medical History:  Diagnosis Date   Asthma 12/05/2008   Qualifier: Diagnosis of  By: Tish MD, William   Onset @ age 28 after environmental exposures (home built in 64s). No  maintenance agents; Rx: avoidance of triggers (paint fumes, dust) Singulair  & albuterol  prn     Bursitis of right shoulder 01/15/2014   Ultrasound guided injection 01/15/2014    CAD (coronary artery disease), native coronary artery 01/25/2018   Coronary artery disease    Elevated liver enzymes    PMH of  on Pravachol   Encounter for general adult medical examination with abnormal findings 11/04/2016   Environmental asthma    Familial hyperlipidemia 12/05/2008   Qualifier: Diagnosis of  By: Tish MD, Elsie SINE Lipoprofile 2006: LDL 162 ( 1985/ 1351), HDL 51, TG 108.  NMR Lipoprofile 01/2014 : LDL 69 ( 885 / 601  ),  HDL 62 , Triglycerides 233 . LP-IR 62. LDL goal= < 110, ideally < 80. Father MI @ before 58; PGF MI in 51s. PGM CVA in  60s Note: previously on Pravastatin 20 mg ; stopped because  mild hepatic enzyme rise. [facility] Panel 01/2011: NOT h   HTN (hypertension) 03/18/2018   Hyperglycemia    A1c 5.2% in 2010   Hyperlipidemia    Hypertension    Keloid of skin 07/24/2018   Left wrist tendinitis 02/23/2016   Meralgia paraesthetica, left 04/19/2018   Paresthesia 07/24/2018   Right rotator cuff tear 04/26/2016   Injected A 17 2018.   S/P CABG x 5    RIMA to LAD LIMA to Diagonal 2 SVG to Diagonal 1 Sequential SVG to PD and PLB     Shoulder impingement syndrome, right 02/23/2016   Synovitis of toe 02/23/2016     Social History   Socioeconomic History   Marital status: Married    Spouse name: Not on file   Number of children: 2   Years of education: 16   Highest education level: Bachelor's degree (e.g., BA, AB, BS)  Occupational History   Occupation: Remodler  Tobacco Use   Smoking status: Never    Passive exposure: Never   Smokeless tobacco: Never  Vaping Use   Vaping status: Never Used  Substance and Sexual Activity   Alcohol use: Yes    Alcohol/week: 18.0 standard drinks of alcohol    Types: 18 Cans of beer per week    Comment: 03/16/2018 2-3 beers daily   Drug use: Not Currently   Sexual activity: Yes  Other Topics Concern   Not on file  Social History Narrative   Not on file   Social Drivers of Health   Financial Resource Strain: Low Risk  (01/01/2024)   Overall Financial Resource Strain (CARDIA)    Difficulty of Paying Living Expenses: Not hard at all  Food Insecurity: No Food Insecurity (01/01/2024)   Hunger Vital Sign    Worried About Running Out of Food in the Last Year: Never true    Ran Out of Food in the Last Year: Never true  Transportation Needs: No Transportation Needs (01/01/2024)   PRAPARE - Administrator, Civil Service (Medical): No    Lack of Transportation (Non-Medical): No  Physical Activity: Sufficiently Active (01/01/2024)   Exercise Vital Sign    Days of Exercise per Week: 5 days    Minutes of Exercise per Session: 50 min  Stress: Stress Concern Present (01/01/2024)   [employer] of Occupational Health - Occupational Stress Questionnaire    Feeling of Stress: To some extent  Social Connections: Moderately Integrated (01/01/2024)   Social Connection and Isolation Panel    Frequency of Communication with Friends and Family: More than three times a week    Frequency of Social Gatherings with Friends and Family: Once a week    Attends Religious  Services: 1 to 4 times per year    Active Member of [employer] or Organizations: No    Attends Engineer, Structural: Not on file    Marital Status: Married  Catering Manager Violence: Not on file    Past Surgical History:  Procedure Laterality Date   APPENDECTOMY     CORONARY ARTERY BYPASS GRAFT N/A 03/19/2018   Procedure: CORONARY ARTERY BYPASS GRAFTING (CABG), ON PUMP, TIMES FIVE, USING BILATERAL INTERNAL MAMMARY ARTERIES AND ENDOSCOPICALLY HARVESTED RIGHT GREATER SAPHENOUS VEIN;  Surgeon: Lucas Dorise POUR, MD;  Location: [facility] OR;  Service: Open Heart Surgery;  Laterality: N/A;   LEFT HEART CATH AND CORONARY ANGIOGRAPHY N/A 03/16/2018   Procedure: LEFT HEART CATH AND CORONARY ANGIOGRAPHY;  Surgeon: Darron Deatrice LABOR, MD;  Location: [facility] INVASIVE CV LAB;  Service: Cardiovascular;  Laterality: N/A;   TEE WITHOUT CARDIOVERSION N/A 03/19/2018   Procedure: TRANSESOPHAGEAL ECHOCARDIOGRAM (TEE);  Surgeon: Lucas Dorise POUR, MD;  Location: [facility];  Service: Open Heart Surgery;  Laterality: N/A;   TONSILLECTOMY     VASECTOMY     WISDOM TOOTH EXTRACTION      Family History  Problem Relation Age of Onset   Healthy Mother    Autoimmune disease Mother    Heart attack Father        pre 51   Diabetes Sister        DM 1   Cancer Paternal Uncle 65       bone    Mental illness Maternal Grandmother        breakdown   Colon polyps Maternal Grandfather    Stroke Paternal Grandmother    Heart attack Paternal Grandfather         in 52s   Gout Paternal Grandfather    Colon cancer Neg Hx    Esophageal cancer Neg Hx    Rectal cancer Neg Hx    Stomach cancer Neg Hx     Allergies  Allergen Reactions   Pravastatin Other (See Comments)    04/03/2009: AST 51 and ALT 101 on pravastatin 20 mg daily. LDL at that time was 192.6.    Current Outpatient Medications on File Prior to Visit  Medication Sig Dispense Refill   albuterol  (PROVENTIL  HFA;VENTOLIN  HFA) 108 (90 Base) MCG/ACT inhaler Inhale 2 puffs  into the lungs every 6 (six) hours as needed for wheezing or shortness of breath. 3 Inhaler 0   aspirin  EC 81 MG tablet Take 1 tablet (81 mg total) by mouth daily. 90 tablet 3   cyclobenzaprine  (FLEXERIL ) 10 MG tablet 1 tab po q hs prn muscle spams/scapula pain 30 tablet 0   Evolocumab  (REPATHA  SURECLICK) 140 MG/ML SOAJ INJECT 140 MG INTO THE SKIN EVERY 14 (FOURTEEN) DAYS. INJECT 140 MG INTO THE SKIN EVERY 14 DAYS. 6 mL 3   metoprolol  tartrate (LOPRESSOR ) 25 MG tablet TAKE 1 TABLET (25 MG TOTAL) BY MOUTH DAILY. 90 tablet 2   nitroGLYCERIN  (NITROSTAT ) 0.4 MG SL tablet Place 0.4 mg under the tongue every 5 (five) minutes as needed for chest pain.     telmisartan  (MICARDIS ) 20 MG tablet Take 1 tablet (20 mg total) by mouth daily. 90 tablet 3   No current facility-administered medications on file prior to visit.    BP 130/80   Pulse 69   Temp 98.1 F (36.7 C)   Resp 18   Ht 6' 1 (1.854 m)   Wt 175 lb (79.4 kg)   SpO2 96%   BMI 23.09 kg/m          Objective:   Physical Exam  General Mental Status- Alert. General Appearance- Not in acute distress.   Skin General: Color- Normal Color. Moisture- Normal Moisture.  Neck  No JVD. Left side trapezius tenderness to palption.  Chest and Lung Exam Auscultation: Breath Sounds:-Normal.  Cardiovascular Auscultation:Rythm- Regular. Murmurs & Other Heart Sounds:Auscultation of the heart reveals- No Murmurs.  Abdomen Inspection:-Inspeection Normal. Palpation/Percussion:Note:No mass. Palpation and Percussion of the abdomen reveal- Non Tender, Non Distended + BS, no rebound or guarding.   Neurologic Cranial Nerve exam:- CN III-XII intact(No nystagmus), symmetric smile. Drift Test:- No drift. Finger to Nose:- Normal/Intact Strength:- 5/5 equal and symmetric strength both upper and lower extremities. Left upper ext- normal  sensation and normal movement.   Back- left side persicapular point tenderness to palpation.    Assessment &  Plan:  [phone]   HA associate with sex described brief and thunderclap Headache Experienced a severe thunderclap headache during sexual activity, lasting about an hour on Dec 11, 2023.SABRA Neurologic exam normal, no current headache. CT head without contrast needed to rule out serious conditions. - Order CT head without contrast pending prior authorization. - Advise emergency department evaluation if recurrent severe headache occurs pending prior auth and testing.   Left Periscapular and Neck Pain Recurrent left periscapular and neck pain with tingling in fingers Ssuggestive of radiculopathy. Pain not cardiac in nature, EKG shows no significant changes. Stat troponin level ordered due to history of CABG and atypical symptoms in past- Administer Toradol 30 mg IM for pain relief. - Refill diclofenac  75 mg twice daily for 7 days. - Prescribe Flexeril  5 mg tablet at night as needed. - Order x-ray of the neck to assess joint spaces. - Order stat troponin level. -ekg nsr. No ischemic changes seen. No changes since 06-27-2023.  Coronary Artery Disease (CAD) CAD with CABG times five. No current chest pain or dyspnea. LDL controlled at 46 on Repatha . Blood pressure improved on recheck. EKG shows no significant changes, only nonspecific T wave abnormalities. Stat troponin level ordered due to history. - Order stat troponin level. - Perform EKG and compare with previous results.  Follow up 2 weeks or sooner if needed  Dallas Maxwell, PA-C   I personally spent a total of 50 minutes in the care of the patient today including performing a medically appropriate exam/evaluation, counseling and educating, placing orders, and documenting clinical information in the EHR.

## 2024-01-03 ENCOUNTER — Ambulatory Visit (HOSPITAL_BASED_OUTPATIENT_CLINIC_OR_DEPARTMENT_OTHER)
Admission: RE | Admit: 2024-01-03 | Discharge: 2024-01-03 | Disposition: A | Discharge status: Home / Self Care | Source: Ambulatory Visit | Attending: Medical | Admitting: Medical

## 2024-01-03 DIAGNOSIS — R519 Headache, unspecified: Secondary | ICD-10-CM | POA: Diagnosis present

## 2024-01-03 DIAGNOSIS — M542 Cervicalgia: Secondary | ICD-10-CM | POA: Insufficient documentation

## 2024-01-04 MED ORDER — METHYLPREDNISOLONE 4 MG PO TABS: ORAL_TABLET | ORAL | 0 refills | Status: AC

## 2024-01-04 NOTE — Addendum Note (Signed)
 Addended by: DORINA DALLAS HERO on: 01/04/2024 04:49 PM   Modules accepted: Orders
